# Patient Record
Sex: Male | Born: 1935 | Race: Black or African American | Hispanic: No | Smoking: Former smoker
Health system: Southern US, Community
[De-identification: ages and names within clinical notes are randomized; demographics above are authoritative.]

## PROBLEM LIST (undated history)

## (undated) DIAGNOSIS — I10 Essential (primary) hypertension: Secondary | ICD-10-CM

## (undated) DIAGNOSIS — R339 Retention of urine, unspecified: Secondary | ICD-10-CM

## (undated) HISTORY — PX: FRACTURE SURGERY: SHX138

---

## 2002-12-05 ENCOUNTER — Emergency Department (HOSPITAL_COMMUNITY): Admission: EM | Admit: 2002-12-05 | Discharge: 2002-12-05 | Payer: Self-pay | Admitting: Emergency Medicine

## 2004-08-13 ENCOUNTER — Ambulatory Visit: Payer: Self-pay | Admitting: Internal Medicine

## 2004-09-06 ENCOUNTER — Ambulatory Visit: Payer: Self-pay | Admitting: Internal Medicine

## 2005-08-22 ENCOUNTER — Ambulatory Visit: Payer: Self-pay | Admitting: Internal Medicine

## 2006-10-06 ENCOUNTER — Ambulatory Visit: Payer: Self-pay | Admitting: Internal Medicine

## 2006-10-06 LAB — CONVERTED CEMR LAB
CO2: 27 meq/L (ref 19–32)
Calcium: 9.8 mg/dL (ref 8.4–10.5)
Chloride: 102 meq/L (ref 96–112)
Creatinine, Ser: 1.4 mg/dL (ref 0.4–1.5)
GFR calc Af Amer: 62 mL/min
Glucose, Bld: 82 mg/dL (ref 70–99)

## 2006-12-05 ENCOUNTER — Inpatient Hospital Stay (HOSPITAL_COMMUNITY): Admission: EM | Admit: 2006-12-05 | Discharge: 2006-12-16 | Payer: Self-pay | Admitting: Emergency Medicine

## 2006-12-09 ENCOUNTER — Ambulatory Visit: Payer: Self-pay | Admitting: Internal Medicine

## 2007-10-03 ENCOUNTER — Encounter: Payer: Self-pay | Admitting: *Deleted

## 2007-10-03 DIAGNOSIS — I10 Essential (primary) hypertension: Secondary | ICD-10-CM | POA: Insufficient documentation

## 2007-10-03 DIAGNOSIS — IMO0002 Reserved for concepts with insufficient information to code with codable children: Secondary | ICD-10-CM | POA: Insufficient documentation

## 2007-10-03 DIAGNOSIS — M1710 Unilateral primary osteoarthritis, unspecified knee: Secondary | ICD-10-CM

## 2007-10-03 DIAGNOSIS — Z9889 Other specified postprocedural states: Secondary | ICD-10-CM | POA: Insufficient documentation

## 2008-01-22 ENCOUNTER — Ambulatory Visit: Payer: Self-pay | Admitting: Internal Medicine

## 2008-01-22 DIAGNOSIS — S72009A Fracture of unspecified part of neck of unspecified femur, initial encounter for closed fracture: Secondary | ICD-10-CM | POA: Insufficient documentation

## 2008-01-22 DIAGNOSIS — H6120 Impacted cerumen, unspecified ear: Secondary | ICD-10-CM

## 2008-01-22 LAB — CONVERTED CEMR LAB
Basophils Absolute: 0 10*3/uL (ref 0.0–0.1)
Basophils Relative: 0.4 % (ref 0.0–1.0)
Calcium: 9.5 mg/dL (ref 8.4–10.5)
Creatinine, Ser: 1.5 mg/dL (ref 0.4–1.5)
Eosinophils Absolute: 0.1 10*3/uL (ref 0.0–0.7)
GFR calc Af Amer: 57 mL/min
HCT: 37.5 % — ABNORMAL LOW (ref 39.0–52.0)
Hemoglobin: 12.6 g/dL — ABNORMAL LOW (ref 13.0–17.0)
MCHC: 33.6 g/dL (ref 30.0–36.0)
MCV: 93.7 fL (ref 78.0–100.0)
Monocytes Absolute: 0.6 10*3/uL (ref 0.1–1.0)
Neutro Abs: 3.6 10*3/uL (ref 1.4–7.7)
RBC: 4 M/uL — ABNORMAL LOW (ref 4.22–5.81)
RDW: 13.6 % (ref 11.5–14.6)
Sodium: 139 meq/L (ref 135–145)

## 2008-01-26 ENCOUNTER — Encounter: Payer: Self-pay | Admitting: Internal Medicine

## 2008-05-03 ENCOUNTER — Encounter: Payer: Self-pay | Admitting: Internal Medicine

## 2008-05-05 ENCOUNTER — Telehealth: Payer: Self-pay | Admitting: Internal Medicine

## 2008-11-23 ENCOUNTER — Telehealth: Payer: Self-pay | Admitting: Internal Medicine

## 2009-02-07 ENCOUNTER — Telehealth: Payer: Self-pay | Admitting: Internal Medicine

## 2009-07-27 ENCOUNTER — Ambulatory Visit: Payer: Self-pay | Admitting: Internal Medicine

## 2009-07-27 DIAGNOSIS — R634 Abnormal weight loss: Secondary | ICD-10-CM

## 2009-07-27 LAB — CONVERTED CEMR LAB
Basophils Absolute: 0 10*3/uL (ref 0.0–0.1)
Calcium: 10.4 mg/dL (ref 8.4–10.5)
Eosinophils Relative: 1.4 % (ref 0.0–5.0)
GFR calc non Af Amer: 73.18 mL/min (ref >60)
Glucose, Bld: 90 mg/dL (ref 70–99)
HCT: 37.1 % — ABNORMAL LOW (ref 39.0–52.0)
Hemoglobin: 12.4 g/dL — ABNORMAL LOW (ref 13.0–17.0)
Lymphocytes Relative: 19.9 % (ref 12.0–46.0)
Lymphs Abs: 1.2 10*3/uL (ref 0.7–4.0)
Monocytes Relative: 8.8 % (ref 3.0–12.0)
Neutro Abs: 4.1 10*3/uL (ref 1.4–7.7)
Platelets: 271 10*3/uL (ref 150.0–400.0)
Potassium: 4.6 meq/L (ref 3.5–5.1)
RDW: 13.6 % (ref 11.5–14.6)
Sodium: 139 meq/L (ref 135–145)
TSH: 1.45 microintl units/mL (ref 0.35–5.50)
WBC: 5.9 10*3/uL (ref 4.5–10.5)

## 2009-08-25 ENCOUNTER — Telehealth: Payer: Self-pay | Admitting: Internal Medicine

## 2009-09-14 ENCOUNTER — Encounter: Payer: Self-pay | Admitting: Internal Medicine

## 2010-08-02 ENCOUNTER — Ambulatory Visit: Admit: 2010-08-02 | Payer: Self-pay | Admitting: Internal Medicine

## 2010-08-28 NOTE — Progress Notes (Signed)
Summary: AVAPRO  Phone Note Call from Patient   Summary of Call: Pt left vm that he dropped off a letter regarding one of his rx's. Have you see this?  Initial call taken by: Lamar Sprinkles, CMA,  August 25, 2009 3:05 PM  Follow-up for Phone Call        Nope Follow-up by: Jacques Navy MD,  August 25, 2009 3:35 PM  Additional Follow-up for Phone Call Additional follow up Details #1::        Per pt, he dropped off a letter that states a medcation was not covered. Must be avapro, need to call pharm and confirm if med needs PA..................Marland KitchenLamar Sprinkles, CMA  August 25, 2009 6:23 PM     Additional Follow-up for Phone Call Additional follow up Details #2::    left message on voicemail for pharmacy to send me the PA/rejected clain for Avapro.Lucious Groves,  August 28, 2009 11:40 AM  I rec'd fax that patient plan prefers Losartan, Benicar, and Diovan. Would you like to change meds or initiate PA? Please advise. Lucious Groves  August 29, 2009 8:57 AM   Additional Follow-up for Phone Call Additional follow up Details #3:: Details for Additional Follow-up Action Taken: wilol change to losartan 50mg  once daily , # 30 , refill as needed   pt informed in detatil. Margaret Pyle, CMA  August 29, 2009 10:31 AM  Additional Follow-up by: Jacques Navy MD,  August 29, 2009 9:01 AM  New/Updated Medications: LOSARTAN POTASSIUM 50 MG TABS (LOSARTAN POTASSIUM) 1 by mouth once daily Prescriptions: LOSARTAN POTASSIUM 50 MG TABS (LOSARTAN POTASSIUM) 1 by mouth once daily  #30 x 11   Entered by:   Margaret Pyle, CMA   Authorized by:   Jacques Navy MD   Signed by:   Margaret Pyle, CMA on 08/29/2009   Method used:   Faxed to ...       [facility] [address] [phone]* (retail)       [address]       [place], [place]  [address]       Ph: [phone] or [phone]       Fax: [phone]   RxID:   [id number]

## 2010-08-28 NOTE — Medication Information (Signed)
Summary: Alternative/AARP MedicareComplete  Alternative/AARP MedicareComplete   Imported By: Lester  10/11/2009 08:39:10  _____________________________________________________________________  External Attachment:    Type:   Image     Comment:   External Document

## 2010-09-13 ENCOUNTER — Encounter (INDEPENDENT_AMBULATORY_CARE_PROVIDER_SITE_OTHER): Payer: Self-pay | Admitting: *Deleted

## 2010-09-13 ENCOUNTER — Encounter: Payer: Self-pay | Admitting: Internal Medicine

## 2010-09-13 ENCOUNTER — Ambulatory Visit (INDEPENDENT_AMBULATORY_CARE_PROVIDER_SITE_OTHER): Payer: Medicare Other | Admitting: Internal Medicine

## 2010-09-13 ENCOUNTER — Other Ambulatory Visit: Payer: Medicare Other

## 2010-09-13 ENCOUNTER — Other Ambulatory Visit: Payer: Self-pay | Admitting: Internal Medicine

## 2010-09-13 DIAGNOSIS — M1710 Unilateral primary osteoarthritis, unspecified knee: Secondary | ICD-10-CM

## 2010-09-13 DIAGNOSIS — Z0000 Encounter for general adult medical examination without abnormal findings: Secondary | ICD-10-CM

## 2010-09-13 DIAGNOSIS — I10 Essential (primary) hypertension: Secondary | ICD-10-CM

## 2010-09-13 DIAGNOSIS — S72009A Fracture of unspecified part of neck of unspecified femur, initial encounter for closed fracture: Secondary | ICD-10-CM

## 2010-09-13 DIAGNOSIS — R634 Abnormal weight loss: Secondary | ICD-10-CM

## 2010-09-13 LAB — TSH: TSH: 1.01 u[IU]/mL (ref 0.35–5.50)

## 2010-09-13 LAB — CBC WITH DIFFERENTIAL/PLATELET
Basophils Relative: 0.4 % (ref 0.0–3.0)
Eosinophils Relative: 1.9 % (ref 0.0–5.0)
HCT: 37.7 % — ABNORMAL LOW (ref 39.0–52.0)
Hemoglobin: 12.8 g/dL — ABNORMAL LOW (ref 13.0–17.0)
Lymphs Abs: 1.3 10*3/uL (ref 0.7–4.0)
MCV: 92.6 fl (ref 78.0–100.0)
Monocytes Absolute: 0.4 10*3/uL (ref 0.1–1.0)
Monocytes Relative: 6.2 % (ref 3.0–12.0)
Neutro Abs: 4.4 10*3/uL (ref 1.4–7.7)
RBC: 4.07 Mil/uL — ABNORMAL LOW (ref 4.22–5.81)
WBC: 6.2 10*3/uL (ref 4.5–10.5)

## 2010-09-13 LAB — BASIC METABOLIC PANEL
CO2: 27 mEq/L (ref 19–32)
Chloride: 104 mEq/L (ref 96–112)
Potassium: 4.6 mEq/L (ref 3.5–5.1)

## 2010-09-19 NOTE — Assessment & Plan Note (Signed)
Summary: yearly medicare wellness exam-lb   Vital Signs:  Patient profile:   77 year old male Height:      71.25 inches Weight:      148.25 pounds BMI:     20.61 Temp:     97.6 degrees F oral Pulse rate:   96 / minute Resp:     12 per minute BP sitting:   162 / 98  (left arm)  Vitals Entered By: [name] Bennett CMA(AAMA) (September 13, 2010 10:40 AM) CC: Pt here for yearly medicare wellness exam Is Patient Diabetic? No   Primary Care Provider:  Jacques Navy MD  CC:  Pt here for yearly medicare wellness exam.  History of Present Illness: [name] Bennett presents for medicare wellness exam. He has been doing well in the interval since his last visit Dec '10. He has had no major illness or injury or surgery.  He remains independent in his ADLs - lives alone and cares for himself. He has had no falls and has made a complete recovery from [facility]. He is in good spirits with no depression. He manages his social secruity, his bills and remains mentally active - corssword puzzles, runs errands, etc.   Preventive Screening-Counseling & Management  Alcohol-Tobacco     Alcohol drinks/day: 0     Smoking Status: quit > 6 months     Packs/Day: 0.25     Year Quit: 1980     Pipe use/week: occcasional  Caffeine-Diet-Exercise     Caffeine use/day: 1     Diet Comments: very health - home cooking     Does Patient Exercise: yes     Type of exercise: bike/weights     Exercise (avg: min/session): <30     Times/week: 3  Hep-HIV-STD-Contraception     Hepatitis Risk: no risk noted     HIV Risk: no risk noted     STD Risk: no risk noted     Dental Visit-last 6 months no     Sun Exposure-Excessive: yes  Safety-Violence-Falls     Seat Belt Use: yes     Helmet Use: n/a     Firearms in the Home: firearms in the home     Smoke Detectors: yes     Violence in the Home: no risk noted     Sexual Abuse: no     Fall Risk: no      Drug Use:  never.        Blood Transfusions:  no.    Current  Medications (verified): 1)  Aspirin 325 Mg  Tabs (Aspirin) .... Take One Tablet Once Daily 2)  Losartan Potassium 50 Mg Tabs (Losartan Potassium) .Marland Kitchen.. 1 By Mouth Once Daily 3)  Vitamin C .... Take One Tablet Once Daily 4)  Furosemide 40 Mg  Tabs (Furosemide) .... Take One Tablet Once Daily  Allergies (verified): No Known Drug Allergies  Past History:  Past Medical History: Last updated: 01/22/2008 * Hx of BROKEN RIGHT SHOULDER HYPERTENSION, MILD (ICD-401.1) DEGENERATIVE JOINT DISEASE, KNEES, BILATERAL (ICD-715.96) Hx of PNEUMONIA AT AGE 85 (ICD-486) FRacture right hip - s/p ORIF may '08  Past Surgical History: Last updated: 01/22/2008 HERNIORRHAPHY, HX OF (ICD-V45.89) ORIF right hip May '08  Social History: Last updated: 07/27/2009 HSG Married '47-widowed '97 [employer] - 3 years - served in [place], [place] Raised - 15 foster care children Lives alone Remains very active.   Social History: Smoking Status:  quit > 6 months Packs/Day:  0.25 Caffeine use/day:  1 Does Patient Exercise:  yes Dental Care w/in 6 mos.:  no Seat Belt Use:  yes Fall Risk:  no Hepatitis Risk:  no risk noted HIV Risk:  no risk noted STD Risk:  no risk noted Sun Exposure-Excessive:  yes Drug Use:  never Blood Transfusions:  no  Review of Systems  The patient denies anorexia, fever, weight loss, weight gain, vision loss, decreased hearing, chest pain, dyspnea on exertion, prolonged cough, abdominal pain, severe indigestion/heartburn, incontinence, muscle weakness, difficulty walking, depression, enlarged lymph nodes, and angioedema.    Physical Exam  General:  Tall and thin elderly AA man who looks younger than his 89 years.  Head:  Mild temporal wasting. Eyes:  arcus senilis noted. Lenses clear. Mouth:  no lesions Neck:  supple.   Lungs:  normal respiratory effort, normal breath sounds, no crackles, and no wheezes.   Heart:  normal rate and regular rhythm.     Abdomen:  soft and normal bowel sounds.   Msk:  normal ROM, no joint tenderness, no joint swelling, no redness over joints, and no joint instability.   Pulses:  2+ radial Neurologic:  alert & oriented X3, cranial nerves II-XII intact, strength normal in all extremities, and gait normal.   Skin:  turgor normal, color normal, no rashes, and no suspicious lesions.   Cervical Nodes:  no anterior cervical adenopathy and no posterior cervical adenopathy.   Psych:  Oriented X3, memory intact for recent and remote, normally interactive, and good eye contact.     Impression & Recommendations:  Problem # 1:  HIP FRACTURE, RIGHT (ICD-820.8) full recovery with no limitations in activity  Problem # 2:  WEIGHT LOSS (ICD-783.21) Weight has been stable over the past 12 months  Problem # 3:  HYPERTENSION, MILD (ICD-401.1)  His updated medication list for this problem includes:    Losartan Potassium 50 Mg Tabs (Losartan potassium) .Marland Kitchen... 1 by mouth once daily    Furosemide 40 Mg Tabs (Furosemide) .Marland Kitchen... Take one tablet once daily  Orders: TLB-BMP (Basic Metabolic Panel-BMET) (80048-METABOL) TLB-TSH (Thyroid Stimulating Hormone) (84443-TSH)  BP today: 162/98 Prior BP: 142/80 (07/27/2009)  Poor control at today's visit but generally adequately controlled based on chart review.  Plan - continue present medications.           periodic BP check at home or in the office.  Problem # 4:  Preventive Health Care (ICD-V70.0)  Interval history is unremarkable. Physical exam without significant abnormality. Lab results are in normal limits. He is aged out of need for colorectal cancer or prostate cancer screening.   In summary- a delightful man who appears to be medically stable. He remains very active helping his friends, family and community. 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 He will return as needed or 1 year.  Orders: Medicare -1st Annual Wellness Visit [phone])  Complete Medication  List: 1)  Aspirin 325 Mg Tabs (Aspirin) .... Take one tablet once daily 2)  Losartan Potassium 50 Mg Tabs (Losartan potassium) .Marland Kitchen.. 1 by mouth once daily 3)  Vitamin C  .... Take one tablet once daily 4)  Furosemide 40 Mg Tabs (Furosemide) .... Take one tablet once daily  Other Orders: TLB-CBC Platelet - w/Differential (85025-CBCD)  Patient: [name] Bennett Note: All result statuses are Final unless otherwise noted.  Tests: (1) BMP (METABOL)   Sodium                    140 mEq/L  135-145   Potassium                 4.6 mEq/L                   3.5-5.1   Chloride                  104 mEq/L                   96-112   Carbon Dioxide            27 mEq/L                    19-32   Glucose                   77 mg/dL                    16-10   BUN                       22 mg/dL                    9-60   Creatinine                1.1 mg/dL                   4.5-4.0   Calcium                   10.2 mg/dL                  9.8-11.9   GFR                       80.71 mL/min                >60.00  Tests: (2) TSH (TSH)   FastTSH                   1.01 uIU/mL                 0.35-5.50  Tests: (3) CBC Platelet w/Diff (CBCD)   White Cell Count          6.2 K/uL                    4.5-10.5   Red Cell Count       [L]  4.07 Mil/uL                 4.22-5.81   Hemoglobin           [L]  12.8 g/dL                   14.7-82.9   Hematocrit           [L]  37.7 %                      39.0-52.0   MCV                       92.6 fl                     78.0-100.0   MCHC                      34.0  g/dL                   16.1-09.6   RDW                       14.0 %                      11.5-14.6   Platelet Count            254.0 K/uL                  150.0-400.0   Neutrophil %              70.5 %                      43.0-77.0   Lymphocyte %              21.0 %                      12.0-46.0   Monocyte %                6.2 %                       3.0-12.0   Eosinophils%              1.9 %                        0.0-5.0   Basophils %               0.4 %                       0.0-3.0   Neutrophill Absolute      4.4 K/uL                    1.4-7.7   Lymphocyte Absolute       1.3 K/uL                    0.7-4.0   Monocyte Absolute         0.4 K/uL                    0.1-1.0  Eosinophils, Absolute                             0.1 K/uL                    0.0-0.7   Basophils Absolute        0.0 K/uL                    0.0-0.1Prescriptions: FUROSEMIDE 40 MG  TABS (FUROSEMIDE) Take one tablet once daily  #30 Each x 5   Entered and Authorized by:   [name] Navy MD   Signed by:   [name] Navy MD on 09/13/2010   Method used:   Electronically to        [facility] [address] [phone]* (retail)       [address]       [place], [place]  [address]       Ph: [phone] or [phone]  Fax: [phone]   RxID:   [id number] HYQMVHQI POTASSIUM 50 MG TABS (LOSARTAN POTASSIUM) 1 by mouth once daily  #30 x 11   Entered and Authorized by:   [name] Navy MD   Signed by:   [name] Navy MD on 09/13/2010   Method used:   Electronically to        [facility] [address] [phone]* (retail)       [address]       [place], [place]  [address]       Ph: [phone] or [phone]       Fax: [phone]   RxID:   [id number]    Orders Added: 1)  TLB-BMP (Basic Metabolic Panel-BMET) [80048-METABOL] 2)  TLB-TSH (Thyroid Stimulating Hormone) [84443-TSH] 3)  TLB-CBC Platelet - w/Differential [85025-CBCD] 4)  Est. Patient Level III [30160] 5)  Medicare -1st Annual Wellness Visit [G0438]

## 2010-12-11 NOTE — Op Note (Signed)
NAMELAFAYETTE, [name] Bennett              ACCOUNT NO.:  [id number]   MEDICAL RECORD NO.:  [id number]          PATIENT TYPE:  INP   LOCATION:  5008                         FACILITY:  [facility]   PHYSICIAN:  Madlyn Frankel. Charlann Boxer, M.D.  DATE OF BIRTH:  [birth year]   DATE OF PROCEDURE:  12/08/2006  DATE OF DISCHARGE:                               OPERATIVE REPORT   PREOPERATIVE DIAGNOSIS:  Right displaced femoral neck fracture.   POSTOPERATIVE DIAGNOSIS:  Right displaced femoral neck fracture.   PROCEDURE:  Right hip hemiarthroplasty cemented using a Summit size 6  stem with a standard offset neck and a 49 bipolar ball with a 28 plus  1.5 inner ball.   SURGEON:  Madlyn Frankel. Charlann Boxer, M.D.   ASSISTANT:  [name]. Mann, PA.   ANESTHESIA:  General.   ESTIMATED BLOOD LOSS:  300 mL.   DRAINS:  None.   COMPLICATIONS:  None.   INDICATIONS FOR PROCEDURE:  The patient is a pleasant 77 year old  gentleman  who resides by himself who fell on Friday.  He was brought to  the emergency room where radiographs revealed fracture.  He was admitted  by one of my partners who set him up and asked for my surgical  assistance for fixation of his hip.  I reviewed with the patient his  radiographs, the risks and benefits of the procedure including  infection, dislocation, DVT, need for revision surgery.  The patient is  a very healthy and active individual who hopefully will return to his  normal  state of health postoperatively.  Consent obtained.   PROCEDURE IN DETAIL:  The patient was brought to the operative theater.  Once adequate anesthesia and preoperative antibiotics, 2 grams of Ancef  administered, the patient was positioned in the left lateral decubitus  position with the left side up.  The left lower extremity was then  prepped and draped in a sterile fashion.  Standard lateral base incision  was made for a posterior approach to the hip.  The short external  rotators were identified and taken down from the  posterior capsule.  An  L-capsulotomy was made saving the posterior and superior leaflets for  later anatomic repair as well as using the posterior leaflet for  protection against the sciatic nerve.   The fracture site was identified and noted to be more of a subcapital  pattern.  A neck osteotomy was then made into the trochanteric fossa.   At this point I was able to remove the femoral head. I measured it to be  a 49-mm head.  At this point, attention was directed to the femur.  Femoral preparation was carried out per protocol for this system with  the use of a starting reamer followed by hand reamer then irrigating the  canal to prevent fat emboli.  I then began broaching with a size 3  broach and carried this all the way up to a size 6 with an excellent  initial fit for this system.  At this point a trial reduction was  carried out.  Initial attempts with a high offset neck found it  was very  difficult to reduce the hip indicating too much offset. I went ahead and  used the standard offset neck.  At this point I was able to reduce the  hip and found the hip to be very stable throughout range of motion.  Leg  lengths appeared equal to the down side with the legs compared to the  down left lower extremity.  Given all of these findings, the final  components were chosen.  I went ahead and removed the broach, placed a  cement restricter deep and then irrigated the canal with the canal  brush.  The canal was then packed with sponges while the cement was  mixed.  Once the final components were brought into the field, cement  was mixed on the back table.  It was then injected into the canal in a  retrograde fashion.  A final size 6 cemented stem with a standard neck  offset was then placed into the canal by hand at 20 degrees of  anteversion where the trial broach was placed.  Removal of cement was  carried out as extruded cement was present.  Once the cement had cured,  excessive cement  was again debrided.  I examined the acetabulum to make  sure there was no loose debris.  Once I was satisfied there was no  debris, I impacted the 49 bipolar ball with a 28 plus 1.5 inner ball  into a clean and dry joint and then the hip reduced.  I irrigated the  hip again, checked for range of motion and was very satisfied with the  range of motion.  The capsule was then reapproximated superiorly using 0  Ethibond.  The iliotibial band was reapproximated using 0 Ethibond and 0  Vicryl was used to run on the gluteal fascia.  The remainder of the  wound was closed with 2-0 Vicryl and a 4-0 running Monocryl on the subcu  layer.  The skin was then cleaned, dried and dressed sterilely with  Steri-Strips, dressings, sponges and dressing.  The patient was then  extubated and taken to the recovery room in stable condition with a knee  immobilizer placed to the right knee.  Leg lengths in the supine  position were equal.      Madlyn Frankel. Charlann Boxer, M.D.  Electronically Signed     MDO/MEDQ  D:  12/08/2006  T:  12/08/2006  Job:  [id number]

## 2010-12-11 NOTE — Discharge Summary (Signed)
[name], Bennett              ACCOUNT NO.:  [id number]   MEDICAL RECORD NO.:  [id number]          PATIENT TYPE:  INP   LOCATION:  5008                         FACILITY:  [facility]   PHYSICIAN:  Madlyn Frankel. Charlann Boxer, M.D.  DATE OF BIRTH:  [birth year]   DATE OF ADMISSION:  12/05/2006  DATE OF DISCHARGE:  12/12/2006                               DISCHARGE SUMMARY   PRINCIPAL DIAGNOSIS:  Displaced right femoral neck fracture.   SECONDARY DIAGNOSIS:  Hypertension.   PROCEDURES:  On Dec 08, 2006, the patient underwent a right cemented hip  hemiarthroplasty by Dr. Shelda Pal.   BRIEF HISTORY OF PRESENT ILLNESS:  [name] Bennett is a very pleasant 77-year-  old gentleman who was at work when he fell sustaining a right femoral  neck fracture.  He was initially seen and evaluation and admitted by one  of my partner, Dr. Venita Lick.  He was placed on Lovenox, consulted  by Medicine, with plan for me to fix his hip.  He was seen and evaluated  by Medicine.   BRIEF HOSPITAL COURSE:  The patient was admitted on Dec 05, 2006 as  noted.  He was seen and evaluated by Medicine on Dec 08, 2006.  He was  noted to have a urinary tract infection and placed initially on  Rocephin, eventually switched to Ceftin.   He was placed on Lovenox until his surgical date on Dec 08, 2006.  On  that date, he underwent a right cemented hip arthroplasty that was  uncomplicated.  He was placed on postoperative Lovenox, sequential  compression device and early activity for his DVT prophylaxis.   He was on May 13 switched from Rocephin to Ceftin.  His hospital course  was unremarkable.  He was seen and consulted by physical therapy and  occupational therapy.  There was recommendation based on the fact that  he lives alone for short-term SNF.  He progressed very well through  therapy without complication, being mindful of his hip precaution.   On Dec 11, 2006, he was prepared for discharge the next day to short-  tern  SNF.  He does have a sister that currently resides at [facility] with the plan for him to spend time there prior to discharge to  his previous living arrangement.   This was a Worker's Comp case and the case managers have been involved.  We will then plan to follow up with him in routine follow-up.  I will  need to see him back in two weeks postoperatively for routine follow-up.   DISCHARGE DIAGNOSES:  1. Right displaced femoral neck fracture.  2. Hypertension.  3. Urinary tract infection.   POSTOPERATIVE MEDICATIONS:  1. Vicodin 5/325, 1-2 tablets p.o. q.4-6h. p.r.n. for pain.  2. Robaxin 500 mg p.o. q.i.d. p.r.n. for muscle spasms and pain.  3. Lovenox 40 mg subcu daily x10 days.  4. Enteric-coated aspirin 325 mg p.o. daily to start after last      Lovenox injection for 4 weeks.  He is to take Ceftin 250 mg p.o.      b.i.d. for  2 days and then off.  5. He should Colace 100 mg p.o. b.i.d. for constipation while on pain      meds.  6. MiraLax 17 mg p.o. daily for severe constipation.  7. Iron 325 p.o. t.i.d. x3 weeks, then off.   He should then arrange for a postoperative follow-up with his primary  care physician for overall management of his hypertension postop.   Please note too that he had a Foley placed in the hospital, and on Dec 10, 2006, he had to be in and out cathed to help with urinary outflow.  If there are problems with this, this may be addressed as an outpatient  as well.      Madlyn Frankel Charlann Boxer, M.D.  Electronically Signed     MDO/MEDQ  D:  12/11/2006  T:  12/11/2006  Job:  [id number]

## 2010-12-11 NOTE — H&P (Signed)
NAMEJACOBB, ALEN              ACCOUNT NO.:  [id number]   MEDICAL RECORD NO.:  [id number]          PATIENT TYPE:  INP   LOCATION:  5008                         FACILITY:  [facility]   PHYSICIAN:  Alvy Beal, MD    DATE OF BIRTH:  Feb 17, 1918   DATE OF ADMISSION:  12/05/2006  DATE OF DISCHARGE:                              HISTORY & PHYSICAL   REASON FOR ADMISSION:  Right hip fracture.   HISTORY:  [name] Bennett is a very pleasant, very active 77 year old gentleman  who was at work today when he slipped and fell and injured his right  hip.  He was brought to the emergency room where x-rays were done and  diagnosed him with a right subcapital femoral neck fracture.  As a  result, I was consulted for further treatment and evaluation.   The patient currently is awake and alert.  He denied any headaches,  blurry vision, loss of consciousness or dizziness prior to or following  his fall.  His only complaint, at this point in time, is right hip pain.   PAST MEDICAL HISTORY:  Includes:  1. Hypertension.  2. A hernia.  He has had a hernia repair.   He is nonsmoker, nondrinker and he denies any use of social drugs.  He  is, otherwise, healthy with no significant other medical issues other  than the hypertension.   He is currently on the floor, admitted to me.  His only complaint, at  this point in time, is right hip pain.   CLINICAL EXAM:  GENERAL:  He is alert and oriented x3.  EXTREMITIES:  He has intact peripheral pulses throughout in both lower  extremities.  Capillary refill is less than 2 seconds in all digits.  He  has palpable hip pain with palpation on the right side.  Left side is  asymptomatic.  He has no knee swelling or tenderness or ankle swelling  and tenderness.  ABDOMEN:  Soft and nontender.  LUNGS:  Clear to auscultation.  HEART:  Regular rate and rhythm.  He has no shortness of breath or chest  pain.   His labs are essentially unremarkable.  His UA does demonstrate a  large  amount of leukocytes and is obviously discolored.  This was a clean  catch.  As result, a Foley will be placed and he will be started on  appropriate antibiotics.   At this point in time, I have discussed the patient's x-rays with him  and his nieces.  He has a subcapital femoral neck fracture, which will  require a hemiarthroplasty.  I have discussed this with Dr. Cornelius Moras our hip  specialist and he will be doing the surgery on Monday.  In the meantime,  I will admit the patient, keep him on Lovenox for DVT prophylaxis, as  well as [place] traction.  We will start him on appropriate antibiotics  for the urinary tract infection  and we will provide appropriate pain medication.  If his blood pressure  becomes an issue, then we will consult the hospitalist to help [place] manage  his high blood pressure.  His nieces  will bring his medications in so  that we can determine what they are and restart his antihypertensives.      Alvy Beal, MD  Electronically Signed     DDB/MEDQ  D:  12/05/2006  T:  12/05/2006  Job:  [id number]

## 2010-12-11 NOTE — Discharge Summary (Signed)
NAMEALEJO, BEAMER              ACCOUNT NO.:  [id number]   MEDICAL RECORD NO.:  [id number]          PATIENT TYPE:  INP   LOCATION:  5008                         FACILITY:  [facility]   PHYSICIAN:  Madlyn Frankel. Charlann Boxer, M.D.  DATE OF BIRTH:  05/08/1918   DATE OF ADMISSION:  12/05/2006  DATE OF DISCHARGE:                               DISCHARGE SUMMARY   ADDENDUM:  Mr. Callicott is an 77 year old gentleman with planned discharge  for today pending Worker's Comp approval for discharge to a skilled  nursing facility given an 77 year old gentleman with a right hip  fracture who lives alone.   Since the last dictated discharge summary 12/11/2006 there has been  noted problems with voiding requiring in-and-out catheterization x3 and  thus resulting in reinsertion of a catheter.  At the time of this  dictation the plan is to consult urology for recommendations at time of  discharge.  These will be noted in our discharge orders.   At this point the plan is still to obtain Worker's Comp approval for  skilled nursing facility.  He will otherwise continue to work with  physical therapy prior to the time of discharge with weightbearing as  tolerated activity being mindful of hip precautions.  Medications  unchanged at this point.      Madlyn Frankel Charlann Boxer, M.D.  Electronically Signed     MDO/MEDQ  D:  12/12/2006  T:  12/12/2006  Job:  [id number]

## 2010-12-11 NOTE — Discharge Summary (Signed)
[name] Bennett, [name] Bennett              ACCOUNT NO.:  [id number]   MEDICAL RECORD NO.:  [id number]          PATIENT TYPE:  INP   LOCATION:  5008                         FACILITY:  [facility]   PHYSICIAN:  Madlyn Frankel. Charlann Boxer, M.D.  DATE OF BIRTH:  [birth year]   DATE OF ADMISSION:  12/05/2006  DATE OF DISCHARGE:  12/16/2006                               DISCHARGE SUMMARY   ADDENDUM:  [name] has remained in the hospital due to difficulties with voiding  since the 16th of May.  On multiple occasions, he has had to be in and  out cath and has had to have Foley catheter replaced.  He has continued  to make urine.  Has been more related to obstruction.  He has been  orthopedically continued to be very stable throughout.  Continue DVT  prophylaxis.  The only thing that we have added, at this point, is  Flomax 0.4 mg, which he is going to take on a daily basis.   DISCHARGE MEDICATION ADDITIONS:  1. Flomax 0.4 mg p.o. daily.  2. Lovenox 40 mg subcu q.24 x5 additional days.   DISCHARGE SPECIAL INSTRUCTIONS:  1. Patient instructed he will need to follow up with [name] at      [phone] in 1 week to remove the Foley catheter.  2. He is going to maintain his Foley catheter in until he sees Dr.      [name].  He will provided with a leg bag with full instructions on      how to care for and empty this.  3. He is going home.  He is going to need some home health PT, as well      as some Lovenox administration.  He should be outfitted with a      rolling walker before he leaves.     ______________________________  [name] [name], [place]      Madlyn Frankel. Charlann Boxer, M.D.  Electronically Signed    BLM/MEDQ  D:  12/16/2006  T:  12/16/2006  Job:  [id number]

## 2010-12-14 NOTE — Assessment & Plan Note (Signed)
[facility]                           PRIMARY CARE OFFICE NOTE   ISSIAC, JAMAR                     MRN:          [id number]  DATE:10/06/2006                            DOB:          05/13/1918    Mr. [name] Bennett is a delightful 77 year old African-American gentleman who  presents for a followup evaluation and exam.  He was last seen August 22, 2005.  In the interval he has been healthy.  He reports he is  feeling well and has no active medical complaints.   PAST MEDICAL HISTORY:  Surgical:  Hernia repair in the 1980s.   Medical:  1. Usual childhood disease.  2. Pneumonia at age 77.  3. DJD of the knees.  4. Broken right shoulder in the 1990s.  5. Mild hypertension.   CURRENT MEDICATIONS:  1. Avapro 150 mg daily.  2. Hydrochlorothiazide 25 mg daily.   FAMILY HISTORY:  Noncontributory.   SOCIAL HISTORY:  The patient was married for 50 years.  He has been a  widower since approximately 2000.  He has no children.  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  He reports he continues to exercise vigorously on his  stationary bike.  He remains very active in his church.  He sings in two  choral groups.  He continues to enjoy cooking, particularly pies and  cakes, and reports that he has recently been baking [place] chocolate  pies which he enjoys thoroughly.  He remains very busy and active, which  is the key to his health.   REVIEW OF SYSTEMS:  Negative for any constitutional, ophthalm, ENT,  cardiovascular, respiratory, GI or GU problems.   EXAMINATION:  VITAL SIGNS:  Temperature was 97.6, blood pressure 151/77,  pulse 79, weight 175, height 6 feet 2 inches.  GENERAL APPEARANCE:  This is a well-nourished, well-developed gentleman  looking younger than his stated chronologic age in no acute distress.  HEENT:  Normocephalic, atraumatic.  EACs and TMs were unremarkable.  Oropharynx  without lesions.  Posterior pharynx was clear.  Conjunctivae  and sclerae were clear.  PERRLA, EOMI.  Funduscopic exam was  unremarkable.  NECK:  Supple without thyromegaly.  NODES:  No adenopathy was noted in the cervical or supraclavicular  regions.  CHEST:  No CVA tenderness.  LUNGS:  Clear to auscultation and percussion.  CARDIOVASCULAR:  Shows 2+ radial pulse.  No JVD or carotid bruits.  He  had a quiet precordium with a regular rate and rhythm without murmurs,  rubs, or gallops.  ABDOMEN:  Soft.  No guarding or rebound.  No organosplenomegaly was  appreciated.  GENITALIA AND RECTAL:  Deferred.  EXTREMITIES:  Without clubbing, cyanosis, edema or deformity.  NEUROLOGIC:  Nonfocal.  SKIN:  Clear.   LABORATORY:  Basic metabolic panel revealed a sodium of 139, potassium  5.4, glucose was 82, BUN 24, creatinine 1.4.  GFR was calculated at 62  mm per minute.   IMPRESSION AND PLAN:  1. Hypertension, borderline control.  At this time will continue the  patient's present medication.  2. Health maintenance.  At age 77 the patient does not need any      additional screening.   IMPRESSION:  This is a delightful 77 year old gentleman who seems to be  in stable medical condition.  He will return to see me on an as-needed  basis.     Rosalyn Gess Norins, MD  Electronically Signed    MEN/MedQ  DD: 10/07/2006  DT: 10/07/2006  Job #: [id number]   cc:   Zenaida Deed

## 2011-04-05 ENCOUNTER — Other Ambulatory Visit: Payer: Self-pay | Admitting: Internal Medicine

## 2011-09-14 ENCOUNTER — Encounter (HOSPITAL_COMMUNITY): Payer: Self-pay

## 2011-09-14 ENCOUNTER — Emergency Department (HOSPITAL_COMMUNITY)
Admission: EM | Admit: 2011-09-14 | Discharge: 2011-09-14 | Disposition: A | Discharge status: Home / Self Care | Payer: Medicare Other | Source: Home / Self Care | Attending: Family Medicine | Admitting: Family Medicine

## 2011-09-14 ENCOUNTER — Inpatient Hospital Stay (HOSPITAL_COMMUNITY)
Admission: EM | Admit: 2011-09-14 | Discharge: 2011-09-16 | DRG: 690 | Disposition: A | Discharge status: Home / Self Care | Payer: Medicare Other | Attending: Internal Medicine | Admitting: Internal Medicine

## 2011-09-14 ENCOUNTER — Encounter (HOSPITAL_COMMUNITY): Payer: Self-pay | Admitting: Emergency Medicine

## 2011-09-14 DIAGNOSIS — Z7982 Long term (current) use of aspirin: Secondary | ICD-10-CM

## 2011-09-14 DIAGNOSIS — IMO0002 Reserved for concepts with insufficient information to code with codable children: Secondary | ICD-10-CM | POA: Diagnosis present

## 2011-09-14 DIAGNOSIS — R310 Gross hematuria: Secondary | ICD-10-CM

## 2011-09-14 DIAGNOSIS — D509 Iron deficiency anemia, unspecified: Secondary | ICD-10-CM | POA: Diagnosis present

## 2011-09-14 DIAGNOSIS — N390 Urinary tract infection, site not specified: Secondary | ICD-10-CM

## 2011-09-14 DIAGNOSIS — N138 Other obstructive and reflux uropathy: Secondary | ICD-10-CM | POA: Diagnosis present

## 2011-09-14 DIAGNOSIS — I10 Essential (primary) hypertension: Secondary | ICD-10-CM | POA: Diagnosis present

## 2011-09-14 DIAGNOSIS — K5909 Other constipation: Secondary | ICD-10-CM | POA: Diagnosis present

## 2011-09-14 DIAGNOSIS — N3090 Cystitis, unspecified without hematuria: Principal | ICD-10-CM | POA: Diagnosis present

## 2011-09-14 DIAGNOSIS — R319 Hematuria, unspecified: Secondary | ICD-10-CM | POA: Diagnosis present

## 2011-09-14 DIAGNOSIS — M1710 Unilateral primary osteoarthritis, unspecified knee: Secondary | ICD-10-CM | POA: Diagnosis present

## 2011-09-14 HISTORY — DX: Essential (primary) hypertension: I10

## 2011-09-14 LAB — URINE MICROSCOPIC-ADD ON

## 2011-09-14 LAB — URINALYSIS, ROUTINE W REFLEX MICROSCOPIC
Glucose, UA: 100 mg/dL — AB
Protein, ur: >300 mg/dL — AB

## 2011-09-14 LAB — POCT URINALYSIS DIP (DEVICE)
Glucose, UA: 100 mg/dL — AB
Nitrite: NEGATIVE
Protein, ur: 100 mg/dL — AB
Specific Gravity, Urine: 1.015 (ref 1.005–1.030)
Urobilinogen, UA: 1 mg/dL (ref 0.0–1.0)
pH: 8.5 — ABNORMAL HIGH (ref 5.0–8.0)

## 2011-09-14 LAB — POCT I-STAT, CHEM 8
Calcium, Ion: 1.22 mmol/L (ref 1.12–1.32)
Chloride: 106 mEq/L (ref 96–112)
Creatinine, Ser: 1.2 mg/dL (ref 0.50–1.35)
Glucose, Bld: 104 mg/dL — ABNORMAL HIGH (ref 70–99)
Hemoglobin: 11.2 g/dL — ABNORMAL LOW (ref 13.0–17.0)
Potassium: 4.6 mEq/L (ref 3.5–5.1)

## 2011-09-14 MED ORDER — CIPROFLOXACIN HCL 500 MG PO TABS: 500.0000 mg | ORAL_TABLET | Freq: Two times a day (BID) | ORAL | Status: DC

## 2011-09-14 MED ORDER — LIDOCAINE HCL 2 % EX GEL
CUTANEOUS | Status: AC
  Filled 2011-09-14: qty 20

## 2011-09-14 NOTE — ED Provider Notes (Signed)
History     CSN: [id number]  Arrival date & time 09/14/11  2153   First MD Initiated Contact with Patient 09/14/11 2259      Chief Complaint  Patient presents with  . Urinary Tract Infection    (Consider location/radiation/quality/duration/timing/severity/associated sxs/prior treatment) Patient is a 78 y.o. male presenting with urinary tract infection. The history is provided by the patient.  Urinary Tract Infection This is a new problem. The current episode started 6 to 12 hours ago. The problem occurs constantly. The problem has been gradually worsening. Pertinent negatives include no chest pain, no abdominal pain, no headaches and no shortness of breath. The symptoms are aggravated by nothing. The symptoms are relieved by nothing. He has tried nothing for the symptoms.   patient noticed hematuria this morning and was evaluated at the urgent care. He was diagnosed with UTI and discharged home with a prescription for Cipro. The last time he made urine was at 10 AM this morning. Since that time he has had increased urgency and some suprapubic fullness. He had not noticed any blood clots. No fevers or chills. No nausea or vomiting. No weakness or near syncope. Patient has a remote history of requiring a foley catheter about 8 years ago after hip surgery. He denies any pain or radiation. No back discomfort. Moderate in severity.  Past Medical History  Diagnosis Date  . Hypertension     Past Surgical History  Procedure Date  . Fracture surgery   . Hip surgery     History reviewed. No pertinent family history.  History  Substance Use Topics  . Smoking status: Never Smoker   . Smokeless tobacco: Not on file  . Alcohol Use: No      Review of Systems  Constitutional: Negative for fever and chills.  HENT: Negative for neck pain and neck stiffness.   Eyes: Negative for pain.  Respiratory: Negative for shortness of breath.   Cardiovascular: Negative for chest pain.    Gastrointestinal: Negative for abdominal pain.  Genitourinary: Positive for difficulty urinating. Negative for dysuria and testicular pain.  Musculoskeletal: Negative for back pain.  Skin: Negative for rash.  Neurological: Negative for headaches.  All other systems reviewed and are negative.    Allergies  Review of patient's allergies indicates no known allergies.  Home Medications   Current Outpatient Rx  Name Route Sig Dispense Refill  . ASPIRIN EC 81 MG PO TBEC Oral Take 81 mg by mouth daily.    Marland Kitchen CIPROFLOXACIN HCL 500 MG PO TABS Oral Take 500 mg by mouth every 12 (twelve) hours. Take for 7 days    . FUROSEMIDE 40 MG PO TABS Oral Take 40 mg by mouth daily.       BP 136/64  Pulse 81  Temp(Src) 97.5 F (36.4 C) (Oral)  Resp 20  SpO2 100%  Physical Exam  Constitutional: He is oriented to person, place, and time. He appears well-developed and well-nourished.  HENT:  Head: Normocephalic and atraumatic.  Eyes: EOM are normal. Pupils are equal, round, and reactive to light.  Neck: Trachea normal. Neck supple.  Cardiovascular: Normal rate, regular rhythm, S1 normal, S2 normal and normal pulses.     No systolic murmur is present   No diastolic murmur is present  Pulses:      Radial pulses are 2+ on the right side, and 2+ on the left side.  Pulmonary/Chest: Effort normal and breath sounds normal. He has no wheezes. He has no rhonchi. He has no  rales. He exhibits no tenderness.  Abdominal: Soft. Normal appearance and bowel sounds are normal. There is no rebound, no guarding, no CVA tenderness and negative Murphy's sign.       Mild suprapubic discomfort and fullness.  Genitourinary: Penis normal.  Musculoskeletal: Normal range of motion. He exhibits no edema and no tenderness.  Neurological: He is alert and oriented to person, place, and time. He has normal strength. No cranial nerve deficit or sensory deficit. GCS eye subscore is 4. GCS verbal subscore is 5. GCS motor subscore  is 6.  Skin: Skin is warm and dry. No rash noted. He is not diaphoretic.  Psychiatric: His speech is normal.       Cooperative and appropriate    ED Course  Procedures (including critical care time)  Results for orders placed during the hospital encounter of 09/14/11  URINALYSIS, ROUTINE W REFLEX MICROSCOPIC      Component Value Range   Color, Urine RED (*) YELLOW    APPearance TURBID (*) CLEAR    Specific Gravity, Urine 1.025  1.005 - 1.030    pH 8.0  5.0 - 8.0    Glucose, UA 100 (*) NEGATIVE (mg/dL)   Hgb urine dipstick LARGE (*) NEGATIVE    Bilirubin Urine LARGE (*) NEGATIVE    Ketones, ur 15 (*) NEGATIVE (mg/dL)   Protein, ur >846 (*) NEGATIVE (mg/dL)   Urobilinogen, UA 2.0 (*) 0.0 - 1.0 (mg/dL)   Nitrite POSITIVE (*) NEGATIVE    Leukocytes, UA MODERATE (*) NEGATIVE   URINE MICROSCOPIC-ADD ON      Component Value Range   Squamous Epithelial / LPF RARE  RARE    WBC, UA 21-50  <3 (WBC/hpf)   RBC / HPF 21-50  <3 (RBC/hpf)   Bacteria, UA MANY (*) RARE   POCT I-STAT, CHEM 8      Component Value Range   Sodium 136  135 - 145 (mEq/L)   Potassium 4.6  3.5 - 5.1 (mEq/L)   Chloride 106  96 - 112 (mEq/L)   BUN 39 (*) 6 - 23 (mg/dL)   Creatinine, Ser 9.62  0.50 - 1.35 (mg/dL)   Glucose, Bld 952 (*) 70 - 99 (mg/dL)   Calcium, Ion 8.41  3.24 - 1.32 (mmol/L)   TCO2 25  0 - 100 (mmol/L)   Hemoglobin 11.2 (*) 13.0 - 17.0 (g/dL)   HCT 40.1 (*) 02.7 - 52.0 (%)  CBC      Component Value Range   WBC 6.7  4.0 - 10.5 (K/uL)   RBC 3.59 (*) 4.22 - 5.81 (MIL/uL)   Hemoglobin 10.7 (*) 13.0 - 17.0 (g/dL)   HCT 25.3 (*) 66.4 - 52.0 (%)   MCV 90.0  78.0 - 100.0 (fL)   MCH 29.8  26.0 - 34.0 (pg)   MCHC 33.1  30.0 - 36.0 (g/dL)   RDW 40.3  47.4 - 25.9 (%)   Platelets 269  150 - 400 (K/uL)    12:11 AM Foley placed and blood clots noted. Foley irrigated with cold saline, urine clearing still with some hematuria. Urine culture ordered.  12:54 AM after 4 L of saline flushed, still with  hematuria and clots. Case discussed as above with urologist on call Dr. Mena Goes. He recommends three-way catheter and continue flushing with goal flushed to clear and medical admission.  1:49 AM d/'w triad hospitalist. Will admit. No hypotension and urine clearing.   MDM   Hematuria. Urinary Retention 2/2 blood clots. UTI. Iv ABx. IVFs. T/S. Med  admit.         Sunnie Nielsen, MD 09/15/11 [phone]

## 2011-09-14 NOTE — Discharge Instructions (Signed)
Take medications as directed. Return to care should your symptoms not improve, or worsen in any way, or follow up with your provider as scheduled.

## 2011-09-14 NOTE — ED Notes (Signed)
Pt noticed blood in his urine since Thursday, he has to cath himself sometimes since he had hip surgery in 2006.  States he sometimes goes for days and doesn't have to use the cath.

## 2011-09-14 NOTE — ED Notes (Signed)
Patient complaining of UTI symptoms and urinary retention; patient was seen at Urgent Care earlier this morning -- was told to go home and drink plenty of fluids.  Patient having a hard time urinating; patient states that he has a history of catheter use to urinate; last use four days ago -- patient reports blood in urine.

## 2011-09-14 NOTE — ED Provider Notes (Signed)
History     CSN: [id number]  Arrival date & time 09/14/11  9629   First MD Initiated Contact with Patient 09/14/11 1042      Chief Complaint  Patient presents with  . Urinary Tract Infection    (Consider location/radiation/quality/duration/timing/severity/associated sxs/prior treatment) HPI Comments: [name] Bennett presents today for evaluation of blood in his urine. He reports onset of symptoms. Several days ago. He denies any dysuria, urgency, frequency, or pressure. He does self cath himself and has done so for many years since a hip surgery. He denies any fever, abdominal pain, nausea, vomiting, or confusion. He does report a sterile technique with catheterization.  Patient is a 78 y.o. male presenting with urinary tract infection. The history is provided by the patient and a relative.  Urinary Tract Infection This is a new problem. The current episode started more than 2 days ago. The problem occurs constantly. The problem has not changed since onset.The symptoms are aggravated by nothing. The symptoms are relieved by nothing.    Past Medical History  Diagnosis Date  . Hypertension     Past Surgical History  Procedure Date  . Fracture surgery     History reviewed. No pertinent family history.  History  Substance Use Topics  . Smoking status: Never Smoker   . Smokeless tobacco: Not on file  . Alcohol Use: No      Review of Systems  Constitutional: Negative.   HENT: Negative.   Eyes: Negative.   Respiratory: Negative.   Cardiovascular: Negative.   Gastrointestinal: Negative.   Genitourinary: Positive for hematuria and difficulty urinating. Negative for dysuria, urgency, frequency and flank pain.  Musculoskeletal: Negative.   Skin: Negative.   Neurological: Negative.     Allergies  Review of patient's allergies indicates no known allergies.  Home Medications   Current Outpatient Rx  Name Route Sig Dispense Refill  . CIPROFLOXACIN HCL 500 MG PO TABS Oral Take  1 tablet (500 mg total) by mouth every 12 (twelve) hours. 14 tablet 0  . FUROSEMIDE 40 MG PO TABS  TAKE 1 TABLET BY MOUTH ONCE DAILY 30 tablet 4    BP 156/50  Pulse 85  Temp(Src) 97.7 F (36.5 C) (Oral)  Resp 18  SpO2 100%  Physical Exam  Nursing note and vitals reviewed. Constitutional: He is oriented to person, place, and time. He appears well-developed and well-nourished.  HENT:  Head: Normocephalic and atraumatic.  Eyes: EOM are normal.  Neck: Normal range of motion.  Pulmonary/Chest: Effort normal.  Abdominal: Normal appearance and bowel sounds are normal. There is no tenderness.  Musculoskeletal: Normal range of motion.  Neurological: He is alert and oriented to person, place, and time.  Skin: Skin is warm and dry.  Psychiatric: His behavior is normal.    ED Course  Procedures (including critical care time)  Labs Reviewed  POCT URINALYSIS DIP (DEVICE) - Abnormal; Notable for the following:    Glucose, UA 100 (*)    Bilirubin Urine MODERATE (*)    Hgb urine dipstick LARGE (*)    pH 8.5 (*)    Protein, ur 100 (*)    Leukocytes, UA SMALL (*) Biochemical Testing Only. Please order routine urinalysis from main lab if confirmatory testing is needed.   All other components within normal limits   No results found.   1. UTI (lower urinary tract infection)       MDM  Labs reviewed; rx given for ciprofloxacin 500 mg PO BID x 7 days; has appt with  PCP on Monday       Richardo Priest, MD 09/14/11 1120

## 2011-09-15 ENCOUNTER — Emergency Department (HOSPITAL_COMMUNITY): Payer: Medicare Other

## 2011-09-15 DIAGNOSIS — N138 Other obstructive and reflux uropathy: Secondary | ICD-10-CM | POA: Diagnosis present

## 2011-09-15 DIAGNOSIS — N390 Urinary tract infection, site not specified: Secondary | ICD-10-CM | POA: Diagnosis present

## 2011-09-15 DIAGNOSIS — K5909 Other constipation: Secondary | ICD-10-CM | POA: Diagnosis present

## 2011-09-15 DIAGNOSIS — D509 Iron deficiency anemia, unspecified: Secondary | ICD-10-CM | POA: Diagnosis present

## 2011-09-15 DIAGNOSIS — R319 Hematuria, unspecified: Secondary | ICD-10-CM

## 2011-09-15 LAB — CBC
HCT: 32.3 % — ABNORMAL LOW (ref 39.0–52.0)
HCT: 29.5 % — ABNORMAL LOW (ref 39.0–52.0)
Hemoglobin: 10.7 g/dL — ABNORMAL LOW (ref 13.0–17.0)
Hemoglobin: 9.8 g/dL — ABNORMAL LOW (ref 13.0–17.0)
MCH: 29.8 pg (ref 26.0–34.0)
MCH: 29.5 pg (ref 26.0–34.0)
MCHC: 33.1 g/dL (ref 30.0–36.0)
MCHC: 33.2 g/dL (ref 30.0–36.0)
MCV: 88.9 fL (ref 78.0–100.0)
RBC: 3.32 MIL/uL — ABNORMAL LOW (ref 4.22–5.81)

## 2011-09-15 LAB — BASIC METABOLIC PANEL
BUN: 31 mg/dL — ABNORMAL HIGH (ref 6–23)
CO2: 23 mEq/L (ref 19–32)
Chloride: 103 mEq/L (ref 96–112)
GFR calc non Af Amer: 59 mL/min — ABNORMAL LOW (ref >90)
Glucose, Bld: 98 mg/dL (ref 70–99)
Potassium: 4.4 mEq/L (ref 3.5–5.1)
Sodium: 136 mEq/L (ref 135–145)

## 2011-09-15 MED ORDER — ACETAMINOPHEN 325 MG PO TABS: 650.0000 mg | ORAL_TABLET | Freq: Four times a day (QID) | ORAL | Status: DC | PRN

## 2011-09-15 MED ORDER — ONDANSETRON HCL 4 MG/2ML IJ SOLN: 4.0000 mg | Freq: Four times a day (QID) | INTRAMUSCULAR | Status: DC | PRN

## 2011-09-15 MED ORDER — CIPROFLOXACIN HCL 500 MG PO TABS
500.0000 mg | ORAL_TABLET | Freq: Two times a day (BID) | ORAL | Status: DC
  Administered 2011-09-15 – 2011-09-16 (×3): 500 mg via ORAL
  Filled 2011-09-15 (×5): qty 1

## 2011-09-15 MED ORDER — SODIUM CHLORIDE 0.9 % IV SOLN
INTRAVENOUS | Status: DC
  Administered 2011-09-15: 125 mL/h via INTRAVENOUS

## 2011-09-15 MED ORDER — ACETAMINOPHEN 650 MG RE SUPP: 650.0000 mg | Freq: Four times a day (QID) | RECTAL | Status: DC | PRN

## 2011-09-15 MED ORDER — ALUM & MAG HYDROXIDE-SIMETH 200-200-20 MG/5ML PO SUSP: 30.0000 mL | Freq: Four times a day (QID) | ORAL | Status: DC | PRN

## 2011-09-15 MED ORDER — CIPROFLOXACIN IN D5W 200 MG/100ML IV SOLN
200.0000 mg | INTRAVENOUS | Status: DC
  Filled 2011-09-15: qty 100

## 2011-09-15 MED ORDER — HYDROMORPHONE HCL PF 1 MG/ML IJ SOLN: 0.5000 mg | INTRAMUSCULAR | Status: DC | PRN

## 2011-09-15 MED ORDER — CIPROFLOXACIN IN D5W 200 MG/100ML IV SOLN
200.0000 mg | Freq: Two times a day (BID) | INTRAVENOUS | Status: DC
Start: 2011-09-15 — End: 2011-09-15
  Filled 2011-09-15: qty 100

## 2011-09-15 MED ORDER — POLYETHYLENE GLYCOL 3350 17 G PO PACK
17.0000 g | PACK | Freq: Two times a day (BID) | ORAL | Status: DC
  Administered 2011-09-15 – 2011-09-16 (×3): 17 g via ORAL
  Filled 2011-09-15 (×4): qty 1

## 2011-09-15 MED ORDER — OXYCODONE HCL 5 MG PO TABS: 5.0000 mg | ORAL_TABLET | ORAL | Status: DC | PRN

## 2011-09-15 MED ORDER — ZOLPIDEM TARTRATE 5 MG PO TABS: 5.0000 mg | ORAL_TABLET | Freq: Every evening | ORAL | Status: DC | PRN

## 2011-09-15 MED ORDER — ONDANSETRON HCL 4 MG PO TABS: 4.0000 mg | ORAL_TABLET | Freq: Four times a day (QID) | ORAL | Status: DC | PRN

## 2011-09-15 MED ORDER — SODIUM CHLORIDE 0.9 % IV SOLN: INTRAVENOUS | Status: DC

## 2011-09-15 MED ORDER — CIPROFLOXACIN IN D5W 400 MG/200ML IV SOLN
400.0000 mg | Freq: Once | INTRAVENOUS | Status: AC
  Administered 2011-09-15: 400 mg via INTRAVENOUS
  Filled 2011-09-15: qty 200

## 2011-09-15 MED ORDER — MAGNESIUM CITRATE PO SOLN
1.0000 | Freq: Once | ORAL | Status: AC
  Administered 2011-09-15: 1 via ORAL
  Filled 2011-09-15: qty 296

## 2011-09-15 NOTE — ED Notes (Signed)
Pt states he was tx for uti at [facility] today, but this evening he started seeing blood in his urine, experienced urinary urgency, but was unable to urinate in large quantities, even though he was drinking a lot.  Denies pain.  Suprapubic area does not seem greatly enlarged and is non-tender.

## 2011-09-15 NOTE — ED Notes (Signed)
Dr Mena Goes with [facility] at bedside irrigating the bladder.  Pullback of copious amounts of clots until clear.

## 2011-09-15 NOTE — ED Notes (Signed)
Urine running clear.  2nd irrigation bag hung at slow flow.

## 2011-09-15 NOTE — ED Notes (Signed)
Bladder irrigated x1 hour using 5 L of 0.9 NS.  Dr Dierdre Highman notified that urine continues to be red and clots continue to be expelled, even after 5 L.

## 2011-09-15 NOTE — Consult Note (Signed)
Consult - gross hematuria Referring provider - Dr. 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 Although he does CIC it is variable and only does it when he can "feel his bladder is very hard" in his lower abdomen. He also voids on his own. He's had no dysuria. No F/C or N/V.   Past Medical History  Diagnosis Date  . Hypertension    Past Surgical History  Procedure Date  . Fracture surgery   . Hip surgery     Home Medications:   (Not in a hospital admission) Allergies: No Known Allergies  History reviewed. No pertinent family history. Social History:  reports that he has never smoked. He does not have any smokeless tobacco history on file. He reports that he does not drink alcohol or use illicit drugs.  ROS: A complete review of systems was performed.  All systems are negative except for pertinent findings as noted. @ROS @  Physical Exam:  Vital signs in last 24 hours: Temp:  [97.5 F (36.4 C)-97.7 F (36.5 C)] 97.5 F (36.4 C) (02/16 2227) Pulse Rate:  [81-85] 81  (02/16 2227) Resp:  [18-20] 20  (02/16 2227) BP: (136-156)/(50-64) 136/64 mmHg (02/16 2227) SpO2:  [100 %] 100 % (02/16 2227) General:  Alert and oriented, No acute distress, elderly HEENT: Normocephalic, atraumatic Neck: No JVD or lymphadenopathy Cardiovascular: Regular rate and rhythm Lungs: no resp distress, normal rate Abdomen: Soft, nontender, nondistended, no abdominal masses Back: No CVA tenderness Genitourinary:  Normal male phallus, testes are descended bilaterally and non-tender and without masses, scrotum is normal in appearance without lesions or masses, perineum is normal on inspection. 18Fr foley in place, urine bloody. Rectal: Normal sphincter tone, no rectal masses, prostate is non tender and without nodularity. Prostate size is estimated to  be 50 g Extremities: No edema Neurologic: Grossly intact  Bladder irrigated - lots of clots, bleeding still persists, but clot free. 18Fr foley removed. 24Fr 3 way placed. Bladder irrigates easily, clot free, CBI started.   Laboratory Data:  Results for orders placed during the hospital encounter of 09/14/11 (from the past 24 hour(s))  URINALYSIS, ROUTINE W REFLEX MICROSCOPIC     Status: Abnormal   Collection Time   09/14/11 10:47 PM      Component Value Range   Color, Urine RED (*) YELLOW    APPearance TURBID (*) CLEAR    Specific Gravity, Urine 1.025  1.005 - 1.030    pH 8.0  5.0 - 8.0    Glucose, UA 100 (*) NEGATIVE (mg/dL)   Hgb urine dipstick LARGE (*) NEGATIVE    Bilirubin Urine LARGE (*) NEGATIVE    Ketones, ur 15 (*) NEGATIVE (mg/dL)   Protein, ur >409 (*) NEGATIVE (mg/dL)   Urobilinogen, UA 2.0 (*) 0.0 - 1.0 (mg/dL)   Nitrite POSITIVE (*) NEGATIVE    Leukocytes, UA MODERATE (*) NEGATIVE   URINE MICROSCOPIC-ADD ON     Status: Abnormal   Collection Time   09/14/11 10:47 PM      Component Value Range   Squamous Epithelial / LPF RARE  RARE    WBC, UA 21-50  <3 (WBC/hpf)   RBC / HPF 21-50  <3 (RBC/hpf)   Bacteria, UA MANY (*) RARE   POCT I-STAT, CHEM 8     Status: Abnormal   Collection Time   09/14/11 11:36 PM  Component Value Range   Sodium 136  135 - 145 (mEq/L)   Potassium 4.6  3.5 - 5.1 (mEq/L)   Chloride 106  96 - 112 (mEq/L)   BUN 39 (*) 6 - 23 (mg/dL)   Creatinine, Ser 1.61  0.50 - 1.35 (mg/dL)   Glucose, Bld 096 (*) 70 - 99 (mg/dL)   Calcium, Ion 0.45  4.09 - 1.32 (mmol/L)   TCO2 25  0 - 100 (mmol/L)   Hemoglobin 11.2 (*) 13.0 - 17.0 (g/dL)   HCT 81.1 (*) 91.4 - 52.0 (%)  CBC     Status: Abnormal   Collection Time   09/15/11 12:50 AM      Component Value Range   WBC 6.7  4.0 - 10.5 (K/uL)   RBC 3.59 (*) 4.22 - 5.81 (MIL/uL)   Hemoglobin 10.7 (*) 13.0 - 17.0 (g/dL)   HCT 78.2 (*) 95.6 - 52.0 (%)   MCV 90.0  78.0 - 100.0 (fL)   MCH 29.8  26.0 - 34.0  (pg)   MCHC 33.1  30.0 - 36.0 (g/dL)   RDW 21.3  08.6 - 57.8 (%)   Platelets 269  150 - 400 (K/uL)   No results found for this or any previous visit (from the past 240 hour(s)). Creatinine:  Basename 09/14/11 2336  CREATININE 1.20    Impression/Assessment:  Gross hematuria - may be bleeding from BPH or hemorrhagic cystitis from UTI and retention/bladder distention. Also could have bladder stone from chronic retention.  Plan:  -continue CBI usually bleeding self limited, will need outpatient cystoscopy -check CT A/P to rule out any large mass, bone disease, bladder stone, etc. -check PSA to rule out advanced prostate cancer (not for PSA/prostate cancer screening) -labs/serial H/H, transfusion, meds, abx, fluids per hospitalist -I will follow  Antony Haste 09/15/2011, 2:21 AM

## 2011-09-15 NOTE — H&P (Signed)
DATE OF ADMISSION:  09/15/2011  PCP:    [name], MD, MD   Chief Complaint:    HPI: [name] Bennett is an 78 y.o. male who presents to ED complaints of bloody urine over past 24 hours.  He went to [facility] and was placed on Cipro.  He did take 1 dose of Cipro, but he had difficulty urinating and presented to the ED.  He denied ABD Pain, Nausea or vomiting . He does report having increased constipation this week.  Patient denies having fevers or chills or nausea or vomiting.  He was seen in the ED by Urologist Dr Mena Goes in the ED and Continuous Bladder Irrigations were ordered.      Past Medical History  Diagnosis Date  . Hypertension     Past Surgical History  Procedure Date  . Fracture surgery   . Hip surgery     Medications:  HOME MEDS: Prior to Admission medications   Medication Sig Start Date End Date Taking? Authorizing Provider  aspirin EC 81 MG tablet Take 81 mg by mouth daily.   Yes Historical Provider, MD  ciprofloxacin (CIPRO) 500 MG tablet Take 500 mg by mouth every 12 (twelve) hours. Take for 7 days 09/14/11 09/24/11 Yes Richardo Priest, MD  furosemide (LASIX) 40 MG tablet Take 40 mg by mouth daily.  04/05/11  Yes Duke Salvia, MD    Allergies:  No Known Allergies  Social History: Widower  reports that he has never smoked. He does not have any smokeless tobacco history on file. He reports that he does not drink alcohol or use illicit drugs.  Family History: History reviewed. No pertinent family history.  Review of Systems:  The patient denies anorexia, fever, vision loss, decreased hearing, hoarseness, chest pain, syncope, dyspnea on exertion, peripheral edema, balance deficits, hemoptysis, abdominal pain, melena, hematochezia, severe indigestion/heartburn, incontinence, genital sores, muscle weakness, suspicious skin lesions, transient blindness, difficulty walking, depression, unusual weight change, abnormal bleeding, enlarged lymph nodes, angioedema,  and breast masses.   Physical Exam:  GEN:  Pleasant  78 year old Elderly African American male examined  and in no acute distress; cooperative with exam Filed Vitals:   09/14/11 2227 09/15/11 0221 09/15/11 0518 09/15/11 0542  BP: 136/64 153/78 122/70 143/77  Pulse: 81 73 71 83  Temp: 97.5 F (36.4 C) 97.5 F (36.4 C) 97.2 F (36.2 C) 96.8 F (36 C)  TempSrc: Oral Oral Oral Oral  Resp: 20 20 18 20   Height:    6\' 2"  (1.88 m)  Weight:    64.1 kg (141 lb 5 oz)  SpO2: 100% 99% 100%    Blood pressure 143/77, pulse 83, temperature 96.8 F (36 C), temperature source Oral, resp. rate 20, height 6\' 2"  (1.88 m), weight 64.1 kg (141 lb 5 oz), SpO2 100.00%. PSYCH: SHe is alert and oriented x4; does not appear anxious does not appear depressed; affect is normal HEENT: Normocephalic and Atraumatic, Mucous membranes pink; PERRLA; EOM intact; Fundi:  Benign;  No scleral icterus, Nares: Patent, Oropharynx: Clear, Fair Dentition, Neck:  FROM, no cervical lymphadenopathy nor thyromegaly or carotid bruit; no JVD; Breasts:: Not examined CHEST WALL: No tenderness CHEST: Normal respiration, clear to auscultation bilaterally HEART: Regular rate and rhythm; no murmurs rubs or gallops BACK: No kyphosis or scoliosis; no CVA tenderness ABDOMEN: Positive Bowel Sounds, Scaphoid, soft non-tender; no masses, no organomegaly, no pannus; no intertriginous candida. Rectal Exam: Not done EXTREMITIES: No bone or joint deformity; age-appropriate arthropathy of the hands  and knees; no cyanosis, clubbing or edema; no ulcerations. Genitalia: Foley with leg bag present irrigating currently PULSES: 2+ and symmetric SKIN: Normal hydration no rash or ulceration CNS: Cranial nerves 2-12 grossly intact no focal neurologic deficit   Labs & Imaging Results for orders placed during the hospital encounter of 09/14/11 (from the past 48 hour(s))  URINALYSIS, ROUTINE W REFLEX MICROSCOPIC     Status: Abnormal   Collection Time    09/14/11 10:47 PM      Component Value Range Comment   Color, Urine RED (*) YELLOW  BIOCHEMICALS MAY BE AFFECTED BY COLOR   APPearance TURBID (*) CLEAR     Specific Gravity, Urine 1.025  1.005 - 1.030     pH 8.0  5.0 - 8.0     Glucose, UA 100 (*) NEGATIVE (mg/dL)    Hgb urine dipstick LARGE (*) NEGATIVE     Bilirubin Urine LARGE (*) NEGATIVE     Ketones, ur 15 (*) NEGATIVE (mg/dL)    Protein, ur >098 (*) NEGATIVE (mg/dL)    Urobilinogen, UA 2.0 (*) 0.0 - 1.0 (mg/dL)    Nitrite POSITIVE (*) NEGATIVE     Leukocytes, UA MODERATE (*) NEGATIVE    URINE MICROSCOPIC-ADD ON     Status: Abnormal   Collection Time   09/14/11 10:47 PM      Component Value Range Comment   Squamous Epithelial / LPF RARE  RARE     WBC, UA 21-50  <3 (WBC/hpf)    RBC / HPF 21-50  <3 (RBC/hpf)    Bacteria, UA MANY (*) RARE    POCT I-STAT, CHEM 8     Status: Abnormal   Collection Time   09/14/11 11:36 PM      Component Value Range Comment   Sodium 136  135 - 145 (mEq/L)    Potassium 4.6  3.5 - 5.1 (mEq/L)    Chloride 106  96 - 112 (mEq/L)    BUN 39 (*) 6 - 23 (mg/dL)    Creatinine, Ser 1.19  0.50 - 1.35 (mg/dL)    Glucose, Bld 147 (*) 70 - 99 (mg/dL)    Calcium, Ion 8.29  1.12 - 1.32 (mmol/L)    TCO2 25  0 - 100 (mmol/L)    Hemoglobin 11.2 (*) 13.0 - 17.0 (g/dL)    HCT 56.2 (*) 13.0 - 52.0 (%)   CBC     Status: Abnormal   Collection Time   09/15/11 12:50 AM      Component Value Range Comment   WBC 6.7  4.0 - 10.5 (K/uL)    RBC 3.59 (*) 4.22 - 5.81 (MIL/uL)    Hemoglobin 10.7 (*) 13.0 - 17.0 (g/dL)    HCT 86.5 (*) 78.4 - 52.0 (%)    MCV 90.0  78.0 - 100.0 (fL)    MCH 29.8  26.0 - 34.0 (pg)    MCHC 33.1  30.0 - 36.0 (g/dL)    RDW 69.6  29.5 - 28.4 (%)    Platelets 269  150 - 400 (K/uL)   ABO/RH     Status: Normal (Preliminary result)   Collection Time   09/15/11  1:13 AM      Component Value Range Comment   ABO/RH(D) O POS     TYPE AND SCREEN     Status: Normal   Collection Time   09/15/11  1:19 AM       Component Value Range Comment   ABO/RH(D) O POS      Antibody  Screen NEG      Sample Expiration 09/18/2011     CBC     Status: Abnormal   Collection Time   09/15/11  4:36 AM      Component Value Range Comment   WBC 7.0  4.0 - 10.5 (K/uL)    RBC 3.32 (*) 4.22 - 5.81 (MIL/uL)    Hemoglobin 9.8 (*) 13.0 - 17.0 (g/dL)    HCT 16.1 (*) 09.6 - 52.0 (%)    MCV 88.9  78.0 - 100.0 (fL)    MCH 29.5  26.0 - 34.0 (pg)    MCHC 33.2  30.0 - 36.0 (g/dL)    RDW 04.5  40.9 - 81.1 (%)    Platelets 212  150 - 400 (K/uL)    Ct Abdomen Pelvis Wo Contrast  09/15/2011  *RADIOLOGY REPORT*  Clinical Data: Gross hematuria; unable to void.  CT ABDOMEN AND PELVIS WITHOUT CONTRAST  Technique:  Multidetector CT imaging of the abdomen and pelvis was performed following the standard protocol without intravenous contrast.  Comparison: Pelvic radiograph performed 12/05/2006  Findings: Mild scarring and blebs are noted at the lung bases. Diffuse coronary artery calcifications are seen.  A 4 mm hypodensity is noted in the right hepatic lobe.  The liver is unremarkable in appearance.  The spleen is within normal limits. The gallbladder is unremarkable.  The adrenal glands and pancreas are unremarkable in appearance.  A 1.2 cm hypodensity is noted within the interpole region of the right kidney; a 1.7 cm cyst is noted at the upper pole of the right kidney.  The kidneys are otherwise unremarkable in appearance. There is no evidence of hydronephrosis.  No renal or ureteral stones are seen.  No perinephric stranding is appreciated.  No free fluid is identified.  The small bowel is unremarkable in appearance.  The stomach is within normal limits.  A focal aneurysm is noted along the distal abdominal aorta, arising distal to the renal arteries, and resolving at the aortic bifurcation.  It measures 4.4 cm in AP dimension.  Scattered calcification is noted along the superior mesenteric artery and at the origins of both renal arteries.  A 3.5  cm aneurysm is noted along the distal right common iliac artery.  The appendix is normal in caliber and contains minimal air, without evidence for appendicitis.  The colon is grossly unremarkable in appearance.  The bladder is relatively decompressed; mildly irregular bladder wall thickening may reflect chronic inflammation, though a mass cannot be excluded given the patient's hematuria.  The patient's Foley catheter is not well characterized due to metal artifact; suggest clinical correlation to ensure that the balloon is in the appropriate position.  The prostate is difficult to fully assess due to artifact; scattered calcification is noted within the prostate.  No inguinal lymphadenopathy is seen.  No acute osseous abnormalities are identified.  A right hip hemiarthroplasty is grossly unremarkable in appearance.  Multilevel vacuum phenomenon is noted along the lumbar spine.  IMPRESSION:  1.  Bladder relatively decompressed; mildly irregular diffuse bladder wall thickening may reflect chronic inflammation, though a mass cannot be excluded given gross hematuria. 2.  Foley catheter not well characterized due to metal artifact; suggest clinical correlation to ensure that the balloon is in the appropriate position. 3.  4.4 cm focal aneurysm along the distal abdominal aorta, arising distal to the renal arteries and resolving at the aortic bifurcation. 4.  3.5 cm aneurysm noted along the distal right common iliac artery. 5.  Diffuse coronary artery  calcifications seen. 6.  Scattered calcification along the superior mesenteric artery and at the origins of both renal arteries. 7.  Mild scarring and blebs noted at the lung bases. 8.  Scattered right renal cysts and likely tiny hepatic cyst. 9.  Mild degenerative change noted along the lumbar spine.  Original Report Authenticated By: Tonia Ghent, M.D.      Assessment/Plan: Present on Admission:  .Hematuria .UTI (lower urinary tract  infection) .Anemia .HYPERTENSION, MILD .DEGENERATIVE JOINT DISEASE, KNEES, BILATERAL .Constipation .Urinary retention   Plan:  Seen by Urology Dr. Mena Goes in ED Continuous bladder irrigations ordered Urine C+S sent IV Cipro IVFs Miralax BID SCDs for DVTs Other plans as per orders.    CODE STATUS:      FULL CODE        Zebastian Carico C 09/15/2011, 5:52 AM

## 2011-09-15 NOTE — ED Notes (Signed)
Assisted Dr Mena Goes in placing 24 fr triple lumen.

## 2011-09-15 NOTE — ED Notes (Signed)
Urine continues to be pink tinged, no clots noted.  CBI titrated to slow d/t continued clear urine.

## 2011-09-15 NOTE — ED Notes (Signed)
Patient transported to CT 

## 2011-09-15 NOTE — Progress Notes (Signed)
Subjective: Patient without complaint. No pain.   Objective: Vital signs in last 24 hours: Temp:  [96.8 F (36 C)-97.7 F (36.5 C)] 96.8 F (36 C) (02/17 0542) Pulse Rate:  [71-85] 83  (02/17 0542) Resp:  [18-20] 20  (02/17 0542) BP: (122-156)/(50-78) 143/77 mmHg (02/17 0542) SpO2:  [99 %-100 %] 100 % (02/17 0518) Weight:  [64.1 kg (141 lb 5 oz)] 64.1 kg (141 lb 5 oz) (02/17 0542)  Intake/Output from previous day: 02/16 0701 - 02/17 0700 In: 8025 [I.V.:525] Out: 56213 [Urine:12800] Intake/Output this shift: Total I/O In: 360 [P.O.:360] Out: 5650 [Urine:5650]  Physical Exam:  Abd - soft, NT, bladder not distended GU - urine light pink to clear on slow CBI.  Lab Results:  Basename 09/15/11 0436 09/15/11 0050 09/14/11 2336  HGB 9.8* 10.7* 11.2*  HCT 29.5* 32.3* 33.0*   BMET  Basename 09/15/11 0436 09/14/11 2336  NA 136 136  K 4.4 4.6  CL 103 106  CO2 23 --  GLUCOSE 98 104*  BUN 31* 39*  CREATININE 1.06 1.20  CALCIUM 9.4 --   No results found for this basename: LABPT:3,INR:3 in the last 72 hours No results found for this basename: LABURIN:1 in the last 72 hours No results found for this or any previous visit.  Studies/Results: Ct Abdomen Pelvis Wo Contrast  09/15/2011  *RADIOLOGY REPORT*  Clinical Data: Gross hematuria; unable to void.  CT ABDOMEN AND PELVIS WITHOUT CONTRAST  Technique:  Multidetector CT imaging of the abdomen and pelvis was performed following the standard protocol without intravenous contrast.  Comparison: Pelvic radiograph performed 12/05/2006  Findings: Mild scarring and blebs are noted at the lung bases. Diffuse coronary artery calcifications are seen.  A 4 mm hypodensity is noted in the right hepatic lobe.  The liver is unremarkable in appearance.  The spleen is within normal limits. The gallbladder is unremarkable.  The adrenal glands and pancreas are unremarkable in appearance.  A 1.2 cm hypodensity is noted within the interpole region of  the right kidney; a 1.7 cm cyst is noted at the upper pole of the right kidney.  The kidneys are otherwise unremarkable in appearance. There is no evidence of hydronephrosis.  No renal or ureteral stones are seen.  No perinephric stranding is appreciated.  No free fluid is identified.  The small bowel is unremarkable in appearance.  The stomach is within normal limits.  A focal aneurysm is noted along the distal abdominal aorta, arising distal to the renal arteries, and resolving at the aortic bifurcation.  It measures 4.4 cm in AP dimension.  Scattered calcification is noted along the superior mesenteric artery and at the origins of both renal arteries.  A 3.5 cm aneurysm is noted along the distal right common iliac artery.  The appendix is normal in caliber and contains minimal air, without evidence for appendicitis.  The colon is grossly unremarkable in appearance.  The bladder is relatively decompressed; mildly irregular bladder wall thickening may reflect chronic inflammation, though a mass cannot be excluded given the patient's hematuria.  The patient's Foley catheter is not well characterized due to metal artifact; suggest clinical correlation to ensure that the balloon is in the appropriate position.  The prostate is difficult to fully assess due to artifact; scattered calcification is noted within the prostate.  No inguinal lymphadenopathy is seen.  No acute osseous abnormalities are identified.  A right hip hemiarthroplasty is grossly unremarkable in appearance.  Multilevel vacuum phenomenon is noted along the lumbar spine.  IMPRESSION:  1.  Bladder relatively decompressed; mildly irregular diffuse bladder wall thickening may reflect chronic inflammation, though a mass cannot be excluded given gross hematuria. 2.  Foley catheter not well characterized due to metal artifact; suggest clinical correlation to ensure that the balloon is in the appropriate position. 3.  4.4 cm focal aneurysm along the distal  abdominal aorta, arising distal to the renal arteries and resolving at the aortic bifurcation. 4.  3.5 cm aneurysm noted along the distal right common iliac artery. 5.  Diffuse coronary artery calcifications seen. 6.  Scattered calcification along the superior mesenteric artery and at the origins of both renal arteries. 7.  Mild scarring and blebs noted at the lung bases. 8.  Scattered right renal cysts and likely tiny hepatic cyst. 9.  Mild degenerative change noted along the lumbar spine.  Original Report Authenticated By: Tonia Ghent, M.D.   CT images reviewed - prostate not very large, no masses   Assessment/Plan:  Likely hemorrhagic cystitis from UTI and urinary retention  -Will stop CBI -Home tomorrow with foley if urine stays clear on po abx -I'll see him later in the week to remove foley in office and have him restart CIC    LOS: 1 day   [name] Bennett 09/15/2011, 10:16 AM

## 2011-09-15 NOTE — Progress Notes (Signed)
09/15/11 1000  Clinical Encounter Type  Visited With Patient  Visit Type Spiritual support  Referral From Nurse  Spiritual Encounters  Spiritual Needs Prayer    Chaplain's Note: Visited rm per referral from nurse.  Introduced self to pt and offered pastoral presence.  [name] Bennett and shared conversation about experiences of Christian faith.  Offered prayer with patient.  Pt appreciated chaplain visit and conversation.  Will follow-up as needed or requested.

## 2011-09-15 NOTE — Progress Notes (Signed)
Subjective: [name] Bennett says he feels well. He has no abdominal pain or any other complaint  Objective: Lab: Hgb 9.8 down from 11.2 at admission  Imaging: CT scan abdomen/pelvis reviewed: no masses or suspicion for mass/turmor. There is chronic bladder wall thickening.    Physical Exam: Filed Vitals:   09/15/11 0542  BP: 143/77  Pulse: 83  Temp: 96.8 F (36 C)  Resp: 20  Awake and alert and in good spirits (as always) HEENT- normal Pulm - normal respirations Cor - RRR Abd - BS+, no guarding or rebound, no tenderness      Assessment/Plan: 1. GU - hematuria resolving - urine in irrigation bag is clear. No fever or discomfort. U/A was positive. Plan - irrigation per Dr. Mena Goes - he will determine when to d/c           Continue antibiotics for probable cystitis           Repeat H/H in AM  He is otherwise doing well.   [name] 09/15/2011, 9:32 AM

## 2011-09-16 LAB — HEMOGLOBIN AND HEMATOCRIT, BLOOD: HCT: 29.7 % — ABNORMAL LOW (ref 39.0–52.0)

## 2011-09-16 NOTE — Progress Notes (Signed)
Utilization review complete 

## 2011-09-16 NOTE — Progress Notes (Signed)
Patient ID: [name] Bennett, male   DOB: 22-Jan-1918, 78 y.o.   MRN: [id number]  Pt without complaint. He says he was not performing CIC daily as he was instructed. He was "voiding on his own". He doesn't have a catheter at home.  PE: Sitting in chair eating. GU - urine clear  He was positioned supine on the bed. The 3 way foley removed. He was prepped and a 16Fr foley placed and left to gravity.Foley irrigated normal. Pt has a large bladder.   Imp - Chronic urinary retention Hypocontractile bladder UTI, hemorrhagic cystitis - resolved  Plan  -d/c home with foley and leg bag, overnight bag -F/u with me in 7-10 days for foley removal -pt given new Rx for 14Fr catheters. He said he will have his niece take him by [facility]. -ok to d/c home for GU point of view

## 2011-09-16 NOTE — Discharge Summary (Signed)
[name] Bennett, [name] Bennett              ACCOUNT NO.:  [id number]  MEDICAL RECORD NO.:  [id number]  LOCATION:  4507                         FACILITY:  [facility]  PHYSICIAN:  Rosalyn Gess. Evert Wenrich, MD  DATE OF BIRTH:  [birth year]  DATE OF ADMISSION:  09/14/2011 DATE OF DISCHARGE:  09/16/2011                              DISCHARGE SUMMARY   ADMITTING DIAGNOSES: 1. Hematuria. 2. Hemorrhagic cystitis.  DISCHARGE DIAGNOSES: 1. Hematuria. 2. Hemorrhagic cystitis.  CONSULTANTS:  Dr. Mena Goes from Urology.  PROCEDURES:  CT scan of the abdomen and pelvis performed September 15, 2011, which showed that the bladder was relatively decompressed, mildly irregular diffuse bladder wall thickening, possibly reflecting chronic inflammation.  A 4 x 4 cm focal aneurysm along with distal abdominal aorta arising distal to the renal arteries and resolving at the aortic bifurcation.  A 3.5-cm aneurysm noted along the distal right common iliac artery.  Diffuse coronary artery calcifications are noted. Scattered calcifications along the superior mesenteric artery.  HISTORY OF PRESENT ILLNESS:  [name] Bennett is a delightful 78 year old gentleman who lives independently.  He remains very active in his community and his church and is an excellent cook.  He presented to the emergency department with complaints of bloody urine for the past 24 hours.  He had gone to urgent care and had been started on ciprofloxacin.  He was able to tolerate 1 dose, but he had difficulty with urination and presented to the emergency department.  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  The patient denied any abdominal pain, nausea, or vomiting.  He reported having some increased constipation during the week.  He denied any fevers, chills, nausea.  He was seen in the emergency department by Dr. Mena Goes for Urology and continuous bladder irrigations were ordered and started.  Please see the H and P and previous  epic dictations for past medical history, family history, social history, and admission examination.  HOSPITAL COURSE:  The patient was admitted to a regular bed.  He had continuous bladder irrigations for the first 24 hours.  The patient did have followup lab work monitoring his hemoglobin which dropped from 12.8 g on September 14, 2011, as baseline to 11.2 g on September 14, 2011, to a 10.7 to 9.8 on September 15, 2011, and this remained stable on the morning of September 16, 2011.  The patient's urine cleared.  He was seen by Dr. Mena Goes in followup who felt the patient's bleeding origin was hemorrhagic cystitis.  He recommended continuing his ciprofloxacin.  The patient was taken off continuous bladder irrigations.  Dr. Mena Goes recommended discharging the patient home with a Foley catheter in place, with followup in his office in several days to remove the Foley.  With the patient having no other medical problems and remaining hemodynamically stable, with his hemoglobin having stabilized at 9.8 g, the patient is felt to be ready for discharge to home.  DISCHARGE EXAMINATION:  VITAL SIGNS:  Temperature was 97.5, blood pressure 126/58, heart rate was 70, respirations 20, oxygen saturation was 99% on room air. GENERAL APPEARANCE:  This is a tall, thin gentleman looking younger than his stated age of 78, in no  acute distress. HEENT:  Unremarkable. CHEST:  The patient is moving air well with no rales, wheezes, or rhonchi. CARDIOVASCULAR:  2+ radial pulses.  Precordium was quiet.  His heart rate was regular. ABDOMEN:  Soft with positive bowel sounds.  No guarding or rebound was noted.  NEUROLOGIC:  The patient is awake, alert, oriented to person, place, time, context, and situation.  He moves all extremities to command.  His exam is nonfocal.  FINAL LABORATORY:  Hemoglobin on the day of discharge was 9.8 g, white count on September 15, 2011, was 7000 with unremarkable  differential. Chemistries from September 15, 2011, with a sodium of 136, potassium 4.4, chloride 103, CO2 of 23, BUN of 31, creatinine is 1.06.  With the patient's hematuria having stopped with diagnosis being established with the patient being hemodynamically stable, he is now ready for discharge to home.  He will follow up with Dr. Mena Goes as instructed.  There is no indication for follow up with Internal Medicine unless he has a new problem, and the patient does note he could call at any time.  CONDITION AT THE TIME OF DISCHARGE DICTATION:  Medically stable and improved.     Rosalyn Gess [name] Feigel, MD     MEN/MEDQ  D:  09/16/2011  T:  09/16/2011  Job:  [id number]

## 2011-09-17 LAB — URINE CULTURE: Colony Count: >=100000

## 2011-11-01 ENCOUNTER — Other Ambulatory Visit: Payer: Self-pay | Admitting: *Deleted

## 2011-11-01 MED ORDER — FUROSEMIDE 40 MG PO TABS: 40.0000 mg | ORAL_TABLET | Freq: Every day | ORAL | Status: DC

## 2011-11-01 MED ORDER — LOSARTAN POTASSIUM 50 MG PO TABS: 50.0000 mg | ORAL_TABLET | Freq: Every day | ORAL | Status: DC

## 2011-11-01 NOTE — Telephone Encounter (Signed)
R'cd fax from [facility] for refill of Losartan and Furosemide

## 2012-02-21 IMAGING — CT CT ABD-PELV W/O CM
2 of 4 series · 12 of 32 positions shown, 17 images · non-contrast
Comparison: Pelvic radiograph performed 12/05/2006

CLINICAL DATA: Gross hematuria; unable to void.

CT ABDOMEN AND PELVIS WITHOUT CONTRAST
TECHNIQUE: Multidetector CT imaging of the abdomen and pelvis was
performed following the standard protocol without intravenous
contrast.

[Series 2: renal stone · axial · 0.72mm/px · z∈[-482,-152]mm · 7 of 89 slices shown, 12 images]
[im 12/89  soft-tissue]
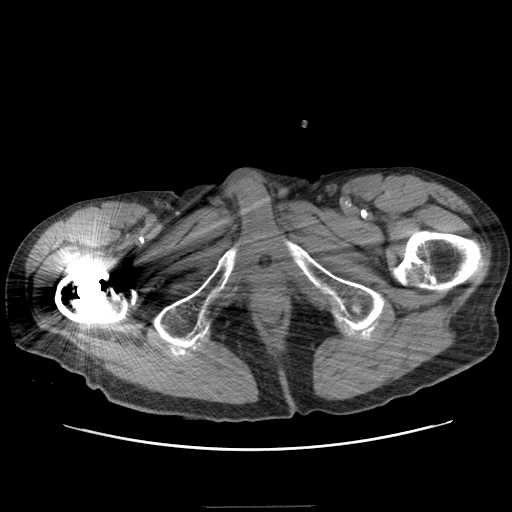
[im 12/89  bone]
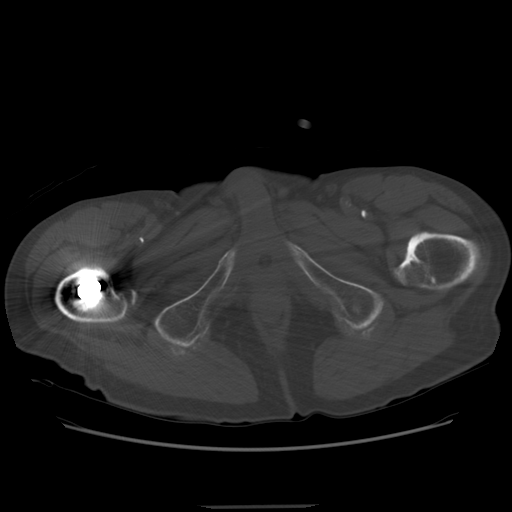
[im 23/89  soft-tissue]
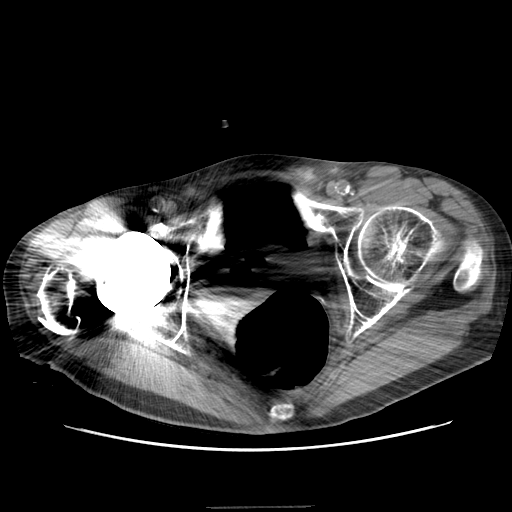
[im 34/89  soft-tissue]
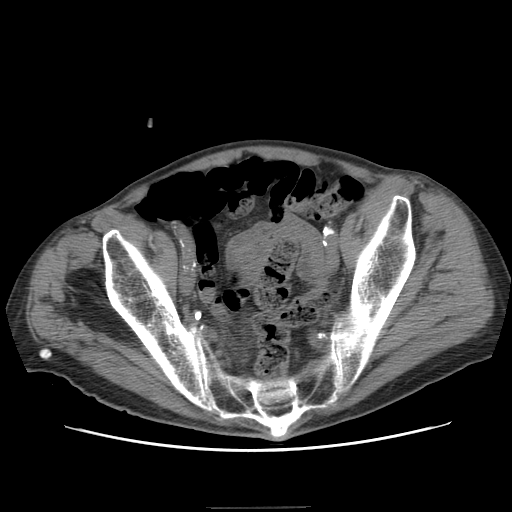
[im 45/89  soft-tissue]
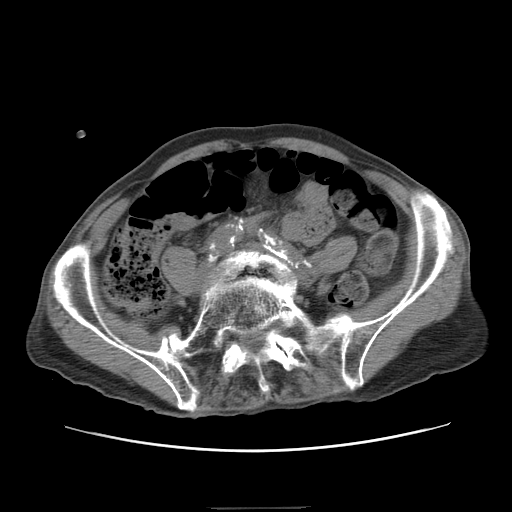
[im 45/89  lung]
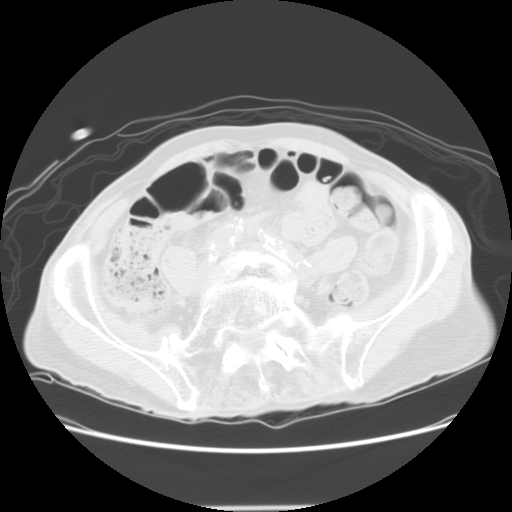
[im 56/89  soft-tissue]
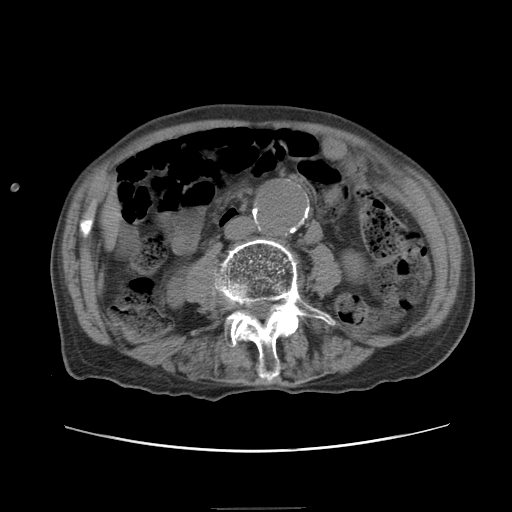
[im 56/89  lung]
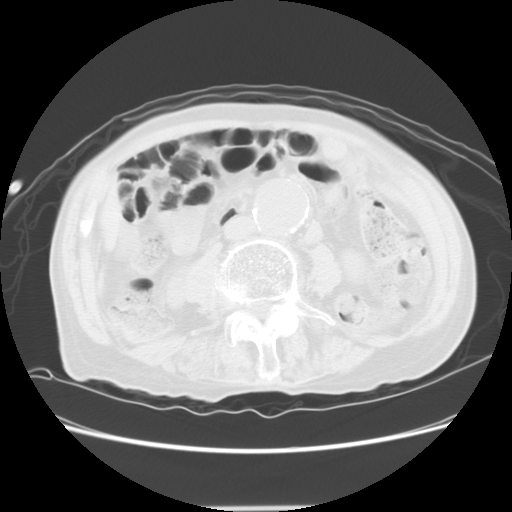
[im 67/89  soft-tissue]
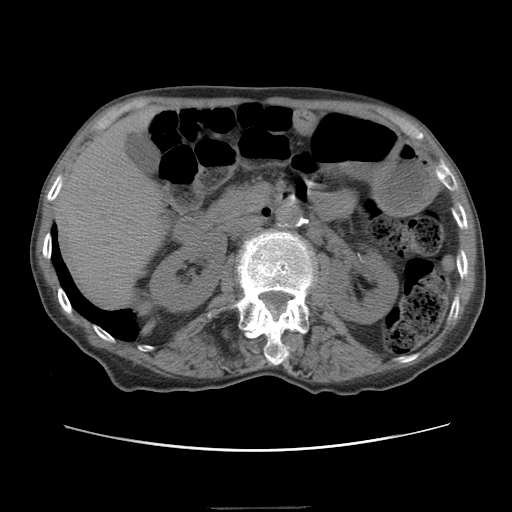
[im 67/89  lung]
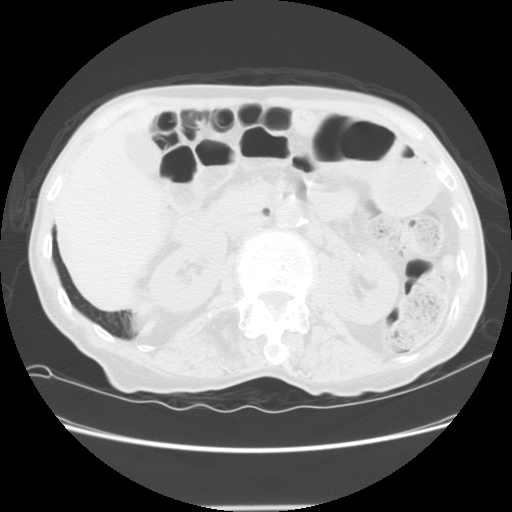
[im 78/89  soft-tissue]
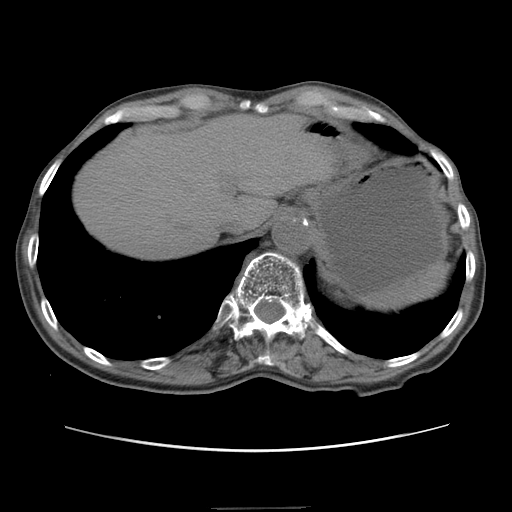
[im 78/89  lung]
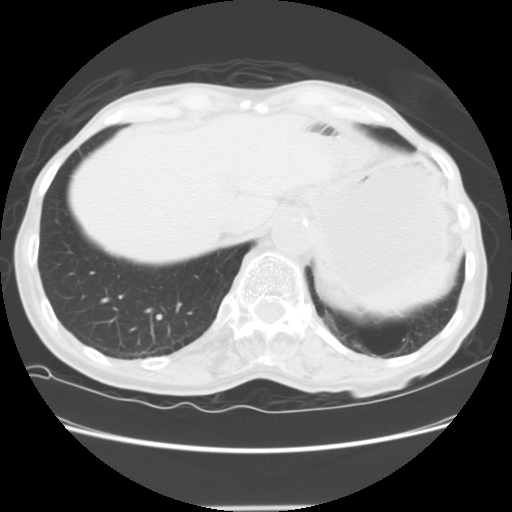

[Series 401: sag · sagittal · 0.88mm/px · 5 of 114 slices shown]
[im 12/114  soft-tissue]
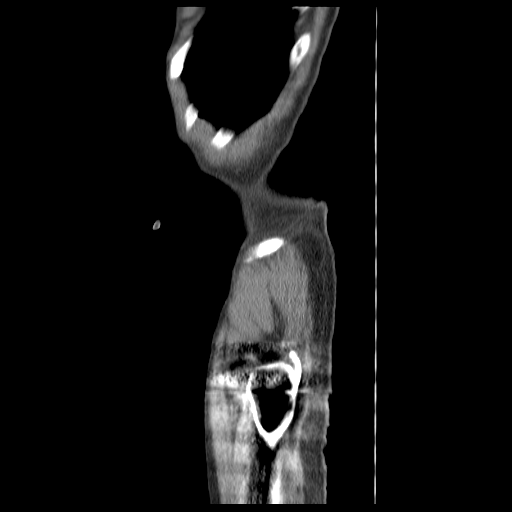
[im 23/114  soft-tissue]
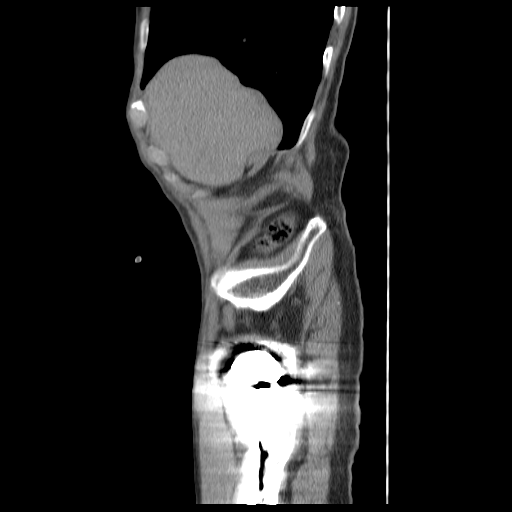
[im 34/114  soft-tissue]
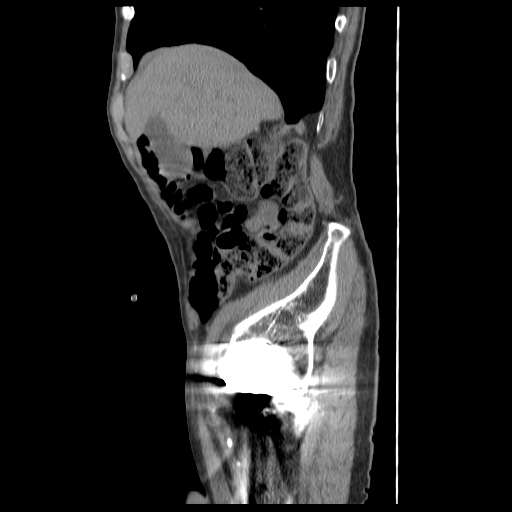
[im 46/114  soft-tissue]
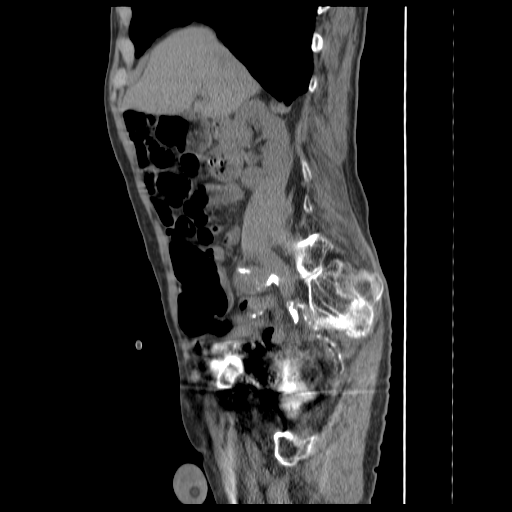
[im 68/114  soft-tissue]
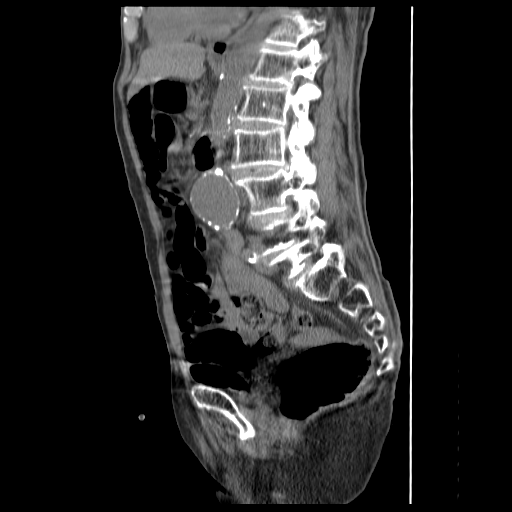

[12 of 32 positions shown; findings below may reference images not displayed]

FINDINGS: Mild scarring and blebs are noted at the lung bases.
Diffuse coronary artery calcifications are seen.

A 4 mm hypodensity is noted in the right hepatic lobe.  The liver
is unremarkable in appearance.  The spleen is within normal limits.
The gallbladder is unremarkable.  The adrenal glands and pancreas
are unremarkable in appearance.

A 1.2 cm hypodensity is noted within the interpole region of the
right kidney; a 1.7 cm cyst is noted at the upper pole of the right
kidney.  The kidneys are otherwise unremarkable in appearance.
There is no evidence of hydronephrosis.  No renal or ureteral
stones are seen.  No perinephric stranding is appreciated.

No free fluid is identified.  The small bowel is unremarkable in
appearance.  The stomach is within normal limits.

A focal aneurysm is noted along the distal abdominal aorta, arising
distal to the renal arteries, and resolving at the aortic
bifurcation.  It measures 4.4 cm in AP dimension.  Scattered
calcification is noted along the superior mesenteric artery and at
the origins of both renal arteries.

A 3.5 cm aneurysm is noted along the distal right common iliac
artery.

The appendix is normal in caliber and contains minimal air, without
evidence for appendicitis.  The colon is grossly unremarkable in
appearance.

The bladder is relatively decompressed; mildly irregular bladder
wall thickening may reflect chronic inflammation, though a mass
cannot be excluded given the patient's hematuria.

The patient's Foley catheter is not well characterized due to metal
artifact; suggest clinical correlation to ensure that the balloon
is in the appropriate position.  The prostate is difficult to fully
assess due to artifact; scattered calcification is noted within the
prostate.  No inguinal lymphadenopathy is seen.

No acute osseous abnormalities are identified.  A right hip
hemiarthroplasty is grossly unremarkable in appearance.  Multilevel
vacuum phenomenon is noted along the lumbar spine.
IMPRESSION: 1.  Bladder relatively decompressed; mildly irregular diffuse
bladder wall thickening may reflect chronic inflammation, though a
mass cannot be excluded given gross hematuria.
2.  Foley catheter not well characterized due to metal artifact;
suggest clinical correlation to ensure that the balloon is in the
appropriate position.
3.  4.4 cm focal aneurysm along the distal abdominal aorta, arising
distal to the renal arteries and resolving at the aortic
bifurcation.
4.  3.5 cm aneurysm noted along the distal right common iliac
artery.
5.  Diffuse coronary artery calcifications seen.
6.  Scattered calcification along the superior mesenteric artery
and at the origins of both renal arteries.
7.  Mild scarring and blebs noted at the lung bases.
8.  Scattered right renal cysts and likely tiny hepatic cyst.
9.  Mild degenerative change noted along the lumbar spine.

## 2012-06-05 ENCOUNTER — Other Ambulatory Visit: Payer: Self-pay | Admitting: *Deleted

## 2012-06-05 MED ORDER — FUROSEMIDE 40 MG PO TABS: 40.0000 mg | ORAL_TABLET | Freq: Every day | ORAL | Status: DC

## 2012-06-05 MED ORDER — LOSARTAN POTASSIUM 50 MG PO TABS: 50.0000 mg | ORAL_TABLET | Freq: Every day | ORAL | Status: DC

## 2012-06-05 NOTE — Telephone Encounter (Signed)
R'cd fax from [facility] for refill of Furosemide.

## 2012-06-08 ENCOUNTER — Other Ambulatory Visit: Payer: Self-pay | Admitting: *Deleted

## 2012-06-08 MED ORDER — FUROSEMIDE 40 MG PO TABS: 40.0000 mg | ORAL_TABLET | Freq: Every day | ORAL | Status: DC

## 2012-06-08 NOTE — Telephone Encounter (Signed)
Medication sent to [facility] on [address] for refill on furosemide. Explained to patient need to make appt. With Dr, Debby Bud for further refills. Transferred to a scheduler.

## 2012-07-29 HISTORY — PX: HIP FRACTURE SURGERY: SHX118

## 2012-07-30 ENCOUNTER — Other Ambulatory Visit: Payer: Self-pay | Admitting: *Deleted

## 2012-07-30 MED ORDER — LOSARTAN POTASSIUM 50 MG PO TABS: 50.0000 mg | ORAL_TABLET | Freq: Every day | ORAL | Status: DC

## 2012-07-31 ENCOUNTER — Other Ambulatory Visit: Payer: Self-pay | Admitting: *Deleted

## 2012-07-31 MED ORDER — LOSARTAN POTASSIUM 50 MG PO TABS: 50.0000 mg | ORAL_TABLET | Freq: Every day | ORAL | Status: DC

## 2012-08-12 ENCOUNTER — Encounter: Payer: Medicare Other | Admitting: Internal Medicine

## 2012-10-01 ENCOUNTER — Encounter: Payer: Medicare Other | Admitting: Internal Medicine

## 2012-10-07 ENCOUNTER — Telehealth: Payer: Self-pay

## 2012-10-07 NOTE — Telephone Encounter (Signed)
Received a fax from [facility] on [address] for pt's Furosemide 40 mg. His last refill was 06/08/12 #30 No refills and noted he needs to be seen in the office before anymore refills given. He cancelled appts on 08/12/12 and 10/01/12. Right now he has an upcoming appt on 11/03/12. Sent fax back to pharmacy stating pt is to be seen first.

## 2012-10-09 ENCOUNTER — Other Ambulatory Visit: Payer: Self-pay

## 2012-10-09 NOTE — Telephone Encounter (Signed)
Opened in error

## 2012-10-13 ENCOUNTER — Telehealth: Payer: Self-pay | Admitting: Internal Medicine

## 2012-10-13 MED ORDER — FUROSEMIDE 40 MG PO TABS: 40.0000 mg | ORAL_TABLET | Freq: Every day | ORAL | Status: DC

## 2012-10-13 NOTE — Telephone Encounter (Signed)
LMOM for pt that RX was sent in and he can call his pharmacy.

## 2012-10-13 NOTE — Telephone Encounter (Signed)
Pt is requesting a refill on Furosemide 40 to go to [facility].  He has an appt on April 8.

## 2012-10-13 NOTE — Telephone Encounter (Signed)
K. Rx refill sent to pharmacy

## 2012-11-03 ENCOUNTER — Encounter: Payer: Medicare Other | Admitting: Internal Medicine

## 2013-01-14 ENCOUNTER — Emergency Department (INDEPENDENT_AMBULATORY_CARE_PROVIDER_SITE_OTHER)
Admission: EM | Admit: 2013-01-14 | Discharge: 2013-01-14 | Disposition: A | Discharge status: Home / Self Care | Payer: Medicare Other | Source: Home / Self Care

## 2013-01-14 ENCOUNTER — Encounter (HOSPITAL_COMMUNITY): Payer: Self-pay | Admitting: Emergency Medicine

## 2013-01-14 DIAGNOSIS — R319 Hematuria, unspecified: Secondary | ICD-10-CM

## 2013-01-14 DIAGNOSIS — Z87448 Personal history of other diseases of urinary system: Secondary | ICD-10-CM

## 2013-01-14 DIAGNOSIS — Z87898 Personal history of other specified conditions: Secondary | ICD-10-CM

## 2013-01-14 NOTE — ED Provider Notes (Signed)
History     CSN: [id number]  Arrival date & time 01/14/13  1001   None     Chief Complaint  Patient presents with  . 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 He often has to cath himself at home but other times may more naturally. Approximately 3 days ago and a few days before that he was having difficult T. with urination having a slow weak stream or the absence of urination altogether. Since that time he has been having to catheterize himself to obtain a voided bladder. He is complaining of gross hematuria but no fever, vomiting, and abdominal pelvic pain. Only slight occasional discomfort with attempted urination. He has a urologist but has missed several appointments with him.   Past Medical History  Diagnosis Date  . Hypertension     Past Surgical History  Procedure Laterality Date  . Fracture surgery    . Hip surgery      No family history on file.  History  Substance Use Topics  . Smoking status: Never Smoker   . Smokeless tobacco: Not on file  . Alcohol Use: No      Review of Systems  Constitutional: Negative for fever, activity change and fatigue.  HENT: Negative.   Respiratory: Negative.   Cardiovascular: Negative.   Gastrointestinal: Negative.   Genitourinary: Positive for hematuria and difficulty urinating. Negative for frequency and flank pain.  Neurological: Negative.     Allergies  Review of patient's allergies indicates no known allergies.  Home Medications   Current Outpatient Rx  Name  Route  Sig  Dispense  Refill  . aspirin EC 81 MG tablet   Oral   Take 81 mg by mouth daily.         . furosemide (LASIX) 40 MG tablet   Oral   Take 1 tablet (40 mg total) by mouth daily.   30 tablet   11     Pt is due for F/U OV with MD for  refills-last OV 0 ...   . losartan (COZAAR) 50 MG tablet   Oral   Take 1 tablet (50 mg total) by mouth daily.   90 tablet   3     BP 140/74  Pulse 102  Temp(Src) 97.7 F (36.5 C)  Resp 16  SpO2 100%  Physical Exam  Nursing note and vitals reviewed. Constitutional: He is oriented to person, place, and time. He appears well-developed and well-nourished. No distress.  Neck: Neck supple.  Cardiovascular: Normal rate, regular rhythm and normal heart sounds.   Pulmonary/Chest: Effort normal and breath sounds normal. No respiratory distress.  Abdominal: Soft. He exhibits no distension and no mass. There is no tenderness. There is no rebound and no guarding.  Genitourinary:  The abdomen and pelvis is flat. There is no evidence of bladder distention or other mass.  Musculoskeletal: He exhibits no edema.  Neurological: He is alert and oriented to person, place, and time. He exhibits normal muscle tone.  Skin: Skin is warm and dry.  Psychiatric: He has a normal mood and affect.    ED Course  Procedures (including critical care time)  Labs Reviewed - No data to display No results found.   1. Hematuria   2. History of urinary retention       MDM  I was able to contact [facility] and obtain an appointment with his urologist  at 1:45 PM today. Since he does not have a distended bladder and he does not have the sensation to avoid he can be discharged to go to an appointment at 145. Is  currently in no distress and is stable. The name and address have been written down and get into his significant other.        Hayden Rasmussen, NP 01/14/13 1104

## 2013-01-14 NOTE — ED Provider Notes (Signed)
Medical screening examination/treatment/procedure(s) were performed by non-physician practitioner and as supervising physician I was immediately available for consultation/collaboration.  Raynald Blend, MD 01/14/13 1146

## 2013-01-14 NOTE — ED Notes (Signed)
Blood in urine, onset 6/16.  Patient reports history of the same.  Cannot advise of diagnosis made at that time.  Patient does void on his own, but self-caths several times each day to "empty bladder".  Denies any pain

## 2013-01-20 ENCOUNTER — Encounter: Payer: Medicare Other | Admitting: Internal Medicine

## 2013-04-27 ENCOUNTER — Encounter (HOSPITAL_COMMUNITY): Payer: Self-pay | Admitting: Adult Health

## 2013-04-27 ENCOUNTER — Emergency Department (HOSPITAL_COMMUNITY)
Admission: EM | Admit: 2013-04-27 | Discharge: 2013-04-28 | Disposition: A | Discharge status: Home / Self Care | Payer: Medicare Other | Source: Home / Self Care | Attending: Emergency Medicine | Admitting: Emergency Medicine

## 2013-04-27 DIAGNOSIS — R319 Hematuria, unspecified: Secondary | ICD-10-CM

## 2013-04-27 DIAGNOSIS — N390 Urinary tract infection, site not specified: Secondary | ICD-10-CM

## 2013-04-27 DIAGNOSIS — R339 Retention of urine, unspecified: Secondary | ICD-10-CM

## 2013-04-27 HISTORY — DX: Retention of urine, unspecified: R33.9

## 2013-04-27 NOTE — ED Notes (Signed)
Presents with hematuria, has been having this problem off and on for a while, self caths at home for residual but now is unable to get any urine out due to blood and blood clots in catheter and in urine. Has not emptied bladder since 2-3pm today. Pt denies pain.

## 2013-04-27 NOTE — ED Notes (Signed)
Secretary informed to contact materials to request a 3-way foley catheter for the pt. 6700/5W consulted upon kidney irrigation.

## 2013-04-27 NOTE — ED Notes (Signed)
MD at bedside. 

## 2013-04-27 NOTE — ED Notes (Signed)
Bladder scan completed, bladder scan read >293ml.

## 2013-04-27 NOTE — ED Provider Notes (Signed)
CSN: [id number]     Arrival date & time 04/27/13  2133 History   First MD Initiated Contact with Patient 04/27/13 2311     Chief Complaint  Patient presents with  . Hematuria   (Consider location/radiation/quality/duration/timing/severity/associated sxs/prior Treatment) HPI Comments: [name] Bennett is a 79 y.o. male who complains of inability to catheterize self secondary to blood clots in bladder. He has tried twice to catheterize himself this afternoon without relief. There were no preceding problems Early this morning. He denies fever, chills, nausea, vomiting, weakness, or dizziness. He has chronic inability to void, requiring self-catheterization several times each day. He has not had this problem recently. There are no other known modifying factors.   Patient is a 79 y.o. male presenting with hematuria. The history is provided by the patient.  Hematuria    Past Medical History  Diagnosis Date  . Hypertension   . Urinary retention    Past Surgical History  Procedure Laterality Date  . Fracture surgery    . Hip surgery     History reviewed. No pertinent family history. History  Substance Use Topics  . Smoking status: Never Smoker   . Smokeless tobacco: Not on file  . Alcohol Use: No    Review of Systems  Genitourinary: Positive for hematuria.  All other systems reviewed and are negative.    Allergies  Review of patient's allergies indicates no known allergies.  Home Medications   Current Outpatient Rx  Name  Route  Sig  Dispense  Refill  . aspirin EC 81 MG tablet   Oral   Take 81 mg by mouth daily.         . furosemide (LASIX) 40 MG tablet   Oral   Take 1 tablet (40 mg total) by mouth daily.   30 tablet   11     Pt is due for F/U OV with MD for refills-last OV 0 ...   . losartan (COZAAR) 50 MG tablet   Oral   Take 1 tablet (50 mg total) by mouth daily.   90 tablet   3    BP 166/66  Pulse 61  Temp(Src) 97.8 F (36.6 C) (Oral)  Resp 15   SpO2 100% Physical Exam  Nursing note and vitals reviewed. Constitutional: He is oriented to person, place, and time. He appears well-developed and well-nourished.  HENT:  Head: Normocephalic and atraumatic.  Right Ear: External ear normal.  Left Ear: External ear normal.  Eyes: Conjunctivae and EOM are normal. Pupils are equal, round, and reactive to light.  Neck: Normal range of motion and phonation normal. Neck supple.  Cardiovascular: Normal rate, regular rhythm, normal heart sounds and intact distal pulses.   Pulmonary/Chest: Effort normal and breath sounds normal. He exhibits no bony tenderness.  Abdominal: Soft. Normal appearance and bowel sounds are normal. He exhibits no mass. There is no tenderness.  Urinary  Bladder could not be palpated  Musculoskeletal: Normal range of motion.  Neurological: He is alert and oriented to person, place, and time. No cranial nerve deficit or sensory deficit. He exhibits normal muscle tone. Coordination normal.  Skin: Skin is warm, dry and intact.  Psychiatric: He has a normal mood and affect. His behavior is normal. Judgment and thought content normal.    ED Course  Procedures (including critical care time)  18-gauge Foley catheter, inserted for drainage and irrigation He was irrigated with good flow of urine, but the urine remained very bloody.  Three-way catheter was inserted for  continuous irrigation  Treated with Keflex for UTI  6:54 AM Reevaluation with update and discussion. After initial assessment and treatment, an updated evaluation reveals pt is comfortable now, urine is light pink in color. Janzen Sacks L   Patient Vitals for the past 24 hrs:  BP Temp Temp src Pulse Resp SpO2  04/28/13 0437 166/66 mmHg 97.8 F (36.6 C) Oral 61 15 100 %  04/28/13 0245 177/89 mmHg - - 60 21 100 %  04/28/13 0215 183/72 mmHg - - 61 21 100 %  04/28/13 0145 160/62 mmHg - - 54 14 100 %  04/28/13 0115 158/69 mmHg - - 58 21 100 %  04/28/13 0045  151/79 mmHg - - 64 16 100 %  04/28/13 0015 165/65 mmHg - - 64 19 100 %  04/27/13 2345 161/72 mmHg - - 62 18 100 %  04/27/13 2300 140/72 mmHg - - 66 20 100 %  04/27/13 2230 146/75 mmHg - - - 20 100 %  04/27/13 2220 177/76 mmHg - - - 17 100 %  04/27/13 2140 189/70 mmHg 98.5 F (36.9 C) Oral 76 18 98 %   6:55 AM-Consult complete with Dr Isabel Caprice. Patient case explained and discussed. He agrees that patient can be discharged, with a 3 way urinary catheter, then followup with his urologist in 2 days,  for further evaluation and treatment. Call ended at 0705  Labs Review Labs Reviewed  URINALYSIS, ROUTINE W REFLEX MICROSCOPIC - Abnormal; Notable for the following:    Color, Urine RED (*)    APPearance CLOUDY (*)    Hgb urine dipstick LARGE (*)    Protein, ur >300 (*)    Nitrite POSITIVE (*)    Leukocytes, UA SMALL (*)    All other components within normal limits  URINE MICROSCOPIC-ADD ON - Abnormal; Notable for the following:    Squamous Epithelial / LPF MANY (*)    Casts GRANULAR CAST (*)    All other components within normal limits  URINE CULTURE   I  MDM   1. UTI (lower urinary tract infection)   2. Hematuria   3. Urinary retention    Urinary retention with hematuria and UTI. Patient improved with treatment in stable for discharge. Doubt urosepsis, metabolic instability, or impending vascular collapse.  Nursing Notes Reviewed/ Care Coordinated, and agree without changes. Applicable Imaging Reviewed.  Interpretation of Laboratory Data incorporated into ED treatment   Plan: Home Medications- Keflex; Home Treatments and Observation- Foley catheter care at home, three-way catheter will be left in for possible use, if needed for additional irrigation, if the retention returns.; return here if the recommended treatment, does not improve the symptoms; Recommended follow up- urology followup in 2-3 days.      Flint Melter, MD 04/28/13 [phone]

## 2013-04-28 ENCOUNTER — Inpatient Hospital Stay (HOSPITAL_COMMUNITY)
Admission: EM | Admit: 2013-04-28 | Discharge: 2013-05-03 | DRG: 469 | Disposition: A | Discharge status: Skilled Nursing Facility | Payer: Medicare Other | Attending: Internal Medicine | Admitting: Internal Medicine

## 2013-04-28 ENCOUNTER — Emergency Department (HOSPITAL_COMMUNITY): Payer: Medicare Other

## 2013-04-28 ENCOUNTER — Encounter (HOSPITAL_COMMUNITY): Payer: Self-pay | Admitting: *Deleted

## 2013-04-28 ENCOUNTER — Observation Stay (HOSPITAL_COMMUNITY): Payer: Medicare Other

## 2013-04-28 DIAGNOSIS — Z681 Body mass index (BMI) 19 or less, adult: Secondary | ICD-10-CM

## 2013-04-28 DIAGNOSIS — R55 Syncope and collapse: Secondary | ICD-10-CM

## 2013-04-28 DIAGNOSIS — R634 Abnormal weight loss: Secondary | ICD-10-CM

## 2013-04-28 DIAGNOSIS — R5381 Other malaise: Secondary | ICD-10-CM

## 2013-04-28 DIAGNOSIS — Z79899 Other long term (current) drug therapy: Secondary | ICD-10-CM

## 2013-04-28 DIAGNOSIS — R339 Retention of urine, unspecified: Secondary | ICD-10-CM

## 2013-04-28 DIAGNOSIS — W19XXXA Unspecified fall, initial encounter: Secondary | ICD-10-CM | POA: Diagnosis present

## 2013-04-28 DIAGNOSIS — D509 Iron deficiency anemia, unspecified: Secondary | ICD-10-CM | POA: Diagnosis present

## 2013-04-28 DIAGNOSIS — S72033A Displaced midcervical fracture of unspecified femur, initial encounter for closed fracture: Principal | ICD-10-CM | POA: Diagnosis present

## 2013-04-28 DIAGNOSIS — R319 Hematuria, unspecified: Secondary | ICD-10-CM | POA: Diagnosis present

## 2013-04-28 DIAGNOSIS — M79605 Pain in left leg: Secondary | ICD-10-CM

## 2013-04-28 DIAGNOSIS — I10 Essential (primary) hypertension: Secondary | ICD-10-CM | POA: Diagnosis present

## 2013-04-28 DIAGNOSIS — Y92009 Unspecified place in unspecified non-institutional (private) residence as the place of occurrence of the external cause: Secondary | ICD-10-CM

## 2013-04-28 DIAGNOSIS — S72009A Fracture of unspecified part of neck of unspecified femur, initial encounter for closed fracture: Secondary | ICD-10-CM

## 2013-04-28 DIAGNOSIS — D649 Anemia, unspecified: Secondary | ICD-10-CM

## 2013-04-28 DIAGNOSIS — R310 Gross hematuria: Secondary | ICD-10-CM | POA: Diagnosis present

## 2013-04-28 DIAGNOSIS — R531 Weakness: Secondary | ICD-10-CM | POA: Diagnosis present

## 2013-04-28 DIAGNOSIS — E43 Unspecified severe protein-calorie malnutrition: Secondary | ICD-10-CM | POA: Insufficient documentation

## 2013-04-28 DIAGNOSIS — K5900 Constipation, unspecified: Secondary | ICD-10-CM | POA: Diagnosis present

## 2013-04-28 DIAGNOSIS — N390 Urinary tract infection, site not specified: Secondary | ICD-10-CM | POA: Diagnosis present

## 2013-04-28 DIAGNOSIS — N320 Bladder-neck obstruction: Secondary | ICD-10-CM | POA: Diagnosis present

## 2013-04-28 LAB — URINE MICROSCOPIC-ADD ON

## 2013-04-28 LAB — CBC
HCT: 30.7 % — ABNORMAL LOW (ref 39.0–52.0)
MCV: 90 fL (ref 78.0–100.0)
RBC: 3.41 MIL/uL — ABNORMAL LOW (ref 4.22–5.81)
RDW: 14.4 % (ref 11.5–15.5)
WBC: 8.7 10*3/uL (ref 4.0–10.5)

## 2013-04-28 LAB — URINALYSIS, ROUTINE W REFLEX MICROSCOPIC
Ketones, ur: NEGATIVE mg/dL
Nitrite: POSITIVE — AB
pH: 8 (ref 5.0–8.0)

## 2013-04-28 LAB — DIFFERENTIAL
Basophils Absolute: 0 10*3/uL (ref 0.0–0.1)
Basophils Relative: 0 % (ref 0–1)
Eosinophils Relative: 0 % (ref 0–5)
Lymphocytes Relative: 5 % — ABNORMAL LOW (ref 12–46)
Monocytes Absolute: 0.4 10*3/uL (ref 0.1–1.0)
Neutro Abs: 7.8 10*3/uL — ABNORMAL HIGH (ref 1.7–7.7)
Neutrophils Relative %: 90 % — ABNORMAL HIGH (ref 43–77)

## 2013-04-28 LAB — POCT I-STAT, CHEM 8
HCT: 32 % — ABNORMAL LOW (ref 39.0–52.0)
Hemoglobin: 10.9 g/dL — ABNORMAL LOW (ref 13.0–17.0)
Potassium: 4.2 mEq/L (ref 3.5–5.1)
Sodium: 138 mEq/L (ref 135–145)
TCO2: 20 mmol/L (ref 0–100)

## 2013-04-28 LAB — COMPREHENSIVE METABOLIC PANEL
ALT: 11 U/L (ref 0–53)
AST: 21 U/L (ref 0–37)
Albumin: 3.9 g/dL (ref 3.5–5.2)
Alkaline Phosphatase: 59 U/L (ref 39–117)
CO2: 21 mEq/L (ref 19–32)
Calcium: 10.4 mg/dL (ref 8.4–10.5)
Chloride: 99 mEq/L (ref 96–112)
Creatinine, Ser: 1.07 mg/dL (ref 0.50–1.35)
GFR calc non Af Amer: 58 mL/min — ABNORMAL LOW (ref >90)
Potassium: 4.1 mEq/L (ref 3.5–5.1)
Sodium: 134 mEq/L — ABNORMAL LOW (ref 135–145)
Total Bilirubin: 0.6 mg/dL (ref 0.3–1.2)
Total Protein: 7.6 g/dL (ref 6.0–8.3)

## 2013-04-28 LAB — GLUCOSE, CAPILLARY: Glucose-Capillary: 69 mg/dL — ABNORMAL LOW (ref 70–99)

## 2013-04-28 LAB — RETICULOCYTES
RBC.: 3.28 MIL/uL — ABNORMAL LOW (ref 4.22–5.81)
Retic Count, Absolute: 16.4 10*3/uL — ABNORMAL LOW (ref 19.0–186.0)
Retic Ct Pct: 0.5 % (ref 0.4–3.1)

## 2013-04-28 LAB — POCT I-STAT TROPONIN I: Troponin i, poc: 0.03 ng/mL (ref 0.00–0.08)

## 2013-04-28 LAB — ETHANOL: Alcohol, Ethyl (B): <11 mg/dL (ref 0–11)

## 2013-04-28 LAB — CK: Total CK: 297 U/L — ABNORMAL HIGH (ref 7–232)

## 2013-04-28 LAB — APTT: aPTT: 26 seconds (ref 24–37)

## 2013-04-28 MED ORDER — SODIUM CHLORIDE 0.9 % IJ SOLN
3.0000 mL | Freq: Two times a day (BID) | INTRAMUSCULAR | Status: DC
  Administered 2013-04-28: 3 mL via INTRAVENOUS

## 2013-04-28 MED ORDER — CEPHALEXIN 250 MG PO CAPS
500.0000 mg | ORAL_CAPSULE | Freq: Once | ORAL | Status: AC
  Administered 2013-04-28: 500 mg via ORAL
  Filled 2013-04-28: qty 2

## 2013-04-28 MED ORDER — CEPHALEXIN 500 MG PO CAPS: 500.0000 mg | ORAL_CAPSULE | Freq: Four times a day (QID) | ORAL | Status: DC

## 2013-04-28 MED ORDER — ACETAMINOPHEN 650 MG RE SUPP: 650.0000 mg | Freq: Four times a day (QID) | RECTAL | Status: DC | PRN

## 2013-04-28 MED ORDER — SODIUM CHLORIDE 0.9 % IJ SOLN
3.0000 mL | Freq: Two times a day (BID) | INTRAMUSCULAR | Status: DC
  Administered 2013-04-29: 10:00:00 3 mL via INTRAVENOUS

## 2013-04-28 MED ORDER — SODIUM CHLORIDE 0.9 % IJ SOLN: 3.0000 mL | INTRAMUSCULAR | Status: DC | PRN

## 2013-04-28 MED ORDER — CEPHALEXIN 500 MG PO CAPS
500.0000 mg | ORAL_CAPSULE | Freq: Four times a day (QID) | ORAL | Status: DC
  Administered 2013-04-28 – 2013-04-30 (×6): 500 mg via ORAL
  Filled 2013-04-28 (×9): qty 1

## 2013-04-28 MED ORDER — ACETAMINOPHEN 325 MG PO TABS: 650.0000 mg | ORAL_TABLET | Freq: Four times a day (QID) | ORAL | Status: DC | PRN

## 2013-04-28 MED ORDER — SODIUM CHLORIDE 0.9 % IV SOLN: 250.0000 mL | INTRAVENOUS | Status: DC | PRN

## 2013-04-28 NOTE — ED Notes (Addendum)
Pt's bladder irrigated, 1200 cc of normal saline instilled, 1500 returned. Returned urine dark red.

## 2013-04-28 NOTE — ED Notes (Signed)
[name] Bennett (Niece): [phone]

## 2013-04-28 NOTE — Progress Notes (Signed)
EDCM spoke to patient and patient's niece Ms. Susa Raring phone number [phone] at bedside regarding possible home health needs.  Patient states, "This is my favorite niece."   Patient has given permission for [facility] to speak his niece regarding his medical issues and personal care.  Patient reports that he has lived alone since 1997.  Patient reports that since before this "little incident"  he was able to feed, cook, dress and ambulate himself without difficulty.  Patient reports that he has two nieces and neighbors for support.  Patient reports hea has a walker at home.  EDCM provided list of home health agencies to patient in [place].  EDCM explained that with home health he may receive a visiting RN, PT, OT, nursing aide and a Child psychotherapist.  The social worker will be put in place if the patient needs placement to a rehab or nursing facility.  EDCM also provided patient a list of private duty care agencies and explained that this would be an out of pocket expense for him.  Patient verbalized understanding and thankful for resources.  As [facility] left patient's room, patient's niece pulled EDCM aside stating, "I need to tell you something personally."  Patient's niece went on to explain that she is concerned about his living conditions.  Patient's niece stated, "He doesn't have a stove, or a refridgerator.  The plumbing is bad, the coomode doesn't flush.  He has to get water from the sink to get the toilet to flush."  Patient's niece stated he does have running water.  Patient's niece stated, "I would like to keep this between me and you.  He is so stubborn and hard headed, he won't let anyone in the house to help him."  EDCM explained to patient's niece that this will be documented in his chart.  In order for this facility to make a safe discharge, we need to know what his living situation is.  EDCM went on to say that the patient is not confused and can make his own decisions, so whatever he says goes.   Patient's niece verbalized understanding.  Patient's niece stated, "I have the keys to his house and I am planning to call the weathering agency to come and and see if there is anything they can do to help.  He is always so concerned with how much something is going to cost."  Patient's niece and her sister,"Have been concerned about this for a long time."  [facility] provided patient's niece with phone number to the Department of Social Services.  EDCM also explained that since this information will be documented in the patient's chart, there is a chance that this issue will be brought up about his living conditions.  Patient's niece Marily Memos agreed that it would be okay to call her first prior to speaking to patient about his living conditions so that she may be present and included in the converstaion so the patient didn't feel he was being attacked.  Patient's niece feels that if she was present during the conversation, she would be able to convince him to fix the living situation at home.  EDCM offered to have a Child psychotherapist come and speak to the patient this evening but patient's niece refused, again wanting to keep this issue confidential.  EDCM explained once again to patient's niece that the patient is not confused and able to make his own decisions, so if he doesn't want home health or assistance with his living situation at this time,  then that is his decision to make.  Patient's niece verbalized understanding and thankful for assistance.

## 2013-04-28 NOTE — ED Notes (Signed)
CBI started. Dark red blood returned.

## 2013-04-28 NOTE — ED Provider Notes (Addendum)
CSN: [id number]     Arrival date & time 04/28/13  1654 History   First MD Initiated Contact with Patient 04/28/13 1657     Chief Complaint  Patient presents with  . Weakness    HPI Patient presents to the emergency room with complaints of weakness. Patient states he was sitting on the porch. He got up to go to the mailbox when all of a sudden his left leg felt weak. The meds report indicates the patient fell backwards however the patient tells me that he did not actually fall onto the ground. Patient states he had difficulty standing. He denies any trouble with his speech. He denies any trouble with weakness in his arms. He continues to complain of some weakness in his left leg.  He denies any pain in his feet or ankles.  He denies pain in his hips although it does feel a little sore when he lifts his left leg. Patient is unable to tell me the exact onset of the symptoms are when he last felt normal. Past Medical History  Diagnosis Date  . Hypertension   . Urinary retention    Past Surgical History  Procedure Laterality Date  . Fracture surgery    . Hip surgery     History reviewed. No pertinent family history. History  Substance Use Topics  . Smoking status: Never Smoker   . Smokeless tobacco: Not on file  . Alcohol Use: No    Review of Systems  All other systems reviewed and are negative.    Allergies  Review of patient's allergies indicates no known allergies.  Home Medications   Current Outpatient Rx  Name  Route  Sig  Dispense  Refill  . aspirin EC 81 MG tablet   Oral   Take 81 mg by mouth daily.         . cephALEXin (KEFLEX) 500 MG capsule   Oral   Take 1 capsule (500 mg total) by mouth 4 (four) times daily.   28 capsule   0   . furosemide (LASIX) 40 MG tablet   Oral   Take 1 tablet (40 mg total) by mouth daily.   30 tablet   11     Pt is due for F/U OV with MD for refills-last OV 0 ...   . losartan (COZAAR) 50 MG tablet   Oral   Take 1 tablet (50 mg  total) by mouth daily.   90 tablet   3    BP 158/65  Pulse 73  Temp(Src) 98.8 F (37.1 C) (Oral)  Resp 24  SpO2 98% Physical Exam  Nursing note and vitals reviewed. Constitutional: He is oriented to person, place, and time. He appears well-developed and well-nourished. No distress.  HENT:  Head: Normocephalic and atraumatic.  Right Ear: External ear normal.  Left Ear: External ear normal.  Mouth/Throat: Oropharynx is clear and moist.  Eyes: Conjunctivae are normal. Right eye exhibits no discharge. Left eye exhibits no discharge. No scleral icterus.  Neck: Neck supple. No tracheal deviation present.  Cardiovascular: Normal rate, regular rhythm and intact distal pulses.   Pulmonary/Chest: Effort normal and breath sounds normal. No stridor. No respiratory distress. He has no wheezes. He has no rales.  Abdominal: Soft. Bowel sounds are normal. He exhibits no distension. There is no tenderness. There is no rebound and no guarding.  Genitourinary:  Foley catheter bag with sanguinous appearing fluid  Musculoskeletal: He exhibits no edema and no tenderness.  No ttp bilateral hips,  no ttp pelvic compression  Neurological: He is alert and oriented to person, place, and time. He has normal strength. No cranial nerve deficit ( no gross defecits noted) or sensory deficit. He exhibits normal muscle tone. He displays no seizure activity. Coordination normal.  No pronator drift bilateral upper extrem, able to hold both legs off bed for 5 seconds although with more difficulty on the left leg, sensation intact in all extremities, no visual field cuts, no left or right sided neglect  Skin: Skin is warm and dry. No rash noted.  Psychiatric: He has a normal mood and affect.    ED Course  Procedures (including critical care time) EKG Reviewed Poor quality, artifact, needs repeating, probable NSR Labs Review Labs Reviewed  CBC - Abnormal; Notable for the following:    RBC 3.41 (*)    Hemoglobin  10.0 (*)    HCT 30.7 (*)    All other components within normal limits  DIFFERENTIAL - Abnormal; Notable for the following:    Neutrophils Relative % 90 (*)    Neutro Abs 7.8 (*)    Lymphocytes Relative 5 (*)    Lymphs Abs 0.5 (*)    All other components within normal limits  COMPREHENSIVE METABOLIC PANEL - Abnormal; Notable for the following:    Sodium 134 (*)    BUN 28 (*)    GFR calc non Af Amer 58 (*)    GFR calc Af Amer 67 (*)    All other components within normal limits  GLUCOSE, CAPILLARY - Abnormal; Notable for the following:    Glucose-Capillary 69 (*)    All other components within normal limits  POCT I-STAT, CHEM 8 - Abnormal; Notable for the following:    BUN 28 (*)    Hemoglobin 10.9 (*)    HCT 32.0 (*)    All other components within normal limits  PROTIME-INR  APTT  TROPONIN I  ETHANOL  URINE RAPID DRUG SCREEN (HOSP PERFORMED)  URINALYSIS, ROUTINE W REFLEX MICROSCOPIC  POCT I-STAT TROPONIN I   Imaging Review Dg Pelvis 1-2 Views  04/28/2013   CLINICAL DATA:  Left hip pain post fall  EXAM: PELVIS - 1-2 VIEW  COMPARISON:  12/05/2006  FINDINGS: Left hip is slightly rotated.  Interval right hip replacement.  Osseous demineralization.  SI joints and left hip joint space preserved.  No acute fracture, dislocation, or bone destruction.  Scattered atherosclerotic calcifications.  IMPRESSION: Osseous demineralization.  No definite acute left hip abnormalities.  Interval right hip arthroplasty.   Electronically Signed   By: Ulyses Southward M.D.   On: 04/28/2013 18:10   Ct Head Wo Contrast  04/28/2013   CLINICAL DATA:  Larey Seat backwards, left leg pain, denies loss of consciousness, history hypertension  EXAM: CT HEAD WITHOUT CONTRAST  TECHNIQUE: Contiguous axial images were obtained from the base of the skull through the vertex without intravenous contrast.  COMPARISON:  None  FINDINGS: Scattered motion artifacts.  Generalized atrophy. Normal ventricular morphology.  No midline shift or  mass effect.  Small vessel chronic ischemic changes of deep cerebral white matter.  Scattered falcine calcification.  No intracranial hemorrhage, mass lesion, or evidence acute infarction.  No extra-axial fluid collections.  Atherosclerotic calcification of internal carotid and vertebral arteries at skullbase.  Bones and sinuses unremarkable within limits of motion.  IMPRESSION: Atrophy with small vessel chronic ischemic changes in deep cerebral white matter.  No acute intracranial abnormalities.   Electronically Signed   By: Angelyn Punt.D.  On: 04/28/2013 18:15   2006  Pt in no distress. Drinking juice.  Updated family. MDM   1. Near syncope   2. Weakness   3. Hematuria    Symptoms may be multifactorial.  He may be weak associated with his possible uti and hematuria.  It is possible pt has had a small stroke. He is not a tpa candidate based on uncertainty of the onset and the mild nature of his symptoms. Will admit for obervation, cardiac monitor.    Celene Kras, MD 04/28/13 2007  Celene Kras, MD 04/28/13 [phone]

## 2013-04-28 NOTE — ED Notes (Signed)
MD at bedside. Dr. Wentz. 

## 2013-04-28 NOTE — ED Notes (Signed)
Pt given decaf coffee. Awaiting family for transportation home.

## 2013-04-28 NOTE — ED Notes (Signed)
Per ems pt went  out on to get his mail fell backward and now c/o lt leg pain(Femur) denies loc was seen at 04/27/2013 for blood in his urine

## 2013-04-28 NOTE — ED Notes (Signed)
Pt has >414 cc in bladder per bladder scanner, will irrigate bladder per md order

## 2013-04-28 NOTE — ED Notes (Signed)
At time this tech unable to get urine from catheter, md made aware and okay with not getting urine at this time.

## 2013-04-28 NOTE — ED Notes (Signed)
Pt alert and oriented x4. Respirations even and unlabored, bilateral symmetrical rise and fall of chest. Skin warm and dry. In no acute distress. Denies needs.   

## 2013-04-28 NOTE — ED Notes (Addendum)
A total of 1100 cc of urine pulled off of pt's bladder.

## 2013-04-28 NOTE — ED Notes (Signed)
Care transferred, report received Augusto Gamble, RN.

## 2013-04-28 NOTE — ED Notes (Signed)
Irrigatted foley with 200 cc sterile water, noted pt has 650 output at this time. Clamped for urine sample

## 2013-04-28 NOTE — ED Notes (Signed)
Bladder irrigated with of saline, bloody urine returned.

## 2013-04-28 NOTE — H&P (Signed)
PCP:   [name], MD   Chief Complaint:  Larey Seat on porch, very weak  HPI: 79 yo male very independent lives at home alone, niece with him today, comes in after falling while trying to walk up his porch steps today and his left leg "gave out".  No loc or syncope.  Pt was in ED last night with gross hematuria, was dx with uti placed on keflex and scheduled for f/u outpt with urology on Friday.  Has catheter in with gross hematuria.  Pt denies any fevers.  No light headedness.  No abd pain or suprapubic pain.  Denies any pain after the fall, but is in pain when moves left leg, says it hurts in his inner thigh area.  No n/v.  Eating and drinking well.  His hgb is over 10, same as last nights check.  No slurred speech, difficulty moving any extremeties other than pain in left thigh.  No confusion per niece who is present with him today.  Review of Systems:  Positive and negative as per HPI otherwise all other systems are negative  Past Medical History: Past Medical History  Diagnosis Date  . Hypertension   . Urinary retention    Past Surgical History  Procedure Laterality Date  . Fracture surgery    . Hip surgery      Medications: Prior to Admission medications   Medication Sig Start Date End Date Taking? Authorizing Provider  aspirin EC 81 MG tablet Take 81 mg by mouth daily.   Yes Historical Provider, MD  cephALEXin (KEFLEX) 500 MG capsule Take 1 capsule (500 mg total) by mouth 4 (four) times daily. 04/28/13  Yes Flint Melter, MD  furosemide (LASIX) 40 MG tablet Take 1 tablet (40 mg total) by mouth daily. 10/13/12  Yes Jacques Navy, MD  losartan (COZAAR) 50 MG tablet Take 1 tablet (50 mg total) by mouth daily. 07/31/12  Yes Jacques Navy, MD    Allergies:  No Known Allergies  Social History:  reports that he has never smoked. He does not have any smokeless tobacco history on file. He reports that he does not drink alcohol or use illicit drugs.  Family History: History  reviewed. No pertinent family history.  Physical Exam: Filed Vitals:   04/28/13 1835 04/28/13 1859 04/28/13 1934 04/28/13 2011  BP:   158/65   Pulse: 73     Temp:   98.8 F (37.1 C) 98.9 F (37.2 C)  TempSrc:   Oral   Resp: 23 24 24    SpO2: 98%  98%    General appearance: alert, cooperative and no distress Head: Normocephalic, without obvious abnormality, atraumatic Eyes: negative Nose: Nares normal. Septum midline. Mucosa normal. No drainage or sinus tenderness. Neck: no JVD and supple, symmetrical, trachea midline Lungs: clear to auscultation bilaterally Heart: regular rate and rhythm, S1, S2 normal, no murmur, click, rub or gallop Abdomen: soft, non-tender; bowel sounds normal; no masses,  no organomegaly foley with gross hematuria Extremities: extremities normal, atraumatic, no cyanosis or edema pain with active movement of left thigh Pulses: 2+ and symmetric Skin: Skin color, texture, turgor normal. No rashes or lesions Neurologic: Grossly normal    Labs on Admission:   Recent Labs  04/28/13 1845 04/28/13 1900  NA 134* 138  K 4.1 4.2  CL 99 105  CO2 21  --   GLUCOSE 75 76  BUN 28* 28*  CREATININE 1.07 1.30  CALCIUM 10.4  --     Recent Labs  04/28/13 1845  AST 21  ALT 11  ALKPHOS 59  BILITOT 0.6  PROT 7.6  ALBUMIN 3.9    Recent Labs  04/28/13 1845 04/28/13 1900  WBC 8.7  --   NEUTROABS 7.8*  --   HGB 10.0* 10.9*  HCT 30.7* 32.0*  MCV 90.0  --   PLT 185  --     Recent Labs  04/28/13 1845  TROPONINI <0.30    Radiological Exams on Admission: Dg Pelvis 1-2 Views  04/28/2013   CLINICAL DATA:  Left hip pain post fall  EXAM: PELVIS - 1-2 VIEW  COMPARISON:  12/05/2006  FINDINGS: Left hip is slightly rotated.  Interval right hip replacement.  Osseous demineralization.  SI joints and left hip joint space preserved.  No acute fracture, dislocation, or bone destruction.  Scattered atherosclerotic calcifications.  IMPRESSION: Osseous  demineralization.  No definite acute left hip abnormalities.  Interval right hip arthroplasty.   Electronically Signed   By: Ulyses Southward M.D.   On: 04/28/2013 18:10   Ct Head Wo Contrast  04/28/2013   CLINICAL DATA:  Larey Seat backwards, left leg pain, denies loss of consciousness, history hypertension  EXAM: CT HEAD WITHOUT CONTRAST  TECHNIQUE: Contiguous axial images were obtained from the base of the skull through the vertex without intravenous contrast.  COMPARISON:  None  FINDINGS: Scattered motion artifacts.  Generalized atrophy. Normal ventricular morphology.  No midline shift or mass effect.  Small vessel chronic ischemic changes of deep cerebral white matter.  Scattered falcine calcification.  No intracranial hemorrhage, mass lesion, or evidence acute infarction.  No extra-axial fluid collections.  Atherosclerotic calcification of internal carotid and vertebral arteries at skullbase.  Bones and sinuses unremarkable within limits of motion.  IMPRESSION: Atrophy with small vessel chronic ischemic changes in deep cerebral white matter.  No acute intracranial abnormalities.   Electronically Signed   By: Ulyses Southward M.D.   On: 04/28/2013 18:15    Assessment/Plan  79 yo elderly male with gross hematuria, stable anemia, uti, left thigh pain Principal Problem:   Weakness generalized Active Problems:   HYPERTENSION, MILD   Hematuria   UTI (lower urinary tract infection)   Anemia   Left leg pain  Repeat ekg pending, initial poor quality but looks like nsr with no acute issues rate in 90s.  hgb is stable.  Ck orthostatic.  Pelvic xrays in ED are neg, will xray left femur to r/o occult fracture from fall.  Neuro cks overnight, nothing focal on exam.  Cont keflex for uti, has appt with urology on Friday.  Foley with gross hematuria present.  Hold asa products.  Obtain PT eval.    Niece is concerned about his living situation.  He does not have working plumbing, no working toilet.  No refrigerator.  Will  need to get SW/CM involved to provide her with resources if available to aid him.  Pt very onery and probably not likely to accept any help.  Would benefit from [facility] visit.  Discussed code status with him and he refused to discuss it.  Pt alert and oriented x 4 and seems competent.  Full code.  obs on tele.    Teyton Pattillo A 04/28/2013, 9:19 PM

## 2013-04-28 NOTE — ED Notes (Signed)
Family at bedside. 

## 2013-04-28 NOTE — ED Notes (Signed)
Bed: WA03 Expected date:  Expected time:  Means of arrival:  Comments: 79yo-foot pain

## 2013-04-28 NOTE — ED Notes (Signed)
Pt removed his IV

## 2013-04-29 ENCOUNTER — Observation Stay (HOSPITAL_COMMUNITY): Payer: Medicare Other

## 2013-04-29 ENCOUNTER — Encounter (HOSPITAL_COMMUNITY): Payer: Self-pay | Admitting: *Deleted

## 2013-04-29 LAB — CBC
Hemoglobin: 9 g/dL — ABNORMAL LOW (ref 13.0–17.0)
RBC: 3 MIL/uL — ABNORMAL LOW (ref 4.22–5.81)
WBC: 6.5 10*3/uL (ref 4.0–10.5)

## 2013-04-29 LAB — COMPREHENSIVE METABOLIC PANEL
AST: 16 U/L (ref 0–37)
Albumin: 3.1 g/dL — ABNORMAL LOW (ref 3.5–5.2)
Alkaline Phosphatase: 50 U/L (ref 39–117)
BUN: 21 mg/dL (ref 6–23)
Calcium: 9.4 mg/dL (ref 8.4–10.5)
Chloride: 102 mEq/L (ref 96–112)
GFR calc Af Amer: 80 mL/min — ABNORMAL LOW (ref >90)
Potassium: 4.1 mEq/L (ref 3.5–5.1)
Total Bilirubin: 0.8 mg/dL (ref 0.3–1.2)
Total Protein: 6.5 g/dL (ref 6.0–8.3)

## 2013-04-29 LAB — IRON AND TIBC
Iron: 20 ug/dL — ABNORMAL LOW (ref 42–135)
Saturation Ratios: 8 % — ABNORMAL LOW (ref 20–55)
UIBC: 225 ug/dL (ref 125–400)

## 2013-04-29 LAB — FERRITIN: Ferritin: 98 ng/mL (ref 22–322)

## 2013-04-29 LAB — BASIC METABOLIC PANEL
BUN: 26 mg/dL — ABNORMAL HIGH (ref 6–23)
CO2: 25 mEq/L (ref 19–32)
GFR calc Af Amer: 67 mL/min — ABNORMAL LOW (ref >90)
GFR calc non Af Amer: 58 mL/min — ABNORMAL LOW (ref >90)
Potassium: 4 mEq/L (ref 3.5–5.1)
Sodium: 135 mEq/L (ref 135–145)

## 2013-04-29 LAB — VITAMIN B12: Vitamin B-12: 714 pg/mL (ref 211–911)

## 2013-04-29 LAB — RAPID URINE DRUG SCREEN, HOSP PERFORMED
Amphetamines: NOT DETECTED
Barbiturates: NOT DETECTED
Tetrahydrocannabinol: NOT DETECTED

## 2013-04-29 LAB — FOLATE: Folate: >20 ng/mL

## 2013-04-29 LAB — PROTIME-INR
INR: 1.09 (ref 0.00–1.49)
INR: 1.17 (ref 0.00–1.49)
Prothrombin Time: 13.9 seconds (ref 11.6–15.2)

## 2013-04-29 MED ORDER — BISACODYL 10 MG RE SUPP: 10.0000 mg | Freq: Every day | RECTAL | Status: DC | PRN

## 2013-04-29 MED ORDER — SODIUM CHLORIDE 0.9 % IV SOLN
INTRAVENOUS | Status: DC
  Administered 2013-04-29 – 2013-05-02 (×7): via INTRAVENOUS
  Administered 2013-05-02: 11:00:00 1 mL via INTRAVENOUS

## 2013-04-29 MED ORDER — POLYETHYLENE GLYCOL 3350 17 G PO PACK
17.0000 g | PACK | Freq: Every day | ORAL | Status: DC
  Administered 2013-04-29 – 2013-04-30 (×2): 17 g via ORAL
  Filled 2013-04-29 (×5): qty 1

## 2013-04-29 NOTE — Consult Note (Signed)
Reason for Consult: Left hip fracture  Referring Physician:  Dr. Karlyne Greenspan is an 79 y.o. male.  HPI: patient being seen at the request of Dr Debby Bud for left hip fracture.  Pt states that he was walking up his porch yesterday and felt his leg "give away".  Had some pain in the hip but was still able to put some weight down.  Had a right hip fracture several years ago and has a hemiarthroplasty.  This hip is doing very well.  Patient lives alone and is extremely active.  No other complaints.    Past Medical History  Diagnosis Date  . Hypertension   . Urinary retention     Past Surgical History  Procedure Laterality Date  . Fracture surgery    . Hip surgery      History reviewed. No pertinent family history.  Social History:  reports that he has never smoked. He does not have any smokeless tobacco history on file. He reports that he does not drink alcohol or use illicit drugs.  Allergies: No Known Allergies  Medications: I have reviewed the patient's current medications.  Results for orders placed during the hospital encounter of 04/28/13 (from the past 48 hour(s))  ETHANOL     Status: None   Collection Time    04/28/13  6:45 PM      Result Value Range   Alcohol, Ethyl (B) <11  0 - 11 mg/dL   Comment:            LOWEST DETECTABLE LIMIT FOR     SERUM ALCOHOL IS 11 mg/dL     FOR MEDICAL PURPOSES ONLY     Performed at [facility]  PROTIME-INR     Status: None   Collection Time    04/28/13  6:45 PM      Result Value Range   Prothrombin Time 13.9  11.6 - 15.2 seconds   INR 1.09  0.00 - 1.49  APTT     Status: None   Collection Time    04/28/13  6:45 PM      Result Value Range   aPTT 26  24 - 37 seconds  CBC     Status: Abnormal   Collection Time    04/28/13  6:45 PM      Result Value Range   WBC 8.7  4.0 - 10.5 K/uL   RBC 3.41 (*) 4.22 - 5.81 MIL/uL   Hemoglobin 10.0 (*) 13.0 - 17.0 g/dL   HCT 40.9 (*) 81.1 - 91.4 %   MCV 90.0  78.0 -  100.0 fL   MCH 29.3  26.0 - 34.0 pg   MCHC 32.6  30.0 - 36.0 g/dL   RDW 78.2  95.6 - 21.3 %   Platelets 185  150 - 400 K/uL  DIFFERENTIAL     Status: Abnormal   Collection Time    04/28/13  6:45 PM      Result Value Range   Neutrophils Relative % 90 (*) 43 - 77 %   Neutro Abs 7.8 (*) 1.7 - 7.7 K/uL   Lymphocytes Relative 5 (*) 12 - 46 %   Lymphs Abs 0.5 (*) 0.7 - 4.0 K/uL   Monocytes Relative 5  3 - 12 %   Monocytes Absolute 0.4  0.1 - 1.0 K/uL   Eosinophils Relative 0  0 - 5 %   Eosinophils Absolute 0.0  0.0 - 0.7 K/uL   Basophils Relative 0  0 -  1 %   Basophils Absolute 0.0  0.0 - 0.1 K/uL  COMPREHENSIVE METABOLIC PANEL     Status: Abnormal   Collection Time    04/28/13  6:45 PM      Result Value Range   Sodium 134 (*) 135 - 145 mEq/L   Potassium 4.1  3.5 - 5.1 mEq/L   Chloride 99  96 - 112 mEq/L   CO2 21  19 - 32 mEq/L   Glucose, Bld 75  70 - 99 mg/dL   BUN 28 (*) 6 - 23 mg/dL   Creatinine, Ser 1.61  0.50 - 1.35 mg/dL   Calcium 09.6  8.4 - 04.5 mg/dL   Total Protein 7.6  6.0 - 8.3 g/dL   Albumin 3.9  3.5 - 5.2 g/dL   AST 21  0 - 37 U/L   ALT 11  0 - 53 U/L   Alkaline Phosphatase 59  39 - 117 U/L   Total Bilirubin 0.6  0.3 - 1.2 mg/dL   GFR calc non Af Amer 58 (*) >90 mL/min   GFR calc Af Amer 67 (*) >90 mL/min   Comment: (NOTE)     The eGFR has been calculated using the CKD EPI equation.     This calculation has not been validated in all clinical situations.     eGFR's persistently <90 mL/min signify possible Chronic Kidney     Disease.  TROPONIN I     Status: None   Collection Time    04/28/13  6:45 PM      Result Value Range   Troponin I <0.30  <0.30 ng/mL   Comment:            Due to the release kinetics of cTnI,     a negative result within the first hours     of the onset of symptoms does not rule out     myocardial infarction with certainty.     If myocardial infarction is still suspected,     repeat the test at appropriate intervals.  GLUCOSE,  CAPILLARY     Status: Abnormal   Collection Time    04/28/13  6:58 PM      Result Value Range   Glucose-Capillary 69 (*) 70 - 99 mg/dL  POCT I-STAT TROPONIN I     Status: None   Collection Time    04/28/13  6:58 PM      Result Value Range   Troponin i, poc 0.03  0.00 - 0.08 ng/mL   Comment 3            Comment: Due to the release kinetics of cTnI,     a negative result within the first hours     of the onset of symptoms does not rule out     myocardial infarction with certainty.     If myocardial infarction is still suspected,     repeat the test at appropriate intervals.  POCT I-STAT, CHEM 8     Status: Abnormal   Collection Time    04/28/13  7:00 PM      Result Value Range   Sodium 138  135 - 145 mEq/L   Potassium 4.2  3.5 - 5.1 mEq/L   Chloride 105  96 - 112 mEq/L   BUN 28 (*) 6 - 23 mg/dL   Creatinine, Ser 4.09  0.50 - 1.35 mg/dL   Glucose, Bld 76  70 - 99 mg/dL   Calcium, Ion 8.11  9.14 - 1.30  mmol/L   TCO2 20  0 - 100 mmol/L   Hemoglobin 10.9 (*) 13.0 - 17.0 g/dL   HCT 45.4 (*) 09.8 - 11.9 %  CK     Status: Abnormal   Collection Time    04/28/13  9:15 PM      Result Value Range   Total CK 297 (*) 7 - 232 U/L  VITAMIN B12     Status: None   Collection Time    04/28/13  9:15 PM      Result Value Range   Vitamin B-12 714  211 - 911 pg/mL   Comment: Performed at [facility]  FOLATE     Status: None   Collection Time    04/28/13  9:15 PM      Result Value Range   Folate >20.0     Comment: (NOTE)     Reference Ranges            Deficient:       0.4 - 3.3 ng/mL            Indeterminate:   3.4 - 5.4 ng/mL            Normal:              > 5.4 ng/mL     Performed at [facility]  IRON AND TIBC     Status: Abnormal   Collection Time    04/28/13  9:15 PM      Result Value Range   Iron 20 (*) 42 - 135 ug/dL   TIBC 147  829 - 562 ug/dL   Saturation Ratios 8 (*) 20 - 55 %   UIBC 225  125 - 400 ug/dL   Comment: Performed at [facility]   FERRITIN     Status: None   Collection Time    04/28/13  9:15 PM      Result Value Range   Ferritin 98  22 - 322 ng/mL   Comment: Performed at [facility]  RETICULOCYTES     Status: Abnormal   Collection Time    04/28/13  9:15 PM      Result Value Range   Retic Ct Pct 0.5  0.4 - 3.1 %   RBC. 3.28 (*) 4.22 - 5.81 MIL/uL   Retic Count, Manual 16.4 (*) 19.0 - 186.0 K/uL  PROTIME-INR     Status: None   Collection Time    04/28/13 11:20 PM      Result Value Range   Prothrombin Time 13.9  11.6 - 15.2 seconds   INR 1.09  0.00 - 1.49  URINE RAPID DRUG SCREEN (HOSP PERFORMED)     Status: None   Collection Time    04/29/13 12:18 AM      Result Value Range   Opiates NONE DETECTED  NONE DETECTED   Cocaine NONE DETECTED  NONE DETECTED   Benzodiazepines NONE DETECTED  NONE DETECTED   Amphetamines NONE DETECTED  NONE DETECTED   Tetrahydrocannabinol NONE DETECTED  NONE DETECTED   Barbiturates NONE DETECTED  NONE DETECTED   Comment:            DRUG SCREEN FOR MEDICAL PURPOSES     ONLY.  IF CONFIRMATION IS NEEDED     FOR ANY PURPOSE, NOTIFY LAB     WITHIN 5 DAYS.                LOWEST DETECTABLE LIMITS     FOR URINE  DRUG SCREEN     Drug Class       Cutoff (ng/mL)     Amphetamine      1000     Barbiturate      200     Benzodiazepine   200     Tricyclics       300     Opiates          300     Cocaine          300     THC              50  BASIC METABOLIC PANEL     Status: Abnormal   Collection Time    04/29/13  4:50 AM      Result Value Range   Sodium 135  135 - 145 mEq/L   Potassium 4.0  3.5 - 5.1 mEq/L   Chloride 101  96 - 112 mEq/L   CO2 25  19 - 32 mEq/L   Glucose, Bld 101 (*) 70 - 99 mg/dL   BUN 26 (*) 6 - 23 mg/dL   Creatinine, Ser 6.57  0.50 - 1.35 mg/dL   Calcium 9.1  8.4 - 84.6 mg/dL   GFR calc non Af Amer 58 (*) >90 mL/min   GFR calc Af Amer 67 (*) >90 mL/min   Comment: (NOTE)     The eGFR has been calculated using the CKD EPI equation.     This  calculation has not been validated in all clinical situations.     eGFR's persistently <90 mL/min signify possible Chronic Kidney     Disease.  CBC     Status: Abnormal   Collection Time    04/29/13  4:50 AM      Result Value Range   WBC 6.5  4.0 - 10.5 K/uL   RBC 3.00 (*) 4.22 - 5.81 MIL/uL   Hemoglobin 9.0 (*) 13.0 - 17.0 g/dL   Comment: DELTA CHECK NOTED     REPEATED TO VERIFY     PREVIOUS RESULT ISTAT   HCT 26.9 (*) 39.0 - 52.0 %   MCV 89.7  78.0 - 100.0 fL   MCH 30.0  26.0 - 34.0 pg   MCHC 33.5  30.0 - 36.0 g/dL   RDW 96.2  95.2 - 84.1 %   Platelets 182  150 - 400 K/uL    Dg Pelvis 1-2 Views  04/28/2013   CLINICAL DATA:  Left hip pain post fall  EXAM: PELVIS - 1-2 VIEW  COMPARISON:  12/05/2006  FINDINGS: Left hip is slightly rotated.  Interval right hip replacement.  Osseous demineralization.  SI joints and left hip joint space preserved.  No acute fracture, dislocation, or bone destruction.  Scattered atherosclerotic calcifications.  IMPRESSION: Osseous demineralization.  No definite acute left hip abnormalities.  Interval right hip arthroplasty.   Electronically Signed   By: Ulyses Southward M.D.   On: 04/28/2013 18:10   Dg Femur Left  04/28/2013   CLINICAL DATA:  Larey Seat. Left thigh pain.  EXAM: LEFT FEMUR - 2 VIEW  COMPARISON:  AP pelvis, same date.  FINDINGS: The femoral neck appears slightly shortened. Could not exclude an impacted subcapital fracture. No fracture of the femoral shaft is identified. Moderate degenerative changes at the knee with chondrocalcinosis. Vascular calcifications are noted.  IMPRESSION: Possible impacted subcapital fracture of the left hip. CT may be helpful for further evaluation.   Electronically Signed   By: Luan Pulling.D.  On: 04/28/2013 21:52   Ct Head Wo Contrast  04/28/2013   CLINICAL DATA:  Larey Seat backwards, left leg pain, denies loss of consciousness, history hypertension  EXAM: CT HEAD WITHOUT CONTRAST  TECHNIQUE: Contiguous axial images were  obtained from the base of the skull through the vertex without intravenous contrast.  COMPARISON:  None  FINDINGS: Scattered motion artifacts.  Generalized atrophy. Normal ventricular morphology.  No midline shift or mass effect.  Small vessel chronic ischemic changes of deep cerebral white matter.  Scattered falcine calcification.  No intracranial hemorrhage, mass lesion, or evidence acute infarction.  No extra-axial fluid collections.  Atherosclerotic calcification of internal carotid and vertebral arteries at skullbase.  Bones and sinuses unremarkable within limits of motion.  IMPRESSION: Atrophy with small vessel chronic ischemic changes in deep cerebral white matter.  No acute intracranial abnormalities.   Electronically Signed   By: Ulyses Southward M.D.   On: 04/28/2013 18:15   Ct Hip Left Wo Contrast  04/29/2013   CLINICAL DATA:  Left hip pain secondary to a fall today.  EXAM: CT OF THE LEFT HIP WITHOUT CONTRAST  TECHNIQUE: Multidetector CT imaging was performed according to the standard protocol. Multiplanar CT image reconstructions were also generated.  COMPARISON:  Radiographs dated 04/28/2013  FINDINGS: There is an impacted subcapital fracture of the left femoral neck. The impaction is primarily lateral and posterior. No displacement. No other acute abnormalities. Chondrocalcinosis of the hip.  IMPRESSION: Impacted subcapital fracture of the left femoral neck.   Electronically Signed   By: Geanie Cooley   On: 04/29/2013 13:43    Review of Systems  Constitutional: Negative for fever.  Respiratory: Negative.   Cardiovascular: Negative.   Musculoskeletal: Positive for joint pain and falls.  Neurological: Negative.  Negative for weakness.   Blood pressure 145/69, pulse 87, temperature 98.6 F (37 C), temperature source Oral, resp. rate 22, height 6\' 4"  (1.93 m), weight 58.7 kg (129 lb 6.6 oz), SpO2 100.00%. Physical Exam  Constitutional: He is oriented to person, place, and time. No distress.   Very health appearing elderly male.   HENT:  Head: Normocephalic and atraumatic.  Eyes: Pupils are equal, round, and reactive to light.  Respiratory: Effort normal.  Musculoskeletal:  Positive log roll left hip.  bilat calves nontender. NVI.   Neurological: He is alert and oriented to person, place, and time.  Skin: Skin is warm and dry.  Psychiatric: He has a normal mood and affect. His behavior is normal. Judgment and thought content normal.    Assessment:   Left subcapital hip fracture  Plan:   Advised patient that best treatment option at this time would likely be hemiarthroplasty.  Dr Shon Baton will discuss this with one of the partners.  Patient has done very well with previous right hip hemiarthroplasty and he continues to be extremely active for his age.  Will need medical clearance from Dr Debby Bud.  Surgical procedure briefly discussed with patient.  Recommend bedrest for now.  Will apply 5lbs bucks traction.    OWENS,[name] Bennett M 04/29/2013, 4:24 PM    Agree with above Dr Charlann Boxer to take of care for definitive fx management

## 2013-04-29 NOTE — Progress Notes (Signed)
Patient ID: [name] Bennett, male   DOB: [birth year], 79 y.o.   MRN: [id number]  Subjective [name] Bennett is a 79 year old male with a history of urinary retention, hemorrhagic cystitis, HTN, and a past hip fracture with a recent admission for hemorrhagic cystitis who presents after a fall at home with left leg pain.  Overnight events:  No acute events overnight. Currently, [name] Bennett denies any pain in his left hip at rest, but does admit to some pain in his mid-thigh on rotation of the hip. He continues to deny any associated symptoms including fever, chills/sweats, dizziness, or changes in vision.  [name] Bennett continues to have sanguinous appearing urine draining from his catheter, but denies any symptoms of dysuria or flank pain. [name] Bennett only medical complaint this morning is constipation. He reports taking sodium citrate regularly at home for constipation.   Results include:  -- Pelvic XR 04/28/13:  Impression included possible impacted subcapital fracture of the left hip.  -- Hemoglobin fall of 1.9 overnight.   Objective Filed Vitals:   04/29/13 0541  BP: 140/66  Pulse: 82  Temp: 98.3 F (36.8 C)  Resp: 20   Physical Exam:  General: Very thin black male with somewhat disheveled appearance sitting in no acute distress.  Extremities: Pain localized to mid-thigh on left hip external rotation. 1+ dorsalis pedis and posterior tibialis pulses bilaterally. Wearing lower extremity intermittent compressive devices.  CV: Regular rate and rhythm, no murmurs or rubs.  Pulm: Lungs clear to auscultation bilaterally.  Abdominal: Normal active bowel sounds. No tenderness to palpation in all 4 quadrants. No masses or HSM palpated.  Urologic: Sanguinous appearing fluid draining from catheter into bag.  Mental Status: On 3 word recall, could recall 2 words. On clock draw to show the time 11:15, [name] Bennett drew a number 15 on the clock face and drew hands pointing to the 11 and 15.   Current  facility-administered medications:0.9 %  sodium chloride infusion, 250 mL, Intravenous, PRN, Tarry Kos, MD;  acetaminophen (TYLENOL) suppository 650 mg, 650 mg, Rectal, Q6H PRN, Tarry Kos, MD;  acetaminophen (TYLENOL) tablet 650 mg, 650 mg, Oral, Q6H PRN, Tarry Kos, MD;  cephALEXin [facility]) capsule 500 mg, 500 mg, Oral, QID, Celene Kras, MD, 500 mg at 04/28/13 2217 sodium chloride 0.9 % injection 3 mL, 3 mL, Intravenous, Q12H, Tarry Kos, MD;  sodium chloride 0.9 % injection 3 mL, 3 mL, Intravenous, PRN, Tarry Kos, MD  Labs:  UA: 04/28/13  shows red urine with cloudy appearance. Notable for positive nitrite, small leukocytes, >300 protein, 7-10 WBC php, RBCs too numerous to count, rare bacteria, and granular casts. Negative for glucose, bilirubin, and ketones.   CBC: --04/29/13: CBC shows decreased Hb at 9.0 and HCT at 26.9. No leukocytosis or thrombocytopenia.  --04/28/13: Last Hb and HCT show Hb 10.9 and HCT 32.0.   Iron studies:  -- 04/28/13: Low serum iron with normal ferritin, normal TIBC, normal folate.   PT/INR:  --07/29/12: PT 13.9, INR 1.09   CMP     Component Value Date/Time   NA 135 04/29/2013 0450   K 4.0 04/29/2013 0450   CL 101 04/29/2013 0450   CO2 25 04/29/2013 0450   GLUCOSE 101* 04/29/2013 0450   BUN 26* 04/29/2013 0450   CREATININE 1.07 04/29/2013 0450   CALCIUM 9.1 04/29/2013 0450   PROT 7.6 04/28/2013 1845   ALBUMIN 3.9 04/28/2013 1845   AST 21 04/28/2013 1845   ALT 11 04/28/2013 1845  ALKPHOS 59 04/28/2013 1845   BILITOT 0.6 04/28/2013 1845   GFRNONAA 58* 04/29/2013 0450   GFRAA 67* 04/29/2013 0450    Assesment & Plan  [name] Bennett is a 79 year old male with a history of urinary retention, hemorrhagic cystitis, HTN, and a past hip fracture with a recent admission for hemorrhagic cystitis who presents after a fall at home with left leg pain. His presentation is significant for gross hematuria, recent unintentional weight loss, and left leg pain following a fall.    1. Left leg pain: Differential includes musculoskeletal trauma/sprain vs traumatic fracture vs. Pathologic fracture. The pelvic XR suggests possible subcapital fracture. Given the history of leg giving out prior to fall and the recent weight loss concerning for malnutrition, it's possible that the fracture evidenced on xray was a precipitating factor for the fall and suggests osteoporotic changes. There is no XR evidence of other causes of pathologic fracture such as an underlying bone tumor. In order to differentiate between a no fracture with just soft tissue changes causing left leg pain and pathologic fracture and a traumatic fracture, a CT may better shows changes in the bone. The mid-thigh pain on left hip rotation suggests referred neuropathic pain over direct inflammatory pain.  -- CT of pelvis  2. Gross Hematuria: Differential includes hemorrhagic cystitis vs. Uroepithelial malignancy. Previous work up by urology demonstrated infectious etiology, and UA suggests bacterial infection currently. [name] Bennett does have risk factors for UTI given that he self-catheterizes at home for urinary retention.  In the setting of recent unintentional weight loss, uroepithelial malignancy may be possible. The unintentional weight loss is complicated by mental status changes that could also be an etiology. Current hemoglobin of 9.0 has fallen since most recent hemoglobin of 10.9 on admission.  --Urology consult  --Consider type and screen in case of need for blood transfusion  3. Cachexia, Mental Status Changes: Differential includes malnutrition secondary to dementia, malignancy, and infectious processes. [name] Bennett reports a normal appetite and caring for himself. In the setting of an abnormal mental status exam and with evidence of some memory loss during the interview, collateral may need to be obtained from his family to determine the extent of possible mental status changes and dementia. The gross hematuria  could suggest a uroepithelial carcinoma, especially in the setting of significant unintentional weight loss.  --Contact with family regarding safety at home and dispo.  --Urology consult regarding hematuria and possible uroepithelial malignancy.  4. Constipation: [name] Bennett does complain of being bothered by constipation, and reports taking sodium citrate regularly at home.  -- Polyethylene glycol 17 gram daily during admission   Patient interviewed and examined. Records reviewed. Agree with the assessment and plan above. I have personally called consults from urology and orthopedics. Pt's CT confirms subcapital impacted femoral neck fracture.

## 2013-04-29 NOTE — Care Management Note (Addendum)
    Page 1 of 1   05/03/2013     5:12:12 PM   CARE MANAGEMENT NOTE 05/03/2013  Patient:  WILLAIM, MODE   Account Number:  [id number]  Date Initiated:  04/29/2013  Documentation initiated by:  [facility]  Subjective/Objective Assessment:   79 Y/O M ADMITTED W/FALL.     Action/Plan:   FROM HOME ALONE.HAS PCP,PHARMACY.HAS RW.   Anticipated DC Date:  05/03/2013   Anticipated DC Plan:  SKILLED NURSING FACILITY      DC Planning Services  CM consult      Choice offered to / List presented to:  C-1 Patient           Status of service:  Completed, signed off Medicare Important Message given?   (If response is "NO", the following Medicare IM given date fields will be blank) Date Medicare IM given:   Date Additional Medicare IM given:    Discharge Disposition:  SKILLED NURSING FACILITY  Per UR Regulation:  Reviewed for med. necessity/level of care/duration of stay  If discussed at Long Length of Stay Meetings, dates discussed:    Comments:  05/03/13 Jemari Hallum RN,BSN NCM [phone] D/C SNF.  04-30-13 Lorenda Ishihara RN CM [id number] Patient with femur fx, plan for surgery 10/4, ortho following.  04/29/13 Atreus Hasz RN,BSN NCM [phone] SPOKE TO PATIENT ABOUT D/C PLANS.HE SAYS HIS TOILET FLUSHES BUT NEEDS SOME PLUMBING.HE DOES NOT HAVE A REFRIGERATOR,USES AN ICE COOLER-STATES IT WORKS JUST FINE.HE SAYS HE IS ON A FIXED INCOME & UNABLE TO PAY FOR THESE UPGRADES.BUT SINCE EVERYTHING IS FUNCTIONAL HE IS OK WITH WHAT HE HAS.WOULD RECOMMEND [facility] SOCIAL WORKER @ D/C.PROVIDED W/HHC AGENCY LIST, & PRIVATE SITTER LIST.WILL ALSO RECOMMEND HHRN.AWAIT PT RECOMMENDATIONS.

## 2013-04-29 NOTE — Progress Notes (Signed)
Patient ID: [name] Bennett, male   DOB: 29-Nov-1917, 79 y.o.   MRN: [id number]   I just spoke with Dr [name].  He states that patient is stable from a medical/cardiac/pulm standpoint to proceed with left hip surgery.  I will relay to Dr Charlann Boxer and Shon Baton.  He is NPO.

## 2013-04-29 NOTE — Consult Note (Signed)
Urology Consult   Physician requesting consult: Onalee Hua  Reason for consult: Gross hematuria   History of Present Illness: [name] Bennett is a 79 y.o. male with PMH significant for hypocontractile bladder (performs CICs), HTN, and recurrent UTIs who presented to the ED on 04/27/13 with c/o gross hematuria and an inability to cath himself.  He was diagnosed with a UTI and started on Keflex.  A 3-way cath was placed and he was irrigated with clots removed.  Foley was left in place and he was instructed to f/u with urology 2 days later.  The following day the pt fell and had left leg pain which prompted a return to the ED with admission.  He has had persistent GH but denies abdominal pain, back pain, fevers, and chills.    He was last evaled in the office by Dr. Sherron Monday in July 2014 at which time he had a CT A/P with/without contrast performed due to recurrent/intermittent gross hematuria.  This revealed no gross abnormalities to the renal collecting system or ureters and multiple bilateral renal cysts, however, the study was limited due to artifact from the pts right hip.  He had a UTI at the time caused by Providencia Rettgeri and E coli both with mixed sensitivities/resistance.  Pt has not had a cysto performed.    He is currently resting comfortably and denies HA, confusion, SOB, CP, N/V, abdominal pain, and back pain.  He states he has a good appetite an is eating well.  His only complaint is of left leg discomfort.  CT of the pelvis and left extremity is pending.    Past Medical History  Diagnosis Date  . Hypertension   . Urinary retention     Past Surgical History  Procedure Laterality Date  . Fracture surgery    . Hip surgery      Current Hospital Medications:  Home Meds:    Medication List    ASK your doctor about these medications       aspirin EC 81 MG tablet  Take 81 mg by mouth daily.     cephALEXin 500 MG capsule  Commonly known as:  KEFLEX  Take 1 capsule (500 mg  total) by mouth 4 (four) times daily.     furosemide 40 MG tablet  Commonly known as:  LASIX  Take 1 tablet (40 mg total) by mouth daily.     losartan 50 MG tablet  Commonly known as:  COZAAR  Take 1 tablet (50 mg total) by mouth daily.        Scheduled Meds: . cephALEXin  500 mg Oral QID  . polyethylene glycol  17 g Oral Daily  . sodium chloride  3 mL Intravenous Q12H   Continuous Infusions:  PRN Meds:.sodium chloride, acetaminophen, acetaminophen, sodium chloride  Allergies: No Known Allergies  History reviewed. No pertinent family history.  Social History:  reports that he has never smoked. He does not have any smokeless tobacco history on file. He reports that he does not drink alcohol or use illicit drugs.  ROS: A complete review of systems was performed.  All systems are negative except for pertinent findings as noted.  Physical Exam:  Vital signs in last 24 hours: Temp:  [97.8 F (36.6 C)-98.9 F (37.2 C)] 98.3 F (36.8 C) (10/02 0541) Pulse Rate:  [73-114] 82 (10/02 0541) Resp:  [19-24] 20 (10/02 0541) BP: (140-186)/(65-88) 140/66 mmHg (10/02 0541) SpO2:  [96 %-100 %] 100 % (10/02 0541) Weight:  [58.7 kg (129  lb 6.6 oz)] 58.7 kg (129 lb 6.6 oz) (10/01 2228) Constitutional:  Alert and oriented, No acute distress; thin Cardiovascular: Regular rate and rhythm Respiratory: Normal respiratory effort GI: Abdomen is soft, nontender, nondistended, no abdominal masses GU: No CVA tenderness; uncirc penis with no discharge; foley cath in place with dark red hematuria in tubing and bag Lymphatic: No lymphadenopathy Neurologic: Grossly intact, no focal deficits Psychiatric: Normal mood and affect  Laboratory Data:   Recent Labs  04/28/13 1845 04/28/13 1900 04/29/13 0450  WBC 8.7  --  6.5  HGB 10.0* 10.9* 9.0*  HCT 30.7* 32.0* 26.9*  PLT 185  --  182     Recent Labs  04/28/13 1845 04/28/13 1900 04/29/13 0450  NA 134* 138 135  K 4.1 4.2 4.0  CL 99 105  101  GLUCOSE 75 76 101*  BUN 28* 28* 26*  CALCIUM 10.4  --  9.1  CREATININE 1.07 1.30 1.07     Results for orders placed during the hospital encounter of 04/28/13 (from the past 24 hour(s))  ETHANOL     Status: None   Collection Time    04/28/13  6:45 PM      Result Value Range   Alcohol, Ethyl (B) <11  0 - 11 mg/dL  PROTIME-INR     Status: None   Collection Time    04/28/13  6:45 PM      Result Value Range   Prothrombin Time 13.9  11.6 - 15.2 seconds   INR 1.09  0.00 - 1.49  APTT     Status: None   Collection Time    04/28/13  6:45 PM      Result Value Range   aPTT 26  24 - 37 seconds  CBC     Status: Abnormal   Collection Time    04/28/13  6:45 PM      Result Value Range   WBC 8.7  4.0 - 10.5 K/uL   RBC 3.41 (*) 4.22 - 5.81 MIL/uL   Hemoglobin 10.0 (*) 13.0 - 17.0 g/dL   HCT 09.8 (*) 11.9 - 14.7 %   MCV 90.0  78.0 - 100.0 fL   MCH 29.3  26.0 - 34.0 pg   MCHC 32.6  30.0 - 36.0 g/dL   RDW 82.9  56.2 - 13.0 %   Platelets 185  150 - 400 K/uL  DIFFERENTIAL     Status: Abnormal   Collection Time    04/28/13  6:45 PM      Result Value Range   Neutrophils Relative % 90 (*) 43 - 77 %   Neutro Abs 7.8 (*) 1.7 - 7.7 K/uL   Lymphocytes Relative 5 (*) 12 - 46 %   Lymphs Abs 0.5 (*) 0.7 - 4.0 K/uL   Monocytes Relative 5  3 - 12 %   Monocytes Absolute 0.4  0.1 - 1.0 K/uL   Eosinophils Relative 0  0 - 5 %   Eosinophils Absolute 0.0  0.0 - 0.7 K/uL   Basophils Relative 0  0 - 1 %   Basophils Absolute 0.0  0.0 - 0.1 K/uL  COMPREHENSIVE METABOLIC PANEL     Status: Abnormal   Collection Time    04/28/13  6:45 PM      Result Value Range   Sodium 134 (*) 135 - 145 mEq/L   Potassium 4.1  3.5 - 5.1 mEq/L   Chloride 99  96 - 112 mEq/L   CO2 21  19 - 32  mEq/L   Glucose, Bld 75  70 - 99 mg/dL   BUN 28 (*) 6 - 23 mg/dL   Creatinine, Ser 4.54  0.50 - 1.35 mg/dL   Calcium 09.8  8.4 - 11.9 mg/dL   Total Protein 7.6  6.0 - 8.3 g/dL   Albumin 3.9  3.5 - 5.2 g/dL   AST 21  0 - 37  U/L   ALT 11  0 - 53 U/L   Alkaline Phosphatase 59  39 - 117 U/L   Total Bilirubin 0.6  0.3 - 1.2 mg/dL   GFR calc non Af Amer 58 (*) >90 mL/min   GFR calc Af Amer 67 (*) >90 mL/min  TROPONIN I     Status: None   Collection Time    04/28/13  6:45 PM      Result Value Range   Troponin I <0.30  <0.30 ng/mL  GLUCOSE, CAPILLARY     Status: Abnormal   Collection Time    04/28/13  6:58 PM      Result Value Range   Glucose-Capillary 69 (*) 70 - 99 mg/dL  POCT I-STAT TROPONIN I     Status: None   Collection Time    04/28/13  6:58 PM      Result Value Range   Troponin i, poc 0.03  0.00 - 0.08 ng/mL   Comment 3           POCT I-STAT, CHEM 8     Status: Abnormal   Collection Time    04/28/13  7:00 PM      Result Value Range   Sodium 138  135 - 145 mEq/L   Potassium 4.2  3.5 - 5.1 mEq/L   Chloride 105  96 - 112 mEq/L   BUN 28 (*) 6 - 23 mg/dL   Creatinine, Ser 1.47  0.50 - 1.35 mg/dL   Glucose, Bld 76  70 - 99 mg/dL   Calcium, Ion 8.29  5.62 - 1.30 mmol/L   TCO2 20  0 - 100 mmol/L   Hemoglobin 10.9 (*) 13.0 - 17.0 g/dL   HCT 13.0 (*) 86.5 - 78.4 %  CK     Status: Abnormal   Collection Time    04/28/13  9:15 PM      Result Value Range   Total CK 297 (*) 7 - 232 U/L  VITAMIN B12     Status: None   Collection Time    04/28/13  9:15 PM      Result Value Range   Vitamin B-12 714  211 - 911 pg/mL  FOLATE     Status: None   Collection Time    04/28/13  9:15 PM      Result Value Range   Folate >20.0    IRON AND TIBC     Status: Abnormal   Collection Time    04/28/13  9:15 PM      Result Value Range   Iron 20 (*) 42 - 135 ug/dL   TIBC 696  295 - 284 ug/dL   Saturation Ratios 8 (*) 20 - 55 %   UIBC 225  125 - 400 ug/dL  FERRITIN     Status: None   Collection Time    04/28/13  9:15 PM      Result Value Range   Ferritin 98  22 - 322 ng/mL  RETICULOCYTES     Status: Abnormal   Collection Time    04/28/13  9:15 PM  Result Value Range   Retic Ct Pct 0.5  0.4 - 3.1 %    RBC. 3.28 (*) 4.22 - 5.81 MIL/uL   Retic Count, Manual 16.4 (*) 19.0 - 186.0 K/uL  PROTIME-INR     Status: None   Collection Time    04/28/13 11:20 PM      Result Value Range   Prothrombin Time 13.9  11.6 - 15.2 seconds   INR 1.09  0.00 - 1.49  URINE RAPID DRUG SCREEN (HOSP PERFORMED)     Status: None   Collection Time    04/29/13 12:18 AM      Result Value Range   Opiates NONE DETECTED  NONE DETECTED   Cocaine NONE DETECTED  NONE DETECTED   Benzodiazepines NONE DETECTED  NONE DETECTED   Amphetamines NONE DETECTED  NONE DETECTED   Tetrahydrocannabinol NONE DETECTED  NONE DETECTED   Barbiturates NONE DETECTED  NONE DETECTED  BASIC METABOLIC PANEL     Status: Abnormal   Collection Time    04/29/13  4:50 AM      Result Value Range   Sodium 135  135 - 145 mEq/L   Potassium 4.0  3.5 - 5.1 mEq/L   Chloride 101  96 - 112 mEq/L   CO2 25  19 - 32 mEq/L   Glucose, Bld 101 (*) 70 - 99 mg/dL   BUN 26 (*) 6 - 23 mg/dL   Creatinine, Ser 1.30  0.50 - 1.35 mg/dL   Calcium 9.1  8.4 - 86.5 mg/dL   GFR calc non Af Amer 58 (*) >90 mL/min   GFR calc Af Amer 67 (*) >90 mL/min  CBC     Status: Abnormal   Collection Time    04/29/13  4:50 AM      Result Value Range   WBC 6.5  4.0 - 10.5 K/uL   RBC 3.00 (*) 4.22 - 5.81 MIL/uL   Hemoglobin 9.0 (*) 13.0 - 17.0 g/dL   HCT 78.4 (*) 69.6 - 29.5 %   MCV 89.7  78.0 - 100.0 fL   MCH 30.0  26.0 - 34.0 pg   MCHC 33.5  30.0 - 36.0 g/dL   RDW 28.4  13.2 - 44.0 %   Platelets 182  150 - 400 K/uL   Recent Results (from the past 240 hour(s))  URINE CULTURE     Status: None   Collection Time    04/28/13 12:39 AM      Result Value Range Status   Specimen Description URINE, CATHETERIZED   Final   Special Requests CX ADDED AT 0215 ON 102725   Final   Culture  Setup Time     Final   Value: 04/28/2013 02:18     Performed at [facility]   Colony Count PENDING   Incomplete   Culture     Final   Value: Culture reincubated for better growth      Performed at [facility]   Report Status PENDING   Incomplete    Renal Function:  Recent Labs  04/28/13 1845 04/28/13 1900 04/29/13 0450  CREATININE 1.07 1.30 1.07   Estimated Creatinine Clearance: 35.8 ml/min (by C-G formula based on Cr of 1.07).  Radiologic Imaging: Dg Pelvis 1-2 Views  04/28/2013   CLINICAL DATA:  Left hip pain post fall  EXAM: PELVIS - 1-2 VIEW  COMPARISON:  12/05/2006  FINDINGS: Left hip is slightly rotated.  Interval right hip replacement.  Osseous demineralization.  SI joints and left  hip joint space preserved.  No acute fracture, dislocation, or bone destruction.  Scattered atherosclerotic calcifications.  IMPRESSION: Osseous demineralization.  No definite acute left hip abnormalities.  Interval right hip arthroplasty.   Electronically Signed   By: Ulyses Southward M.D.   On: 04/28/2013 18:10   Dg Femur Left  04/28/2013   CLINICAL DATA:  Larey Seat. Left thigh pain.  EXAM: LEFT FEMUR - 2 VIEW  COMPARISON:  AP pelvis, same date.  FINDINGS: The femoral neck appears slightly shortened. Could not exclude an impacted subcapital fracture. No fracture of the femoral shaft is identified. Moderate degenerative changes at the knee with chondrocalcinosis. Vascular calcifications are noted.  IMPRESSION: Possible impacted subcapital fracture of the left hip. CT may be helpful for further evaluation.   Electronically Signed   By: Loralie Champagne M.D.   On: 04/28/2013 21:52   Ct Head Wo Contrast  04/28/2013   CLINICAL DATA:  Larey Seat backwards, left leg pain, denies loss of consciousness, history hypertension  EXAM: CT HEAD WITHOUT CONTRAST  TECHNIQUE: Contiguous axial images were obtained from the base of the skull through the vertex without intravenous contrast.  COMPARISON:  None  FINDINGS: Scattered motion artifacts.  Generalized atrophy. Normal ventricular morphology.  No midline shift or mass effect.  Small vessel chronic ischemic changes of deep cerebral white matter.  Scattered  falcine calcification.  No intracranial hemorrhage, mass lesion, or evidence acute infarction.  No extra-axial fluid collections.  Atherosclerotic calcification of internal carotid and vertebral arteries at skullbase.  Bones and sinuses unremarkable within limits of motion.  IMPRESSION: Atrophy with small vessel chronic ischemic changes in deep cerebral white matter.  No acute intracranial abnormalities.   Electronically Signed   By: Ulyses Southward M.D.   On: 04/28/2013 18:15    Impression/Recommendation  Gross hematuria - may be bleeding from BPH or hemorrhagic cystitis from UTI and retention.  Have instructed RN to irrigate foley now and prn to ensure clots are being cleared from bladder.  Continue Keflex until culture results available for review and then adjust if necessary.  Pt will  need cysto if bleeding does not resolve preferably after UTI treated.     Silas Flood 04/29/2013, 12:34 PM

## 2013-04-29 NOTE — Progress Notes (Signed)
Foley irrigated with no return of clots. Pt tolerated well. Urine still grossly bloody. Will continue to monitor.  Julio Sicks RN

## 2013-04-29 NOTE — Progress Notes (Addendum)
PT Cancellation Note  Patient Details Name: [name] Bennett MRN: [id number] DOB: Mar 16, 1918   Cancelled Treatment:    Reason Eval/Treat Not Completed: Medical issues which prohibited therapy (CT reveals subcapital femoral neck fx on L ) Will await any further work up of DX . Will need MD clearance before mobilizing related to weight bearing.   Rada Hay 04/29/2013, 2:34 PM Blanchard Kelch PT [phone]

## 2013-04-29 NOTE — Progress Notes (Signed)
Clinical Social Work Department BRIEF PSYCHOSOCIAL ASSESSMENT 04/29/2013  Patient:  [name] Bennett, [name] Bennett     Account Number:  [id number]     Admit date:  04/28/2013  Clinical Social Worker:  Jacelyn Grip  Date/Time:  04/29/2013 11:30 AM  Referred by:  Care Management  Date Referred:  04/29/2013 Referred for  SNF Placement   Other Referral:   Interview type:  Patient Other interview type:    PSYCHOSOCIAL DATA Living Status:  ALONE Admitted from facility:   Level of care:   Primary support name:  Benin Guy/niece Primary support relationship to patient:  FAMILY Degree of support available:   unknown at this time; no family present at bedside    CURRENT CONCERNS Current Concerns  Post-Acute Placement   Other Concerns:    SOCIAL WORK ASSESSMENT / PLAN CSW received referral that pt admitted from home alone after a fall at home. Pt currently Observation status. PT/OT evaluations are pending, but CSW met with pt at bedside to discuss disposition planning.    CSW introduced self and explained role. Pt ornery and stated he already answered all of the questions about his home environment. CSW expressed understanding and inquired with pt that if PT/OT recommended SNF placement if pt would be agreeable to SNF. Pt asked if SNF placement would require a financial obligation. CSW explained that pt insurance does have copayments for SNF.    Pt stated that he would not be able to manage copayments therefore SNF would not be an option. Pt stated that he was tired at this time and wanted to rest. CSW respected pt request.    CSW was unable to assess further pt home situation and reported issues of difficulty with plumbing.    CSW contacted RNCM to notify of pt refusal of SNF at this time due to inability to afford copayments. RNCM reports that she was able to meet with pt and pt discussed the plumbing and refrigerator difficulties at home, but pt had a plan for repairs of plumbing and is  working with Rhetta Mura re: refrigerator. Pt is currently using and icebox for refrigeration which has been working with pt. Per RNCM, RNCM setting up [facility] services and CSW recommended including HH SW with [facility] services.    No further social work needs identified at this time.    Please re-consult if social work needs arise.    CSW signing off.   Assessment/plan status:  No Further Intervention Required Other assessment/ plan:   Information/referral to community resources:   RNCM following for Home Health needs; pt refusing SNF    PATIENT'S/FAMILY'S RESPONSE TO PLAN OF CARE: Pt alert and oriented x 4. Pt not actively engaged in assessment, but reports speaking extensively with RNCM. Pt not agreeable to SNF as pt states he would be unable to afford copayments.  CSW signing off. Please re-consult if further social work needs arise.    Jacklynn Lewis, MSW, LCSWA  Clinical Social Work Coverage for [employer]

## 2013-04-30 DIAGNOSIS — D649 Anemia, unspecified: Secondary | ICD-10-CM

## 2013-04-30 DIAGNOSIS — M79609 Pain in unspecified limb: Secondary | ICD-10-CM

## 2013-04-30 DIAGNOSIS — N390 Urinary tract infection, site not specified: Secondary | ICD-10-CM

## 2013-04-30 DIAGNOSIS — S72009A Fracture of unspecified part of neck of unspecified femur, initial encounter for closed fracture: Secondary | ICD-10-CM

## 2013-04-30 DIAGNOSIS — E43 Unspecified severe protein-calorie malnutrition: Secondary | ICD-10-CM | POA: Insufficient documentation

## 2013-04-30 DIAGNOSIS — I10 Essential (primary) hypertension: Secondary | ICD-10-CM

## 2013-04-30 DIAGNOSIS — K5900 Constipation, unspecified: Secondary | ICD-10-CM

## 2013-04-30 LAB — SURGICAL PCR SCREEN: Staphylococcus aureus: NEGATIVE

## 2013-04-30 MED ORDER — SULFAMETHOXAZOLE-TMP DS 800-160 MG PO TABS
1.0000 | ORAL_TABLET | Freq: Two times a day (BID) | ORAL | Status: DC
  Administered 2013-04-30 – 2013-05-03 (×7): 1 via ORAL
  Filled 2013-04-30 (×8): qty 1

## 2013-04-30 MED ORDER — ENSURE COMPLETE PO LIQD
237.0000 mL | Freq: Three times a day (TID) | ORAL | Status: DC
  Administered 2013-04-30 – 2013-05-03 (×7): 237 mL via ORAL

## 2013-04-30 MED ORDER — MAGNESIUM CITRATE PO SOLN
1.0000 | Freq: Every day | ORAL | Status: DC | PRN
  Administered 2013-04-30 – 2013-05-03 (×2): 1 via ORAL

## 2013-04-30 NOTE — Progress Notes (Signed)
[name] Bennett ID: [name] Bennett, male   DOB: 24-Jun-1918, 79 y.o.   MRN: [id number]  Subjective:  [name] Bennett is a 79 year old male with a history of urinary retention, hemorrhagic cystitis, HTN, and a past hip fracture with a recent admission for hemorrhagic cystitis who presents after a fall at home with left leg pain with XR and CT evidence of a subcapital impaction fracture.   Overnight events:  No acute events overnight. [name] Bennett reports that he is doing well this morning, and his only complaint is constipation. He reports that he takes magnesium citrate daily at home. He is in agreement with the plan for surgical management of his hip. He says he will consider going to a SNF after discharge.   Consults:  -- Orthopedics: Venita Lick saw the [name] Bennett last evening and advised the [name] Bennett that the best treatment option is hemiarthroplasty.  -- Urology: Dr. Sherron Monday saw the [name] Bennett yesterday evening. Per Dr. Sherron Monday, the most likely etiology of the hematuria is BPH or hemorrhagic cystitis secondary to UTI and urinary retention. Ordered irrigation of foley PRN to ensure clots are cleared and to continue keflex pending urine culture to treat possible UTI. Advises that cytoscopy may be next step if hematuria continues after UTI is adequately treated.  -- Social Work: Social Work discussed need for SNF with [name] Bennett, who refused SNF placement due to financial concerns. Reports that [name] Bennett does have plumbing and refrigerator problems at home, but [name] Bennett has adeuqate plan for repairs. Advises home health and home social work services if discharge to home.   Objective:  Filed Vitals:   04/30/13 0656  BP: 140/70  Pulse: 74  Temp: 98.4 F (36.9 C)  Resp: 18   Physical Exam:  General: Cachetic black male lying in bed in no acute distress. Bilateral intermittent compression devices on lower extremities.  CV: Regular rate and rhythm, no murmurs or rubs.  Pulm: Lungs clear to auscultation bilaterally  with no wheezes or crackles.   Labs:   Urine Culture from 04/28/13: grows >100,000 colonies of gram negative rods identified as E. Coli.   MRSA PCR 04/30/13: Negative   Imaging: IMPRESSION: Impacted subcapital fracture of the left femoral neck. Electronically Signed By: Geanie Cooley    CT 04/29/13:   Current facility-administered medications:0.9 %  sodium chloride infusion, , Intravenous, Continuous, Zonia Kief, PA-C, Last Rate: 100 mL/hr at 04/30/13 0341;  acetaminophen (TYLENOL) suppository 650 mg, 650 mg, Rectal, Q6H PRN, Tarry Kos, MD;  acetaminophen (TYLENOL) tablet 650 mg, 650 mg, Oral, Q6H PRN, Tarry Kos, MD;  bisacodyl (DULCOLAX) suppository 10 mg, 10 mg, Rectal, Daily PRN, Zonia Kief, PA-C cephALEXin [facility]) capsule 500 mg, 500 mg, Oral, QID, Celene Kras, MD, 500 mg at 04/29/13 2159;  magnesium citrate solution 1 Bottle, 1 Bottle, Oral, Daily PRN, Genelle Gather Babish, PA-C;  polyethylene glycol (MIRALAX / GLYCOLAX) packet 17 g, 17 g, Oral, Daily, Jacques Navy, MD, 17 g at 04/29/13 1252  Assessment & Plan:  [name] Bennett is a 79 year old male with a history of urinary retention, hemorrhagic cystitis, HTN, and a past hip fracture with a recent admission for hemorrhagic cystitis who presents after a fall at home with left leg pain with XR and CT evidence of a subcapital impaction fracture.   1. Impacted subcapital fracture of left hip. Given the XR and CT evidence, this is the most likely etiology of his left hip pain and inability to adequately bear weight on his left leg. A  pathologic fracture is possible given his cachectic appearance suggestive of possible malnutrition or malignancy.  -- Hemiarthroplasty scheduled for 05/01/13   2. Gross Hematuria. Urology is following [name] Bennett and advises that gross hematuria is likely the result of BPH or UTI. The urine culture growing >100,000 E. Coli and his history of past urinary retention and self-catheterizations are suggest that UTI is  the likely etiology. Given his cachetic appearance and gross hematuria, uroepithelial malignancy is possible. If hematuria resolves with UTI, then UTI is the most likely etiology; however, if bleeding persists, will consider cystoscopy to rule out bladder carcinoma. The gross hematuria suggests if malignancy is the source of the bleeding that lower urinary tract is the more likely location than the kidneys.  -- Given culture results of E. Coli and first generation cephalosporins inadequate activity against gram negative bacteria, D/c cephalexin and start  Septra DS x 7 days as an oral agent with adequate coverage for E. Coli.  -- Continue follow-up care with Dr. Sherron Monday with Urology to consider need for cystoscopy   3. Constipation: [name] Bennett reports constipation today that was not alleviated by polyethylene glycol, 17 mg yesterday. Reports taking magnesium citrate at home daily for constipation.  -- Magnesium citrate 150 mL daily, divided BID.   4. Mental Status Changes /Dementia: His advanced age and the history of  abnormal mental status exam findings suggest likely Alzheimer's dementia. [name] Bennett is still oriented x3 and has been caring for himself at home. His cachetic appearance may be due to malnutrition secondary to inability to adequately care for himself.   -- Suggest home health and home social work care after discharge.   5. Safety/Dispo: [name] Bennett has a concerning presentation for his unintentional weight loss and mental status changes suggesting Alzheimer's Dementia. Malignancy has not yet been ruled out as an etiology of his cachexia; however, it could also be secondary to malnutrition. [name] Bennett reports strong family support and has been able to follow up with his urology appointments, suggesting some ability to manage his affairs at home; however, he also reports having trouble managing home finances and establishing working plumbing and refrigeration at home. [name] Bennett has spoken with  social work regarding SNF placement and has declined.  -- Consider short term SNF placement after discharge for help with post-surgical care and PT.  -- Consider home health and home social work care after discharge to home.   [name] Bennett seen and examined. Agree with detailed assessment and plan as above. Have STRONGLY encourgedMr. Filyaw to consider ST-SNF and will continue this discussion.

## 2013-04-30 NOTE — Progress Notes (Signed)
Foley irrigated at 0230 with no return on clots. Urine is pink color. Will continue to monitor.

## 2013-04-30 NOTE — Progress Notes (Signed)
INITIAL NUTRITION ASSESSMENT  Pt meets criteria for severe MALNUTRITION in the context of chronic illness as evidenced by <75% estimated energy intake in the past month with visible severe muscle wasting and subcutaneous fat loss in clavicles.  DOCUMENTATION CODES Per approved criteria  -Severe malnutrition in the context of chronic illness -Underweight   INTERVENTION: - Ensure Complete TID - Educated pt on high calorie/protein diet therapy to promote weight gain - Will continue to monitor   NUTRITION DIAGNOSIS: Increased nutrient needs related to left hip fracture as evidenced by MD notes.   Goal: Pt to consume 100% of meals and supplements  Monitor:  Weights, labs, intake  Reason for Assessment: Underweight  79 y.o. male  Admitting Dx: Weakness generalized  ASSESSMENT: Pt admitted with fall on front porch, found to have left hip fracture. Pt lives alone and niece is concerned about living situation as pt does not have working plumbing, working toilet, or working Engineer, maintenance (IT). Met with pt who c/o not having a BM in 3 days, getting medication for this. Pt reports he was eating well at home, 2 meals/day, and enjoys drinking cranberry juice. C/o some weakness PTA.  Nutrition Focused Physical Exam:  Subcutaneous Fat:  Orbital Region: mild/moderate wasting Upper Arm Region: mild/moderate wasting Thoracic and Lumbar Region: NA  Muscle:  Temple Region: mild/moderate wasting Clavicle Bone Region: severe wasting Clavicle and Acromion Bone Region: severe wasting Scapular Bone Region: NA Dorsal Hand: mild/moderate wasting Patellar Region: mild/moderate wasting Anterior Thigh Region: NA Posterior Calf Region: NA  Edema: Non-pitting RLE, LLE edema   Height: Ht Readings from Last 1 Encounters:  04/28/13 6\' 4"  (1.93 m)    Weight: Wt Readings from Last 1 Encounters:  04/28/13 129 lb 6.6 oz (58.7 kg)    Ideal Body Weight: 202 lb   % Ideal Body Weight: 64%  Wt  Readings from Last 10 Encounters:  04/28/13 129 lb 6.6 oz (58.7 kg)  09/15/11 141 lb 5 oz (64.1 kg)  09/13/10 148 lb 4 oz (67.246 kg)  07/27/09 145 lb (65.772 kg)  01/22/08 159 lb (72.122 kg)  10/06/06 175 lb (79.379 kg)    Usual Body Weight: Pt unsure however weighed 141 lb in 08/2011  % Usual Body Weight: 91%  BMI:  Body mass index is 15.76 kg/(m^2). Underweight  Estimated Nutritional Needs: Kcal: 2350-2550 Protein: 130-150g Fluid: 2.3-2.5L/day  Skin: Non-pitting RLE, LLE edema  Diet Order: General  EDUCATION NEEDS: -Education needs addressed - discussed high calorie/protein diet to promote weight gain and provided handout of this information   Intake/Output Summary (Last 24 hours) at 04/30/13 1226 Last data filed at 04/30/13 1217  Gross per 24 hour  Intake 1921.67 ml  Output   3820 ml  Net -1898.33 ml    Last BM: 9/29  Labs:   Recent Labs Lab 04/28/13 1845 04/28/13 1900 04/29/13 0450 04/29/13 1736  NA 134* 138 135 134*  K 4.1 4.2 4.0 4.1  CL 99 105 101 102  CO2 21  --  25 23  BUN 28* 28* 26* 21  CREATININE 1.07 1.30 1.07 0.96  CALCIUM 10.4  --  9.1 9.4  GLUCOSE 75 76 101* 86    CBG (last 3)   Recent Labs  04/28/13 1858  GLUCAP 69*    Scheduled Meds: . polyethylene glycol  17 g Oral Daily  . sulfamethoxazole-trimethoprim  1 tablet Oral Q12H    Continuous Infusions: . sodium chloride 100 mL/hr at 04/30/13 0341    Past Medical History  Diagnosis Date  . Hypertension   . Urinary retention     Past Surgical History  Procedure Laterality Date  . Fracture surgery    . Hip surgery      Levon Hedger MS, RD, LDN [phone] Pager [phone] After Hours Pager

## 2013-04-30 NOTE — Progress Notes (Signed)
Mild blood in urine with foley draining well No s/p fullness and tenderness Labs acceptable  Send home with antibiotics and foley  See me next week in clinic and i will d/c catheter i agree/examined pt consult note in full

## 2013-04-30 NOTE — Progress Notes (Signed)
   Subjective: Left hip fracture  Patient reports pain as mild, pain controlled. No events throughout the night. We have discussed and he knows that he will need to have surgery to fix his left hip. Risks, benefits and expectations were discussed with the patient. Patient understand the risks, benefits and expectations and wishes to proceed with surgery.  States that he understands that it will have to wait until tomorrow morning, so he can eat not, but not after midnight. States he hasn't had a bowel movement in a couple days and there are times at home that he uses magnesium citrate to help him have a BM.  Objective:   VITALS:   Filed Vitals:   04/30/13 0656  BP: 140/70  Pulse: 74  Temp: 98.4 F (36.9 C)  Resp: 18    Neurovascular intact Dorsiflexion/Plantar flexion intact No cellulitis present Compartment soft  LABS  Recent Labs  04/28/13 1845 04/28/13 1900 04/29/13 0450  HGB 10.0* 10.9* 9.0*  HCT 30.7* 32.0* 26.9*  WBC 8.7  --  6.5  PLT 185  --  182     Recent Labs  04/28/13 1900 04/29/13 0450 04/29/13 1736  NA 138 135 134*  K 4.2 4.0 4.1  BUN 28* 26* 21  CREATININE 1.30 1.07 0.96  GLUCOSE 76 101* 86     Assessment/Plan: Left hip fracture  Bed rest now Ordered Magnesium Citrate to help him have a BM today, which he would like to use this morning. Given an order for a diet now, but also ordered to be NPO after midnight. Will obtain consent for a left hip hemi arthroplasty. Pt is already scheduled to have surgery tomorrow morning about 7:30.    Anastasio Auerbach Charnese Federici   PAC  04/30/2013, 9:25 AM

## 2013-05-01 ENCOUNTER — Encounter (HOSPITAL_COMMUNITY): Payer: Self-pay | Admitting: Certified Registered Nurse Anesthetist

## 2013-05-01 ENCOUNTER — Encounter (HOSPITAL_COMMUNITY)
Admission: EM | Disposition: A | Discharge status: Skilled Nursing Facility | Payer: Self-pay | Source: Home / Self Care | Attending: Internal Medicine

## 2013-05-01 ENCOUNTER — Inpatient Hospital Stay (HOSPITAL_COMMUNITY): Payer: Medicare Other

## 2013-05-01 ENCOUNTER — Inpatient Hospital Stay (HOSPITAL_COMMUNITY): Payer: Medicare Other | Admitting: Certified Registered Nurse Anesthetist

## 2013-05-01 DIAGNOSIS — R55 Syncope and collapse: Secondary | ICD-10-CM

## 2013-05-01 DIAGNOSIS — R634 Abnormal weight loss: Secondary | ICD-10-CM

## 2013-05-01 DIAGNOSIS — R319 Hematuria, unspecified: Secondary | ICD-10-CM

## 2013-05-01 HISTORY — PX: HIP ARTHROPLASTY: SHX981

## 2013-05-01 LAB — URINE CULTURE: Colony Count: >=100000

## 2013-05-01 LAB — TYPE AND SCREEN
ABO/RH(D): O POS
Antibody Screen: NEGATIVE

## 2013-05-01 SURGERY — HEMIARTHROPLASTY, HIP, DIRECT ANTERIOR APPROACH, FOR FRACTURE
Anesthesia: General | Site: Hip | Laterality: Left | Wound class: Clean

## 2013-05-01 MED ORDER — POLYETHYLENE GLYCOL 3350 17 G PO PACK
17.0000 g | PACK | Freq: Every day | ORAL | Status: DC | PRN
  Administered 2013-05-02: 17 g via ORAL
  Filled 2013-05-01: qty 1

## 2013-05-01 MED ORDER — METOCLOPRAMIDE HCL 5 MG/ML IJ SOLN: 5.0000 mg | Freq: Three times a day (TID) | INTRAMUSCULAR | Status: DC | PRN

## 2013-05-01 MED ORDER — CEFAZOLIN SODIUM 1-5 GM-% IV SOLN
1.0000 g | Freq: Four times a day (QID) | INTRAVENOUS | Status: AC
  Administered 2013-05-01 (×2): 1 g via INTRAVENOUS
  Filled 2013-05-01 (×2): qty 50

## 2013-05-01 MED ORDER — ASPIRIN EC 325 MG PO TBEC
325.0000 mg | DELAYED_RELEASE_TABLET | Freq: Every day | ORAL | Status: DC
  Administered 2013-05-02: 325 mg via ORAL
  Filled 2013-05-01 (×4): qty 1

## 2013-05-01 MED ORDER — 0.9 % SODIUM CHLORIDE (POUR BTL) OPTIME
TOPICAL | Status: DC | PRN
  Administered 2013-05-01: 09:00:00 1000 mL

## 2013-05-01 MED ORDER — MENTHOL 3 MG MT LOZG
1.0000 | LOZENGE | OROMUCOSAL | Status: DC | PRN
  Filled 2013-05-01: qty 9

## 2013-05-01 MED ORDER — TRAMADOL-ACETAMINOPHEN 37.5-325 MG PO TABS: 1.0000 | ORAL_TABLET | Freq: Four times a day (QID) | ORAL | Status: DC | PRN

## 2013-05-01 MED ORDER — ACETAMINOPHEN 325 MG PO TABS: 650.0000 mg | ORAL_TABLET | Freq: Four times a day (QID) | ORAL | Status: DC | PRN

## 2013-05-01 MED ORDER — MORPHINE SULFATE 2 MG/ML IJ SOLN: 0.5000 mg | INTRAMUSCULAR | Status: DC | PRN

## 2013-05-01 MED ORDER — FENTANYL CITRATE 0.05 MG/ML IJ SOLN: 25.0000 ug | INTRAMUSCULAR | Status: DC | PRN

## 2013-05-01 MED ORDER — ASPIRIN EC 325 MG PO TBEC: 325.0000 mg | DELAYED_RELEASE_TABLET | Freq: Every day | ORAL | Status: DC

## 2013-05-01 MED ORDER — LACTATED RINGERS IV SOLN
INTRAVENOUS | Status: DC | PRN
  Administered 2013-05-01 (×2): via INTRAVENOUS

## 2013-05-01 MED ORDER — SODIUM CHLORIDE 0.9 % IV SOLN
INTRAVENOUS | Status: DC
  Administered 2013-05-01: 12:00:00 via INTRAVENOUS
  Filled 2013-05-01 (×4): qty 1000

## 2013-05-01 MED ORDER — FENTANYL CITRATE 0.05 MG/ML IJ SOLN
INTRAMUSCULAR | Status: DC | PRN
  Administered 2013-05-01 (×3): 50 ug via INTRAVENOUS

## 2013-05-01 MED ORDER — ONDANSETRON HCL 4 MG PO TABS: 4.0000 mg | ORAL_TABLET | Freq: Four times a day (QID) | ORAL | Status: DC | PRN

## 2013-05-01 MED ORDER — DEXAMETHASONE SODIUM PHOSPHATE 10 MG/ML IJ SOLN
INTRAMUSCULAR | Status: DC | PRN
  Administered 2013-05-01: 10 mg via INTRAVENOUS

## 2013-05-01 MED ORDER — ONDANSETRON HCL 4 MG/2ML IJ SOLN: 4.0000 mg | Freq: Four times a day (QID) | INTRAMUSCULAR | Status: DC | PRN

## 2013-05-01 MED ORDER — METOCLOPRAMIDE HCL 10 MG PO TABS: 5.0000 mg | ORAL_TABLET | Freq: Three times a day (TID) | ORAL | Status: DC | PRN

## 2013-05-01 MED ORDER — EPHEDRINE SULFATE 50 MG/ML IJ SOLN
INTRAMUSCULAR | Status: DC | PRN
  Administered 2013-05-01 (×3): 5 mg via INTRAVENOUS

## 2013-05-01 MED ORDER — PROMETHAZINE HCL 25 MG/ML IJ SOLN: 6.2500 mg | INTRAMUSCULAR | Status: DC | PRN

## 2013-05-01 MED ORDER — SUCCINYLCHOLINE CHLORIDE 20 MG/ML IJ SOLN
INTRAMUSCULAR | Status: DC | PRN
  Administered 2013-05-01: 100 mg via INTRAVENOUS

## 2013-05-01 MED ORDER — ONDANSETRON HCL 4 MG/2ML IJ SOLN
INTRAMUSCULAR | Status: DC | PRN
  Administered 2013-05-01 (×2): 2 mg via INTRAVENOUS

## 2013-05-01 MED ORDER — CEFAZOLIN SODIUM-DEXTROSE 2-3 GM-% IV SOLR
INTRAVENOUS | Status: AC
  Filled 2013-05-01: qty 50

## 2013-05-01 MED ORDER — PROPOFOL 10 MG/ML IV BOLUS
INTRAVENOUS | Status: DC | PRN
  Administered 2013-05-01: 150 mg via INTRAVENOUS

## 2013-05-01 MED ORDER — ACETAMINOPHEN 650 MG RE SUPP: 650.0000 mg | Freq: Four times a day (QID) | RECTAL | Status: DC | PRN

## 2013-05-01 MED ORDER — LIDOCAINE HCL (CARDIAC) 20 MG/ML IV SOLN
INTRAVENOUS | Status: DC | PRN
  Administered 2013-05-01: 50 mg via INTRAVENOUS

## 2013-05-01 MED ORDER — HYDROCODONE-ACETAMINOPHEN 5-325 MG PO TABS: 1.0000 | ORAL_TABLET | Freq: Four times a day (QID) | ORAL | Status: DC | PRN

## 2013-05-01 MED ORDER — PHENOL 1.4 % MT LIQD
1.0000 | OROMUCOSAL | Status: DC | PRN
  Filled 2013-05-01: qty 177

## 2013-05-01 MED ORDER — CEFAZOLIN SODIUM-DEXTROSE 2-3 GM-% IV SOLR
INTRAVENOUS | Status: DC | PRN
  Administered 2013-05-01: 2 g via INTRAVENOUS

## 2013-05-01 SURGICAL SUPPLY — 49 items
ADH SKN CLS APL DERMABOND .7 (GAUZE/BANDAGES/DRESSINGS) ×1
BAG SPEC THK2 15X12 ZIP CLS (MISCELLANEOUS) ×1
BAG ZIPLOCK 12X15 (MISCELLANEOUS) ×2 IMPLANT
BLADE SAW SGTL 18X1.27X75 (BLADE) ×2 IMPLANT
CAPT HIP FX BIPOLAR/UNIPOLAR ×1 IMPLANT
CLOTH BEACON ORANGE TIMEOUT ST (SAFETY) ×2 IMPLANT
DERMABOND ADVANCED (GAUZE/BANDAGES/DRESSINGS) ×1
DERMABOND ADVANCED .7 DNX12 (GAUZE/BANDAGES/DRESSINGS) ×1 IMPLANT
DRAPE INCISE IOBAN 85X60 (DRAPES) ×2 IMPLANT
DRAPE ORTHO SPLIT 77X108 STRL (DRAPES) ×4
DRAPE POUCH INSTRU U-SHP 10X18 (DRAPES) ×2 IMPLANT
DRAPE SURG 17X11 SM STRL (DRAPES) ×2 IMPLANT
DRAPE SURG ORHT 6 SPLT 77X108 (DRAPES) ×2 IMPLANT
DRAPE U-SHAPE 47X51 STRL (DRAPES) ×2 IMPLANT
DRSG AQUACEL AG ADV 3.5X10 (GAUZE/BANDAGES/DRESSINGS) ×2 IMPLANT
DRSG TEGADERM 4X4.75 (GAUZE/BANDAGES/DRESSINGS) ×2 IMPLANT
DURAPREP 26ML APPLICATOR (WOUND CARE) ×2 IMPLANT
ELECT BLADE TIP CTD 4 INCH (ELECTRODE) ×2 IMPLANT
ELECT REM PT RETURN 9FT ADLT (ELECTROSURGICAL) ×2
ELECTRODE REM PT RTRN 9FT ADLT (ELECTROSURGICAL) ×1 IMPLANT
EVACUATOR 1/8 PVC DRAIN (DRAIN) ×2 IMPLANT
FACESHIELD LNG OPTICON STERILE (SAFETY) ×8 IMPLANT
GAUZE SPONGE 2X2 8PLY STRL LF (GAUZE/BANDAGES/DRESSINGS) ×1 IMPLANT
GLOVE BIOGEL PI IND STRL 7.5 (GLOVE) ×1 IMPLANT
GLOVE BIOGEL PI IND STRL 8 (GLOVE) ×1 IMPLANT
GLOVE BIOGEL PI INDICATOR 7.5 (GLOVE) ×1
GLOVE BIOGEL PI INDICATOR 8 (GLOVE) ×1
GLOVE ECLIPSE 8.0 STRL XLNG CF (GLOVE) ×1 IMPLANT
GLOVE ORTHO TXT STRL SZ7.5 (GLOVE) ×4 IMPLANT
GLOVE SURG ORTHO 8.0 STRL STRW (GLOVE) ×2 IMPLANT
GOWN BRE IMP PREV XXLGXLNG (GOWN DISPOSABLE) ×4 IMPLANT
GOWN PREVENTION PLUS LG XLONG (DISPOSABLE) ×2 IMPLANT
HANDPIECE INTERPULSE COAX TIP (DISPOSABLE)
IMMOBILIZER KNEE 20 (SOFTGOODS) ×2
IMMOBILIZER KNEE 20 THIGH 36 (SOFTGOODS) IMPLANT
KIT BASIN OR (CUSTOM PROCEDURE TRAY) ×2 IMPLANT
MANIFOLD NEPTUNE II (INSTRUMENTS) ×2 IMPLANT
PACK TOTAL JOINT (CUSTOM PROCEDURE TRAY) ×2 IMPLANT
POSITIONER SURGICAL ARM (MISCELLANEOUS) ×2 IMPLANT
SET HNDPC FAN SPRY TIP SCT (DISPOSABLE) IMPLANT
SPONGE GAUZE 2X2 STER 10/PKG (GAUZE/BANDAGES/DRESSINGS) ×1
STRIP CLOSURE SKIN 1/2X4 (GAUZE/BANDAGES/DRESSINGS) ×2 IMPLANT
SUT ETHIBOND NAB CT1 #1 30IN (SUTURE) ×2 IMPLANT
SUT MNCRL AB 4-0 PS2 18 (SUTURE) ×2 IMPLANT
SUT VIC AB 1 CT1 36 (SUTURE) ×4 IMPLANT
SUT VIC AB 2-0 CT1 27 (SUTURE) ×4
SUT VIC AB 2-0 CT1 TAPERPNT 27 (SUTURE) ×2 IMPLANT
TOWEL OR 17X26 10 PK STRL BLUE (TOWEL DISPOSABLE) ×4 IMPLANT
TRAY FOLEY CATH 14FRSI W/METER (CATHETERS) ×1 IMPLANT

## 2013-05-01 NOTE — Progress Notes (Signed)
Patient ID: [name] Bennett, male   DOB: [birth year], 79 y.o.   MRN: [id number] Subjective: Day of Surgery   Left femoral neck fracture.  Seen this am for his left hip hemiarthroplasty    Patient reports pain as mild as long as he is not moving.  Ready for surgery, hungry  Objective:   VITALS:   Filed Vitals:   05/01/13 0506  BP: 157/68  Pulse: 73  Temp: 98.7 F (37.1 C)  Resp: 18    Not re-examined this am due to known injury and surgical plan  LABS  Recent Labs  04/28/13 1845 04/28/13 1900 04/29/13 0450  HGB 10.0* 10.9* 9.0*  HCT 30.7* 32.0* 26.9*  WBC 8.7  --  6.5  PLT 185  --  182     Recent Labs  04/28/13 1900 04/29/13 0450 04/29/13 1736  NA 138 135 134*  K 4.2 4.0 4.1  BUN 28* 26* 21  CREATININE 1.30 1.07 0.96  GLUCOSE 76 101* 86     Recent Labs  04/28/13 2320 04/29/13 1736  INR 1.09 1.17     Assessment/Plan: Left femoral neck fracture   {Plan: To OR this am for left hip hemiarthroplasty NPO Consent on chart

## 2013-05-01 NOTE — Progress Notes (Signed)
Patients BP has decreased now to 160/68 from 181/85. MD made aware. BP is declining just fine naturally after surgery.  Orders given to continue to monitor BP. Patient is stable, no pain.

## 2013-05-01 NOTE — Progress Notes (Signed)
Irrigated catheter at 0240 with no return of clots. Urine is tea colored with a small amount of blood in it. Patient is experiencing no discomfort. Will continue to monitor patient.

## 2013-05-01 NOTE — Progress Notes (Signed)
Subjective: Awake and alert post-op taking a full liquid diet - would like food. He has no pain  Objective: Lab:  Recent Labs  04/28/13 1845 04/28/13 1900 04/29/13 0450  WBC 8.7  --  6.5  NEUTROABS 7.8*  --   --   HGB 10.0* 10.9* 9.0*  HCT 30.7* 32.0* 26.9*  MCV 90.0  --  89.7  PLT 185  --  182    Recent Labs  04/28/13 1845 04/28/13 1900 04/29/13 0450 04/29/13 1736  NA 134* 138 135 134*  K 4.1 4.2 4.0 4.1  CL 99 105 101 102  GLUCOSE 75 76 101* 86  BUN 28* 28* 26* 21  CREATININE 1.07 1.30 1.07 0.96  CALCIUM 10.4  --  9.1 9.4   Micro: Urine Cx: e. Coli, Providencia Rettgeri. E coli ss to sepra; Providencia ss - Septra  Imaging:  Scheduled Meds: . [START ON 05/02/2013] aspirin EC  325 mg Oral Q breakfast  .  ceFAZolin (ANCEF) IV  1 g Intravenous Q6H  . feeding supplement  237 mL Oral TID WC  . polyethylene glycol  17 g Oral Daily  . sulfamethoxazole-trimethoprim  1 tablet Oral Q12H   Continuous Infusions: . sodium chloride 100 mL/hr at 04/30/13 2307  . sodium chloride 0.9 % 1,000 mL with potassium chloride 10 mEq infusion 50 mL/hr at 05/01/13 1216   PRN Meds:.acetaminophen, acetaminophen, bisacodyl, HYDROcodone-acetaminophen, magnesium citrate, menthol-cetylpyridinium, metoCLOPramide (REGLAN) injection, metoCLOPramide, morphine injection, ondansetron (ZOFRAN) IV, ondansetron, phenol, polyethylene glycol, traMADol-acetaminophen   Physical Exam: Filed Vitals:   05/01/13 1200  BP:   Pulse:   Temp:   Resp: 18  very thin elderly man in no distress Cor- RRR PUlm - normal respirations GU - foley in place Neuro - seems awake and alert      Assessment/Plan: 1. Ortho - s/p hemiarthoplasty this AM. Seems to be doing well  2. GU - hematuria. Keflex 4/4, Septra 2/5. Two organisms - e.coli and provdencia - both ss to septra. No clots. Has been seen by urology who recommends keeping foley in until seen for follow up as outpatient.  3. Constipation - on mag  citrate  4. Mental status - at hjis baseline.  5. Nutrition - severe protein-calorie malnutrition.  Plan  advance diet  Continue supplement  Dispo - he will need ST-SNF rehab     [name] [facility] (o) [phone]; (c) [phone] Call-grp - [facility]  Tele: [phone]  05/01/2013, 12:29 PM

## 2013-05-01 NOTE — Anesthesia Procedure Notes (Signed)
Date/Time: 05/01/2013 7:50 AM Performed by: Edison Pace Pre-anesthesia Checklist: Patient identified, Emergency Drugs available, Timeout performed, Suction available and Patient being monitored Patient Re-evaluated:Patient Re-evaluated prior to inductionOxygen Delivery Method: Circle system utilized Preoxygenation: Pre-oxygenation with 100% oxygen Intubation Type: IV induction and Cricoid Pressure applied Ventilation: Mask ventilation without difficulty Laryngoscope Size: Mac and 4 Grade View: Grade II Tube type: Oral Tube size: 7.5 mm Number of attempts: 1 Airway Equipment and Method: Stylet Placement Confirmation: ETT inserted through vocal cords under direct vision,  breath sounds checked- equal and bilateral and positive ETCO2 Secured at: 22 cm Tube secured with: Tape Dental Injury: Teeth and Oropharynx as per pre-operative assessment

## 2013-05-01 NOTE — Progress Notes (Signed)
pacu nursing:  Foley irrigated with 60 cc normal saline.  Irrigated without difficulty.  No clots seen.  Pt w/o complaints.

## 2013-05-01 NOTE — Progress Notes (Signed)
Patient has arrived back from surgery/Pacu via Pacu nurse. Patient alert x4.  Assessed patient and surgical site at bedside with pacu nurse. Patient states he is not in any pain or discomfort. BP is elevated 181/85. PACU nurse stated that anesthesiology was made aware of Patients BP,  No orders were given.  I Will contact on-call MD regarding patients BP. Apart from elevated BP, Patient is stable. Will continue to monitor. Stanton Kidney R

## 2013-05-01 NOTE — Preoperative (Signed)
Beta Blockers   Reason not to administer Beta Blockers:Not Applicable 

## 2013-05-01 NOTE — Anesthesia Preprocedure Evaluation (Signed)
Anesthesia Evaluation  Patient identified by MRN, date of birth, ID band Patient awake    Reviewed: Allergy & Precautions, H&P , NPO status , Patient's Chart, lab work & pertinent test results  Airway       Dental   Pulmonary neg pulmonary ROS,          Cardiovascular hypertension, Pt. on medications + dysrhythmias Atrial Fibrillation     Neuro/Psych negative neurological ROS  negative psych ROS   GI/Hepatic negative GI ROS, Neg liver ROS,   Endo/Other  negative endocrine ROS  Renal/GU negative Renal ROS  negative genitourinary   Musculoskeletal negative musculoskeletal ROS (+)   Abdominal   Peds negative pediatric ROS (+)  Hematology  (+) Blood dyscrasia, anemia ,   Anesthesia Other Findings   Reproductive/Obstetrics negative OB ROS                           Anesthesia Physical Anesthesia Plan  ASA: III  Anesthesia Plan: General   Post-op Pain Management:    Induction:   Airway Management Planned: Oral ETT  Additional Equipment:   Intra-op Plan:   Post-operative Plan: Extubation in OR  Informed Consent: I have reviewed the patients History and Physical, chart, labs and discussed the procedure including the risks, benefits and alternatives for the proposed anesthesia with the patient or authorized representative who has indicated his/her understanding and acceptance.   Dental advisory given  Plan Discussed with: CRNA and Surgeon  Anesthesia Plan Comments:         Anesthesia Quick Evaluation

## 2013-05-01 NOTE — Anesthesia Postprocedure Evaluation (Signed)
  Anesthesia Post-op Note  Patient: [name] Bennett  Procedure(s) Performed: Procedure(s) (LRB): ARTHROPLASTY BIPOLAR HIP (Left)  Patient Location: PACU  Anesthesia Type: General  Level of Consciousness: awake and alert   Airway and Oxygen Therapy: Patient Spontanous Breathing  Post-op Pain: mild  Post-op Assessment: Post-op Vital signs reviewed, Patient's Cardiovascular Status Stable, Respiratory Function Stable, Patent Airway and No signs of Nausea or vomiting  Last Vitals:  Filed Vitals:   05/01/13 1000  BP: 182/75  Pulse:   Temp:   Resp: 14    Post-op Vital Signs: stable   Complications: No apparent anesthesia complications

## 2013-05-01 NOTE — Transfer of Care (Signed)
Immediate Anesthesia Transfer of Care Note  Patient: [name] Bennett  Procedure(s) Performed: Procedure(s): ARTHROPLASTY BIPOLAR HIP (Left)  Patient Location: PACU  Anesthesia Type:General  Level of Consciousness: awake, oriented, patient cooperative, lethargic and responds to stimulation  Airway & Oxygen Therapy: Patient Spontanous Breathing and Patient connected to face mask oxygen  Post-op Assessment: Report given to PACU RN, Post -op Vital signs reviewed and stable and Patient moving all extremities  Post vital signs: Reviewed and stable  Complications: No apparent anesthesia complications

## 2013-05-01 NOTE — Op Note (Signed)
NAME:  [name] Bennett, [name] Bennett NO.:  [id number]   MEDICAL RECORD NO.: [id number]   LOCATION:  1435                         FACILITY:  [facility]   DATE OF BIRTH:  1918/01/22  PHYSICIAN:  Madlyn Frankel. Charlann Boxer, M.D.     DATE OF PROCEDURE:  05/01/2013                               OPERATIVE REPORT     PREOPERATIVE DIAGNOSIS:  Left displaced femoral neck fracture.   POSTOPERATIVE DIAGNOSIS:  Left displaced femoral neck fracture.   PROCEDURE:  Left hip hemiarthroplasty utilizing DePuy component, size 8 standard Summit Basic stem with a 58mm unipolar ball with a +0 adapter.   SURGEON:  Madlyn Frankel. Charlann Boxer, MD   ASSISTANT: None   ANESTHESIA:  General.   SPECIMENS:  None.   DRAINS:  None   BLOOD LOSS:  About 200 cc.   COMPLICATIONS:  None.   INDICATION OF PROCEDURE:  [name] Bennett is a pleasant 79 year old male known to me from previous right hip fracture in 2008.  He had a ground level at home injuring hi left hip.  He was admitted to the hospital after radiographs revealed a femoral neck fracture.  He was seen and evaluated and was scheduled for surgery for hemiarthroplasty.  The necessity of surgical repair was discussed with him and his family.  Consent was obtained after reviewing risks of infection, DVT, component failure, and need for revision surgery.   PROCEDURE IN DETAIL:  The patient was brought to the operative theater. Once adequate anesthesia, preoperative antibiotics, 2 g of Ancef administered, the patient was positioned into the right lateral decubitus position with the left side up.  The left lower extremity was then prepped and draped in sterile fashion.  A time-out was performed identifying the patient, planned procedure, and extremity.   A lateral incision was made off the proximal trochanter. Sharp dissection was carried down to the iliotibial band and gluteal fascia. The gluteal fascia was then incised for posterior approach.  The short external rotators  were taken down separate from the posterior capsule. An L capsulotomy was made preserving the posterior leaflet for later anatomic repair. Fracture site was identified and after removing comminuted segments of the posterior femoral neck, the femoral head was removed without difficulty and measured on the back table  using the sizing rings and determined to be 58 mm in diameter.   The proximal femur was then exposed.  Retractors placed.  I then drilled, opened the proximal femur.  Then I hand reamed once and  Irrigated the canal to try to prevent fat emboli.  I began broaching the femur with a size 1 broach up to a size 8 broach with good medial and lateral metaphyseal fit without evidence of any torsion or movement.  A trial reduction was carried out with a standard neck and a +0 adapter with a 58mm ball.  The hip reduced nicely.  The leg lengths appeared to be equal compared to the down leg.   The hip went through a range of motion without evidence of any subluxation or impingement.   Given these findings, the trial components removed.  The final 8 standard Summit Basic stem was opened.  After  irrigating the canal, the final stem was impacted and sat at the level where the broach was. Based on this and the trial reduction, a +0 adapter was opened and impacted in the 58mm unipolar ball onto a clean and dry trunnion.  The hip had been irrigated throughout the case and again at this point.  I re- Approximated the posterior capsule to the superior leaflet using a  #1 Vicryl.  The remainder of the wound was closed with #1 Vicryl in the iliotibial band and gluteal fascia, a  2-0 Vicryl in the sub-Q tissue and a running 4-0 Monocryl in the skin.  The hip was cleaned, dried, and dressed sterilely using Dermabond and Aquacel dressing.  He was then brought to recovery room, extubated in stable condition, tolerating the procedure well.           Madlyn Frankel Charlann Boxer, M.D.

## 2013-05-02 ENCOUNTER — Telehealth (HOSPITAL_COMMUNITY): Payer: Self-pay | Admitting: Emergency Medicine

## 2013-05-02 DIAGNOSIS — R339 Retention of urine, unspecified: Secondary | ICD-10-CM

## 2013-05-02 NOTE — Progress Notes (Signed)
1 Day Post-Op Subjective: Patient reports : Alert and oriented.  No pain  Objective: Vital signs in last 24 hours: Temp:  [97.3 F (36.3 C)-97.9 F (36.6 C)] 97.9 F (36.6 C) (10/05 0621) Pulse Rate:  [73-77] 74 (10/05 0621) Resp:  [16-18] 18 (10/05 0800) BP: (138-170)/(51-69) 139/51 mmHg (10/05 0621) SpO2:  [88 %-100 %] 100 % (10/05 0800)  Intake/Output from previous day: 10/04 0701 - 10/05 0700 In: 3526.7 [P.O.:540; I.V.:2936.7; IV Piggyback:50] Out: 2170 [Urine:1870; Blood:300] Intake/Output this shift: Total I/O In: 240 [P.O.:240] Out: -   Physical Exam:  General: Foley draining clear urine. Urine culture: E. Coli and Providencia rettgeri  Lab Results: No results found for this basename: HGB, HCT,  in the last 72 hours BMET  Recent Labs  04/29/13 1736  NA 134*  K 4.1  CL 102  CO2 23  GLUCOSE 86  BUN 21  CREATININE 0.96  CALCIUM 9.4    Recent Labs  04/29/13 1736  INR 1.17   No results found for this basename: LABURIN,  in the last 72 hours Results for orders placed during the hospital encounter of 04/28/13  SURGICAL PCR SCREEN     Status: None   Collection Time    04/30/13  7:06 AM      Result Value Range Status   MRSA, PCR NEGATIVE  NEGATIVE Final   Staphylococcus aureus NEGATIVE  NEGATIVE Final   Comment:            The Xpert SA Assay (FDA     approved for NASAL specimens     in patients over 43 years of age),     is one component of     a comprehensive surveillance     program.  Test performance has     been validated by [employer] for patients greater     than or equal to 28 year old.     It is not intended     to diagnose infection nor to     guide or monitor treatment.    Studies/Results: Dg Pelvis Portable  05/01/2013   CLINICAL DATA:  Status post left total hip arthroplasty  EXAM: PORTABLE PELVIS  COMPARISON:  04/29/2013  FINDINGS: Interval left hip arthroplasty. The hardware components are in anatomic alignment. There is no  fracture or subluxation identified.  Gas is identified within the soft tissues surrounding the left hip. Stable appearance of right hip arthroplasty device.  IMPRESSION: 1.  No complications status post left hip arthroplasty.   Electronically Signed   By: Signa Kell M.D.   On: 05/01/2013 09:56    Assessment/Plan:  E.coli and Providencia rettgeri UTI sensitive to current antibiotic.  Continue Septra D/S.  Leave Foley indwelling.  Dr Sherron Monday will follow as outpatient.   LOS: 4 days   [name] Bennett 05/02/2013, 10:31 AM

## 2013-05-02 NOTE — Progress Notes (Signed)
   Subjective: 1 Day Post-Op Procedure(s) (LRB): ARTHROPLASTY BIPOLAR HIP (Left)  Pt doing very well Denies pain this morning and is in good spirits Patient reports pain as none.  Objective:   VITALS:   Filed Vitals:   05/02/13 0621  BP: 139/51  Pulse: 74  Temp: 97.9 F (36.6 C)  Resp: 18    Left hip incision healing well nv intact distally No rashes or edema  LABS No results found for this basename: HGB, HCT, WBC, PLT,  in the last 72 hours   Recent Labs  04/29/13 1736  NA 134*  K 4.1  BUN 21  CREATININE 0.96  GLUCOSE 86     Assessment/Plan: 1 Day Post-Op Procedure(s) (LRB): ARTHROPLASTY BIPOLAR HIP (Left)  Pt doing well PT/OT may weight bear as tolerated left lower extremity Pain control as needed Pulmonary toilet   Brad Shaquira Moroz, MPAS, PA-C  05/02/2013, 7:56 AM

## 2013-05-02 NOTE — Progress Notes (Signed)
Clinical Social Work Department BRIEF PSYCHOSOCIAL ASSESSMENT 05/02/2013  Patient:  [name] Bennett, [name] Bennett     Account Number:  [id number]     Admit date:  04/28/2013  Clinical Social Worker:  Doroteo Glassman  Date/Time:  05/02/2013 03:46 PM  Referred by:  Physician  Date Referred:  05/02/2013 Referred for  SNF Placement   Other Referral:   Interview type:  Patient Other interview type:    PSYCHOSOCIAL DATA Living Status:  ALONE Admitted from facility:   Level of care:   Primary support name:  Lind Covert Primary support relationship to patient:  FAMILY Degree of support available:   adequate    CURRENT CONCERNS Current Concerns  Post-Acute Placement   Other Concerns:    SOCIAL WORK ASSESSMENT / PLAN Met with Pt to discuss d/c plans.    Discussed with Pt MD and PT's SNF recommendation.  Pt agreeable to SNF and gave CSW permission to send his information to all [facility], with the hope that [name] bed.    Pt declined SNF list, stating, "I'm just gonna hope for [facility]."    CSW thanked Pt for his time.   Assessment/plan status:  Psychosocial Support/Ongoing Assessment of Needs Other assessment/ plan:   Information/referral to community resources:   Attempted--Pt declined    PATIENT'S/FAMILY'S RESPONSE TO PLAN OF CARE: Pt understands that he needs SNF and is agreeable to [name].    Pt thanked CSW for time and assistance.   Providence Crosby, LCSWA Clinical Social Work [phone]

## 2013-05-02 NOTE — ED Notes (Signed)
Post ED Visit - Positive Culture Follow-up  Culture report reviewed by antimicrobial stewardship pharmacist: []  Wes Dulaney, Pharm.D., BCPS []  Celedonio Miyamoto, Pharm.D., BCPS []  Georgina Pillion, Pharm.D., BCPS []  [place], [place].D., BCPS, AAHIVP []  Estella Husk, Pharm.D., BCPS, AAHIVP [x]  Abran Duke, [address].D., BCPS  Positive urine culture Treated with Kelfex, organism sensitive to the same and no further patient follow-up is required at this time.  Kylie A Holland 05/02/2013, 2:40 PM

## 2013-05-02 NOTE — Evaluation (Signed)
Physical Therapy Evaluation Patient Details Name: [name] Bennett MRN: [id number] DOB: 1917-09-05 Today's Date: 05/02/2013 Time: 1246-1310 PT Time Calculation (min): 24 min  PT Assessment / Plan / Recommendation History of Present Illness  79 yo male very independent lives at home alone admitted 04/28/13 after falling while trying to walk up his porch stepsand his left leg "gave out". .  Pt was in ED previous night with gross hematuria, was dx with uti. Pt found with L hip fx, s/p L hip hemiarthroplasty 05/01/13.  Clinical Impression  Pt required +2 persons for standing and pivot to recliner. Pt will benefit from  PT to address problems listed. Pt will benefit from post acute rehab, probably SNF. No family  Present to discus..  PT Assessment  Patient needs continued PT services    Follow Up Recommendations  SNF    Does the patient have the potential to tolerate intense rehabilitation      Barriers to Discharge Decreased caregiver support      Equipment Recommendations  None recommended by PT    Recommendations for Other Services     Frequency Min 3X/week    Precautions / Restrictions Precautions Precautions: Posterior Hip Precaution Comments: posted precautions. Restrictions LLE Weight Bearing: Weight bearing as tolerated   Pertinent Vitals/Pain Reports that L hip is sore when pt assisted to the edge  Of the bed.Ice applied.      Mobility  Bed Mobility Bed Mobility: Supine to Sit;Sitting - Scoot to Edge of Bed Supine to Sit: 1: +2 Total assist;HOB elevated Supine to Sit: Patient Percentage: 20% Sitting - Scoot to Edge of Bed: 1: +2 Total assist Details for Bed Mobility Assistance: pt had difficulty with trunk flexion so  Bed pad utilized to get pt to edge of bed. and sitting upright. Transfers Transfers: Sit to Stand;Stand to Sit;Stand Pivot Transfers Sit to Stand: 1: +2 Total assist;From elevated surface;From bed Sit to Stand: Patient Percentage: 50% Stand to Sit: 1:  +2 Total assist;With upper extremity assist Stand to Sit: Patient Percentage: 40% Stand Pivot Transfers: 1: +2 Total assist Stand Pivot Transfers: Patient Percentage: 60% Details for Transfer Assistance: much difficulty with weight bearing on LLE, pt barely able to take any steps. Pivot to recliner. Ambulation/Gait Ambulation/Gait Assistance: Not tested (comment) Assistive device: Rolling walker    Exercises     PT Diagnosis: Difficulty walking;Generalized weakness;Acute pain  PT Problem List: Decreased strength;Decreased range of motion;Decreased activity tolerance;Decreased balance;Decreased knowledge of use of DME;Decreased cognition;Decreased coordination;Decreased mobility;Decreased knowledge of precautions;Decreased safety awareness;Pain PT Treatment Interventions: DME instruction;Gait training;Functional mobility training;Therapeutic activities;Therapeutic exercise;Balance training;Patient/family education     PT Goals(Current goals can be found in the care plan section) Acute Rehab PT Goals Patient Stated Goal: It feels good to get out of that bed. PT Goal Formulation: Patient unable to participate in goal setting Time For Goal Achievement: 05/16/13 Potential to Achieve Goals: Good  Visit Information  Last PT Received On: 05/02/13 Assistance Needed: +2 History of Present Illness: 79 yo male very independent lives at home alone admitted 04/28/13 after falling while trying to walk up his porch stepsand his left leg "gave out". .  Pt was in ED previous night with gross hematuria, was dx with uti. Pt found with L hip fx, s/p L hip hemiarthroplasty 05/01/13.       Prior Functioning  Home Living Family/patient expects to be discharged to:: Skilled nursing facility (will need) Living Arrangements: Alone Available Help at Discharge: Family;Friend(s) Type of Home: House Prior Function  Level of Independence: Independent Communication Communication: HOH    Cognition   Cognition Arousal/Alertness: Awake/alert Behavior During Therapy: WFL for tasks assessed/performed Overall Cognitive Status: No family/caregiver present to determine baseline cognitive functioning Area of Impairment: Memory Memory: Decreased recall of precautions;Decreased short-term memory General Comments: did not recall he had surgery yesterday.    Extremity/Trunk Assessment Upper Extremity Assessment Upper Extremity Assessment: Generalized weakness Lower Extremity Assessment Lower Extremity Assessment: LLE deficits/detail LLE Deficits / Details: decreased support of LLE in atanding. Cervical / Trunk Assessment Cervical / Trunk Assessment: Kyphotic   Balance Balance Balance Assessed: Yes Static Sitting Balance Static Sitting - Balance Support: Bilateral upper extremity supported Static Sitting - Level of Assistance: 3: Mod assist Static Sitting - Comment/# of Minutes: tends to lean posteriorly, encouraged forward weight shifting.  End of Session PT - End of Session Equipment Utilized During Treatment: Gait belt Activity Tolerance: Patient limited by fatigue;Patient limited by pain Patient left: in chair;with call bell/phone within reach Nurse Communication: Mobility status  GP     Rada Hay 05/02/2013, 2:11 PM Blanchard Kelch PT [phone]

## 2013-05-02 NOTE — Progress Notes (Signed)
Subjective: Feeling OK, no complaints. Hopes for BM today  Objective: Lab: No results found for this basename: WBC, NEUTROABS, HGB, HCT, MCV, PLT,  in the last 72 hours  Recent Labs  04/29/13 1736  NA 134*  K 4.1  CL 102  GLUCOSE 86  BUN 21  CREATININE 0.96  CALCIUM 9.4    Imaging: no new imaging  Scheduled Meds: . aspirin EC  325 mg Oral Q breakfast  . feeding supplement  237 mL Oral TID WC  . polyethylene glycol  17 g Oral Daily  . sulfamethoxazole-trimethoprim  1 tablet Oral Q12H   Continuous Infusions: . sodium chloride 1 mL (05/02/13 1101)  . sodium chloride 0.9 % 1,000 mL with potassium chloride 10 mEq infusion 50 mL/hr at 05/01/13 1216   PRN Meds:.acetaminophen, acetaminophen, bisacodyl, HYDROcodone-acetaminophen, magnesium citrate, menthol-cetylpyridinium, metoCLOPramide (REGLAN) injection, metoCLOPramide, morphine injection, ondansetron (ZOFRAN) IV, ondansetron, phenol, polyethylene glycol, traMADol-acetaminophen   Physical Exam: Filed Vitals:   05/02/13 0800  BP:   Pulse:   Temp:   Resp: 18  gen'l - very elderly AA man in no distress Pulm - normal respirations w/o increased WOB Cor - 2+ radial, RRR Abd - soft. Ext - left hip dressing dry      Assessment/Plan: 1. Ortho - POD #1 - doing well. Per ortho note ready for PT  2. GU - appreciate Dr. Madilyn Hook note. Will continue foley catheter as recommended (see order). Septra DS #3/7.  3. Constipation - waiting for results  4. Mental status - awake and alert. Doing well  5. Nutrition - patient reports he only eats bkfst and suppper, not dinner. Plan  nutritional lab markers  Continue supplement  Dispo - ST-SNF   [name] (o) [phone]; (c) [phone] Call-grp - [facility]  Tele: [phone]  05/02/2013, 12:08 PM

## 2013-05-02 NOTE — Progress Notes (Addendum)
Clinical Social Work Department CLINICAL SOCIAL WORK PLACEMENT NOTE 05/02/2013  Patient:  DIDIER, BRANDENBURG  Account Number:  [id number] Admit date:  04/28/2013  Clinical Social Worker:  Doroteo Glassman  Date/time:  05/02/2013 03:49 PM  Clinical Social Work is seeking post-discharge placement for this patient at the following level of care:   SKILLED NURSING   (*CSW will update this form in Epic as items are completed)   declined  Patient/family provided with [facility] Department of Clinical Social Work's list of facilities offering this level of care within the geographic area requested by the patient (or if unable, by the patient's family).  05/02/2013  Patient/family informed of their freedom to choose among providers that offer the needed level of care, that participate in Medicare, Medicaid or managed care program needed by the patient, have an available bed and are willing to accept the patient.  05/02/2013  Patient/family informed of [facility]' ownership interest in [facility], as well as of the fact that they are under no obligation to receive care at this facility.  PASARR submitted to EDS on 05/02/2013 PASARR number received from EDS on 05/02/2013  FL2 transmitted to all facilities in geographic area requested by pt/family on  05/02/2013 FL2 transmitted to all facilities within larger geographic area on   Patient informed that his/her managed care company has contracts with or will negotiate with  certain facilities, including the following:     Patient/family informed of bed offers received:  05/03/13 Patient chooses bed at [facility] Physician recommends and patient chooses bed at    Patient to be transferred to [facility] on   Patient to be transferred to facility by   The following physician request were entered in Epic:   Additional Comments:  Providence Crosby, Theresia Majors Clinical Social Work [phone]

## 2013-05-03 ENCOUNTER — Non-Acute Institutional Stay (SKILLED_NURSING_FACILITY): Payer: Medicare Other | Admitting: Internal Medicine

## 2013-05-03 ENCOUNTER — Encounter (HOSPITAL_COMMUNITY): Payer: Self-pay | Admitting: Orthopedic Surgery

## 2013-05-03 DIAGNOSIS — Z96649 Presence of unspecified artificial hip joint: Secondary | ICD-10-CM | POA: Insufficient documentation

## 2013-05-03 DIAGNOSIS — E43 Unspecified severe protein-calorie malnutrition: Secondary | ICD-10-CM

## 2013-05-03 DIAGNOSIS — R319 Hematuria, unspecified: Secondary | ICD-10-CM

## 2013-05-03 DIAGNOSIS — I10 Essential (primary) hypertension: Secondary | ICD-10-CM

## 2013-05-03 LAB — BASIC METABOLIC PANEL
CO2: 24 mEq/L (ref 19–32)
Calcium: 9 mg/dL (ref 8.4–10.5)
Chloride: 100 mEq/L (ref 96–112)
Creatinine, Ser: 1.16 mg/dL (ref 0.50–1.35)
GFR calc Af Amer: 61 mL/min — ABNORMAL LOW (ref >90)
GFR calc non Af Amer: 52 mL/min — ABNORMAL LOW (ref >90)
Sodium: 131 mEq/L — ABNORMAL LOW (ref 135–145)

## 2013-05-03 LAB — HEPATIC FUNCTION PANEL
ALT: 6 U/L (ref 0–53)
Bilirubin, Direct: <0.1 mg/dL (ref 0.0–0.3)
Total Bilirubin: 0.3 mg/dL (ref 0.3–1.2)
Total Protein: 5.8 g/dL — ABNORMAL LOW (ref 6.0–8.3)

## 2013-05-03 LAB — PREALBUMIN: Prealbumin: 9.5 mg/dL — ABNORMAL LOW (ref 17.0–34.0)

## 2013-05-03 MED ORDER — SULFAMETHOXAZOLE-TMP DS 800-160 MG PO TABS: 1.0000 | ORAL_TABLET | Freq: Two times a day (BID) | ORAL | Status: DC

## 2013-05-03 MED ORDER — ASPIRIN EC 325 MG PO TBEC
325.0000 mg | DELAYED_RELEASE_TABLET | Freq: Two times a day (BID) | ORAL | Status: DC
  Administered 2013-05-03: 08:00:00 325 mg via ORAL
  Filled 2013-05-03 (×3): qty 1

## 2013-05-03 MED ORDER — ASPIRIN 325 MG PO TBEC: 325.0000 mg | DELAYED_RELEASE_TABLET | Freq: Two times a day (BID) | ORAL | Status: AC

## 2013-05-03 MED ORDER — POLYETHYLENE GLYCOL 3350 17 G PO PACK: 17.0000 g | PACK | Freq: Every day | ORAL | Status: DC

## 2013-05-03 MED ORDER — FERROUS SULFATE 325 (65 FE) MG PO TABS: 325.0000 mg | ORAL_TABLET | Freq: Two times a day (BID) | ORAL | Status: DC

## 2013-05-03 MED ORDER — MAGNESIUM CITRATE PO SOLN: 1.0000 | Freq: Every day | ORAL | Status: DC | PRN

## 2013-05-03 MED ORDER — ACETAMINOPHEN 325 MG PO TABS: 650.0000 mg | ORAL_TABLET | Freq: Four times a day (QID) | ORAL | Status: DC | PRN

## 2013-05-03 MED ORDER — HYDROCODONE-ACETAMINOPHEN 5-325 MG PO TABS: 1.0000 | ORAL_TABLET | Freq: Four times a day (QID) | ORAL | Status: DC | PRN

## 2013-05-03 MED ORDER — ENSURE COMPLETE PO LIQD: 237.0000 mL | Freq: Three times a day (TID) | ORAL | Status: DC

## 2013-05-03 NOTE — Discharge Summary (Signed)
[name] Bennett, [name] Bennett              ACCOUNT NO.:  [id number]  MEDICAL RECORD NO.:  [id number]  LOCATION:  1419                         FACILITY:  [facility]  PHYSICIAN:  Rosalyn Gess. Norins, MD  DATE OF BIRTH:  Feb 19, 1918  DATE OF ADMISSION:  04/28/2013 DATE OF DISCHARGE:  05/03/2013                              DISCHARGE SUMMARY   ADMITTING DIAGNOSES: 1. Generalized weakness. 2. Hypertension. 3. Hematuria. 4. Urinary tract infection. 5. Anemia. 6. Left leg pain.  DISCHARGE DIAGNOSES: 1. Subcapital impaction fracture left femoral neck, status post     hemiarthroplasty. 2. Urinary tract infection with hematuria. 3. Bladder outlet obstruction. 4. Anemia. 5. Severe protein-calorie malnutrition.  CONSULTANTS:  Madlyn Frankel. Charlann Boxer, M.D. for Orthopedic Surgery and Lindaann Slough, M.D., for Urology.  PROCEDURES:  Imaging: 1. Pelvis 2 views on day of admission, which revealed osseous     demineralization, no acute left hip abnormality, interval right hip     arthroplasty. 2. CT of the head without contrast on day of admission which revealed     scattered motion artifacts, generalized atrophy, normal ventricular     morphology, no midline shift or mass effect, small vessel chronic     ischemic changes, scattered falcine calcification.  No intracranial     hemorrhage, mass, or evidence of acute infarction. 3. X-ray of the left femur from April 28, 2013, day of admission with     possible impacted subcapital fracture of the left hip. 4. CT of the left hip performed April 29, 2013, which showed impacted     subcapital fracture of the left femoral neck. 5. Diagnostic view of pelvis portable May 01, 2013, which showed no     complications status post left hip arthroplasty.  HISTORY OF PRESENT ILLNESS:  [name] Bennett is a 79 year old gentleman, who has been living independently at home.  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  The patient presented to the emergency department after falling in his yard while walking up his fourth step when he reports his left leg gave out.  He had no loss of consciousness or syncope.  Of note, the patient had been in the emergency department the night prior to that with gross hematuria, diagnosed with a UTI and started on Keflex.  The patient was scheduled for outpatient followup with Urology.  The patient had been in and out cathing himself on a p.r.n. basis, most likely secondary to obstructive disease.  The patient denied having any fever, no lightheadedness, no abdominal pain, or suprapubic pain.  The patient did have significant pain with movement of his left leg and reported it was located in the inner thigh area.  Although, the patient reports he has been eating and drinking well, he looks cachectic.  The patient's hemoglobin was 10 at the time of admission.  The patient was subsequently admitted because of his multiple problems as above.  Please see the H and P for past medical history, family history, social history, and admission examination also prior records in [place].  HOSPITAL COURSE: 1. Ortho:  The patient with a  subcapital intertrochanteric fracture     left hip status post hemiarthroplasty.  He has done very well     postoperatively and has been able to begin working with physical     therapy.  His pain has been minimal.  At this point, the patient is     ready for discharge to a skilled care facility for ongoing PT and     OT in regards to recovery from his hip fracture. 2. Urology:  The patient had gross hematuria as well as bladder outlet     obstruction requiring placement of an indwelling Foley catheter.     It was Urology's recommendation that the patient complete a course     of antibiotic therapy for his UTI.  He is to continue to have an     indwelling Foley catheter until seen in the office for  followup by     Dr. Alfredo Martinez.  Appointment has been scheduled for Friday     May 07, 2013, 1 p.m. in the afternoon, and he will need to be     transported from his facility to his appointment. 3. Chronic constipation:  The patient usually takes magnesium citrate     on a regular basis.  He reports he has not had a bowel movement in     the last 2 days and has had a dose of magnesium citrate today.     Abdomen is unremarkable on exam. 4. Mental status:  The patient has remained awake, alert, and     oriented. 5. Nutrition:  The patient does report he has been eating breakfast     and supper, not dinner, and feels he has been getting adequate     nutrition.  The patient was seen and evaluated by Nutrition     Services, who felt that the patient did have significant and severe     protein-calorie malnutrition.  The patient did have a low albumin     at 3.1, total protein was normal at 6.5.  Prealbumin is pending.  With the patient having successfully had repair of his left hip fracture and otherwise being generally medically stable, he is ready for transfer to a skilled care facility to have rehab with OT/PT.  He is to continue and complete a course of Septra for urinary tract infection.  The patient is to have followup appointments with Dr. Lorin Picket MacDiarmid as noted above and also with Dr. Durene Romans as scheduled.  DISCHARGE EXAMINATION:  VITAL SIGNS:  Temperature was 98, blood pressure 122/50, heart rate 65, respirations 18, and oxygen saturations 100% on room air. GENERAL APPEARANCE:  This is a tall, very thin if not cachectic, African American gentleman in no acute distress. HEENT:  Conjunctivae and sclerae were slightly muddied.  Pupils equal, round, and reactive.  Oropharynx with no lesions, but had chipped cap tooth on the front tooth left. NECK:  Supple without thyromegaly, nodes.  No adenopathy was noted in the submandibular and cervical regions. PULMONARY:  The  patient has got normal respirations with no increased work of breathing.  No wheezes, rales, or rhonchi are appreciated. CARDIOVASCULAR:  Radial pulse 2+ on the right.  His precordium is quiet. His heart sounds were distant but regular with no murmur, rub, or gallop appreciated. ABDOMEN:  The patient has bowel sounds in all 4 quadrants.  No guarding, rebound, or tenderness was elicited. GENITALIA:  The patient with an indwelling Foley catheter, currently draining clear yellow urine. EXTREMITIES:  The  patient had left hip arthroplasty.  Dressing was not examined by this dictator, but he has been seen and followed by Orthopedics and is thought to be stable doing well. NEURO:  The patient is awake, alert.  He is oriented to person, place, time, and context and does seem to be mentally competent. DERM:  The patient has dry skin, but no obvious lesions were noted.  His sacrum was not examined.  FINAL LABORATORY:  From the day of discharge; sodium was 131, potassium 5, chloride of 100, CO2 of 24, BUN of 17, creatinine 1.16, and glucose was 86.  Alkaline phosphatase was 46.  Albumin was 2.3 and low.  Liver functions were otherwise normal.  Total bilirubin was 0.3.  The patient had negative cardiac enzymes; although, his CK total was mildly elevated at 297.  Iron levels were drawn at admission with an iron of 20 which is low, TIBC of 245, iron percent saturation was 8%.  Vitamin B12 was normal at 714.  Final CBC from April 29, 2013 with a white count of 6500, hemoglobin of 9, hematocrit 26.9%, platelet count 182,000.  Retic count from April 28, 2013 with RBC just slightly low at 3.28.  Percent reticulocyte was 0.5%, which is normal range.  Manual reticulocyte count was 16.4, which is slightly low and this may be due to chronic malnutrition.  The patient's last PSA was September 15, 2011 and was low normal at 0.23.  The patient had a positive urinalysis at time of admission with red blood  cells positive for nitrite, small leukocyte esterase, wbc's 7-10, rbc's too numerous to count, and rare bacteria. Urine culture positive for E. coli and for Providencia rettgeri, both bacterial species sensitive to trimethoprim and sulfamethoxazole.  DISCHARGE MEDICATIONS: 1. Tylenol 650 mg p.o. q.6 p.r.n. mild pain or discomfort. 2. Aspirin 325 mg 2 times daily until seen in followup by Dr. Durene Romans. 3. Ensure complete 3 times daily with meals. 4. Furosemide 40 mg daily. 5. Norco 5/325 p.r.n. severe pain. 6. Losartan 50 mg daily. 7. Magnesium citrate 1 bottle daily p.r.n. constipation. 8. MiraLAX 17 g daily. 9. Bactrim double strength 1 b.i.d. for additional 5 days. 10.Ultracet 37.5/325 1-2 tablets every 6 hours as needed for mild-to-     moderate pain.  DISPOSITION:  The patient to be transferred to a skilled care facility for OT and PT.  The patient will be seen by his primary care physician, Dr. Debby Bud within 7 days following discharge from the skilled care facility.  The patient's code status is full code.  The patient's condition at time of discharge dictation is stable and improved, status post surgery with attention needing to be paid to his nutritional status.  The patient will also need home social work visit after he is discharged from facility.  FOLLOWUP APPOINTMENTS:  With Dr. Charlann Boxer as noted.  With Dr. Alfredo Martinez on Friday May 07, 2013, at 1300 hours.  With Dr. Ranell Patrick 7 days following discharge from SNF.  The patient's condition at time of discharge dictation is stable, although guarded given his very advanced age.     Rosalyn Gess Norins, MD     MEN/MEDQ  D:  05/03/2013  T:  05/03/2013  Job:  [id number]  cc:   Madlyn Frankel Charlann Boxer, M.D. Fax: [phone]  Martina Sinner, MD Fax: [phone]

## 2013-05-03 NOTE — Evaluation (Signed)
Occupational Therapy Evaluation Patient Details Name: ACEN CRAUN MRN: [id number] DOB: [birth year] Today's Date: 05/03/2013 Time: 1308-6578 OT Time Calculation (min): 30 min  OT Assessment / Plan / Recommendation History of present illness 79 yo male very independent lives at home alone admitted 04/28/13 after falling while trying to walk up his porch stepsand his left leg "gave out". .  Pt was in ED previous night with gross hematuria, was dx with uti. Pt found with L hip fx, s/p L hip hemiarthroplasty 05/01/13.   Clinical Impression   Pt tolerated up to [facility] and then recliner. Will benefit from additional OT to improve ADL independence, further educate on AE and hip precautions also.    OT Assessment  Patient needs continued OT Services    Follow Up Recommendations  SNF;Supervision/Assistance - 24 hour    Barriers to Discharge      Equipment Recommendations  3 in 1 bedside comode    Recommendations for Other Services    Frequency  Min 2X/week    Precautions / Restrictions Precautions Precautions: Posterior Hip Restrictions LLE Weight Bearing: Weight bearing as tolerated   Pertinent Vitals/Pain 100% sats on RA; pt didn't complain of pain.    ADL  Eating/Feeding: Simulated;Independent Where Assessed - Eating/Feeding: Chair Grooming: Simulated;Wash/dry hands;Set up Where Assessed - Grooming: Supported sitting Upper Body Bathing: Simulated;Chest;Right arm;Left arm;Abdomen;Min guard Where Assessed - Upper Body Bathing: Unsupported sitting Lower Body Bathing: +2 Total assistance Lower Body Bathing: Patient Percentage: 30% Where Assessed - Lower Body Bathing: Supported sit to stand Upper Body Dressing: Simulated;Minimal assistance Where Assessed - Upper Body Dressing: Unsupported sitting Lower Body Dressing: Simulated;+2 Total assistance Lower Body Dressing: Patient Percentage: 10% Where Assessed - Lower Body Dressing: Supported sit to stand Toilet Transfer: Performed;+2  Total assistance Toilet Transfer: Patient Percentage: 50% Statistician Method: Surveyor, minerals: Materials engineer and Hygiene: Simulated;+2 Total assistance Toileting - Architect and Hygiene: Patient Percentage: 0% Where Assessed - Engineer, mining and Hygiene: Sit to stand from 3-in-1 or toilet Equipment Used: Gait belt;Rolling walker ADL Comments: Started AE education with reacher with pt. Will need additional education and practice.     OT Diagnosis: Generalized weakness  OT Problem List: Decreased strength;Decreased knowledge of precautions;Decreased knowledge of use of DME or AE OT Treatment Interventions: Self-care/ADL training;DME and/or AE instruction;Therapeutic activities;Patient/family education   OT Goals(Current goals can be found in the care plan section) Acute Rehab OT Goals Patient Stated Goal: pt glad to get OOB OT Goal Formulation: With patient Time For Goal Achievement: 05/17/13 Potential to Achieve Goals: Good  Visit Information  Last OT Received On: 05/03/13 Assistance Needed: +2 History of Present Illness: 79 yo male very independent lives at home alone admitted 04/28/13 after falling while trying to walk up his porch stepsand his left leg "gave out". .  Pt was in ED previous night with gross hematuria, was dx with uti. Pt found with L hip fx, s/p L hip hemiarthroplasty 05/01/13.       Prior Functioning     Home Living Family/patient expects to be discharged to:: Skilled nursing facility Living Arrangements: Alone Prior Function Level of Independence: Independent Communication Communication: HOH         Vision/Perception     Cognition  Cognition Arousal/Alertness: Awake/alert Behavior During Therapy: WFL for tasks assessed/performed Overall Cognitive Status: Within Functional Limits for tasks assessed    Extremity/Trunk Assessment Upper Extremity  Assessment Upper Extremity Assessment: Overall WFL for tasks assessed  Mobility Bed Mobility Supine to Sit: 1: +2 Total assist;HOB elevated Supine to Sit: Patient Percentage: 50% Details for Bed Mobility Assistance: assist for LEs over to EOB and trunk to upright. Transfers Transfers: Sit to Stand;Stand to Sit Sit to Stand: 1: +2 Total assist;With upper extremity assist;From bed;From chair/3-in-1 Sit to Stand: Patient Percentage: 50% Stand to Sit: 1: +2 Total assist;With upper extremity assist;To chair/3-in-1 Stand to Sit: Patient Percentage: 50% Details for Transfer Assistance: mulimodal cues for hand placement and sequence. assist to rise and steady as well as control descent. Also assist to slide L LE out in front before transitions and to find armrest of chair/BSC     Exercise     Balance Balance Balance Assessed: Yes Static Sitting Balance Static Sitting - Balance Support: Bilateral upper extremity supported Static Sitting - Level of Assistance: 4: Min assist (min guard)   End of Session OT - End of Session Equipment Utilized During Treatment: Gait belt Activity Tolerance: Patient tolerated treatment well Patient left: in chair;with call bell/phone within reach;with chair alarm set  GO     Lennox Laity [phone] 05/03/2013, 11:22 AM

## 2013-05-03 NOTE — Progress Notes (Signed)
CSW faxed d/c summary to [facility] per request of CSW Hardy G.  Followed up with Bjorn Loser at [facility] who confirmed receipt of summary.  Per Bjorn Loser ok for nurse to call report and send patient to facility.  CSW spoke with nurse Dreama who reported that she would call in report and transport for patient.   Marva Panda, Theresia Majors  [phone]  .05/03/2013

## 2013-05-03 NOTE — Progress Notes (Signed)
Clinical Social Work  CSW met with patient at bedside and provided bed offers. Patient agreeable to placement at [facility] and agreeable for nieces to be involved with plan. CSW called and left a message for [place] with CSW contact information. CSW spoke with [facility] who can admit today if medically stable. CSW will continue to follow for assistance with DC planning.  Unk Lightning, LCSW (Coverage for [employer])

## 2013-05-03 NOTE — Progress Notes (Signed)
Report was called to RN Tyrone Sage at 6:10 PM. Transportation arranged.  Chesley Mires R

## 2013-05-03 NOTE — Progress Notes (Signed)
Subjective: Feeling pretty good. Has a good appetite  Objective: Lab: No results found for this basename: WBC, NEUTROABS, HGB, HCT, MCV, PLT,  in the last 72 hours  Recent Labs  05/03/13 0445  NA 131*  K 5.0  CL 100  GLUCOSE 86  BUN 17  CREATININE 1.16  CALCIUM 9.0    Imaging:  Scheduled Meds: . aspirin EC  325 mg Oral BID  . feeding supplement  237 mL Oral TID WC  . polyethylene glycol  17 g Oral Daily  . sulfamethoxazole-trimethoprim  1 tablet Oral Q12H   Continuous Infusions: . sodium chloride 1 mL (05/02/13 1101)  . sodium chloride 0.9 % 1,000 mL with potassium chloride 10 mEq infusion 50 mL/hr at 05/01/13 1216   PRN Meds:.acetaminophen, acetaminophen, bisacodyl, HYDROcodone-acetaminophen, magnesium citrate, menthol-cetylpyridinium, metoCLOPramide (REGLAN) injection, metoCLOPramide, morphine injection, ondansetron (ZOFRAN) IV, ondansetron, phenol, polyethylene glycol, traMADol-acetaminophen   Physical Exam: Filed Vitals:   05/03/13 1200  BP:   Pulse:   Temp:   Resp: 18    See d/c summary    Assessment/Plan: For transfer to SNF.  Priority dictation # [phone]   [name] [facility] ([phone]; (c) [phone] Call-grp - [facility]  Tele: [phone]  05/03/2013, 1:22 PM

## 2013-05-03 NOTE — Progress Notes (Signed)
Patient ID: [name] Bennett, male   DOB: Oct 03, 1917, 79 y.o.   MRN: [id number] Subjective: 2 Days Post-Op Procedure(s) (LRB): ARTHROPLASTY BIPOLAR HIP (Left)    Patient reports pain as mild.  Resting comfortably this am.  No events noted  Objective:   VITALS:   Filed Vitals:   05/03/13 0737  BP:   Pulse:   Temp:   Resp: 18    Neurovascular intact Incision: dressing C/D/I  LABS No results found for this basename: HGB, HCT, WBC, PLT,  in the last 72 hours   Recent Labs  05/03/13 0445  NA 131*  K 5.0  BUN 17  CREATININE 1.16  GLUCOSE 86    No results found for this basename: LABPT, INR,  in the last 72 hours   Assessment/Plan: 2 Days Post-Op Procedure(s) (LRB): ARTHROPLASTY BIPOLAR HIP (Left)   Advance diet Up with therapy Discharge to SNF most likely when medically stable  Aspirin for DVT prophylaxis WBAT LLE Follow up with Lisseth Brazeau in 2 weeks, [facility] - [phone] Ultram for pain

## 2013-05-03 NOTE — Discharge Summary (Signed)
[name], [name] Bennett              ACCOUNT NO.:  [id number]  MEDICAL RECORD NO.:  [id number]  LOCATION:  1419                         FACILITY:  [facility]  PHYSICIAN:  Rosalyn Gess. Laydon Martis, MD  DATE OF BIRTH:  July 14, 1918  DATE OF ADMISSION:  04/28/2013 DATE OF DISCHARGE:                              DISCHARGE SUMMARY   ADDENDUM  Reviewing the patient's discharge lab, patient is significantly iron deficient.  He has anemia both based on chronic disease with a minimally depressed reticulocyte count as well as significant iron deficiency, most likely due to GU blood loss.  PLAN:  The patient will start on iron replacement 325 mg tabs b.i.d.  He will need to have follow up lab work when seen in the office after skilled care facility discharge.     Rosalyn Gess Tylik Treese, MD     MEN/MEDQ  D:  05/03/2013  T:  05/03/2013  Job:  [id number]

## 2013-05-03 NOTE — Progress Notes (Signed)
Physical Therapy Treatment Patient Details Name: [name] Bennett MRN: [id number] DOB: [birth year] Today's Date: 05/03/2013 Time: 8295-6213 PT Time Calculation (min): 29 min  PT Assessment / Plan / Recommendation  History of Present Illness 79 yo male very independent lives at home alone admitted 04/28/13 after falling while trying to walk up his porch stepsand his left leg "gave out". .  Pt was in ED previous night with gross hematuria, was dx with uti. Pt found with L hip fx, s/p L hip hemiarthroplasty 05/01/13.   PT Comments   Pt improving in mobility and tolerance to standing with RW. Pt able to take a few steps.  Follow Up Recommendations  SNF     Does the patient have the potential to tolerate intense rehabilitation     Barriers to Discharge        Equipment Recommendations       Recommendations for Other Services    Frequency Min 3X/week   Progress towards PT Goals Progress towards PT goals: Progressing toward goals  Plan Current plan remains appropriate    Precautions / Restrictions Precautions Precautions: Posterior Hip Precaution Comments: posted precautions. Restrictions LLE Weight Bearing: Weight bearing as tolerated   Pertinent Vitals/Pain Does not report pain except when hip flexed in sitting.    Mobility  Bed Mobility Supine to Sit: 1: +2 Total assist;HOB elevated Supine to Sit: Patient Percentage: 50% Sitting - Scoot to Edge of Bed: 1: +2 Total assist Details for Bed Mobility Assistance: assist for LEs over to EOB and trunk to upright. Transfers Sit to Stand: 1: +2 Total assist;With upper extremity assist;From bed;From chair/3-in-1 Sit to Stand: Patient Percentage: 50% Stand to Sit: 1: +2 Total assist;With upper extremity assist;To chair/3-in-1 Stand to Sit: Patient Percentage: 50% Stand Pivot Transfers: 1: +2 Total assist Stand Pivot Transfers: Patient Percentage: 60% Details for Transfer Assistance: mulimodal cues for hand placement and sequence. assist  to rise and steady as well as control descent. Also assist to slide L LE out in front before transitions and to find armrest of chair/BSC Ambulation/Gait Ambulation/Gait Assistance: 1: +2 Total assist Ambulation/Gait: Patient Percentage: 50% Ambulation Distance (Feet): 5 Feet Assistive device: Rolling walker Ambulation/Gait Assistance Details: pt improved with taking steps from [facility] to recliner with mutimodal cues for safe use of RW and sequence. Gait Pattern: Step-to pattern;Trunk flexed;Decreased step length - left;Decreased stance time - left    Exercises Total Joint Exercises Long Arc Quad: AROM;Left;10 reps;Seated   PT Diagnosis:    PT Problem List:   PT Treatment Interventions:     PT Goals (current goals can now be found in the care plan section) Acute Rehab PT Goals Patient Stated Goal: pt glad to get OOB  Visit Information  Last PT Received On: 05/03/13 Assistance Needed: +2 History of Present Illness: 79 yo male very independent lives at home alone admitted 04/28/13 after falling while trying to walk up his porch stepsand his left leg "gave out". .  Pt was in ED previous night with gross hematuria, was dx with uti. Pt found with L hip fx, s/p L hip hemiarthroplasty 05/01/13.    Subjective Data  Patient Stated Goal: pt glad to get OOB   Cognition  Cognition Arousal/Alertness: Awake/alert Behavior During Therapy: WFL for tasks assessed/performed Overall Cognitive Status: Within Functional Limits for tasks assessed Area of Impairment: Memory Memory: Decreased recall of precautions;Decreased short-term memory    Balance  Balance Balance Assessed: Yes Static Sitting Balance Static Sitting - Balance Support: Bilateral upper extremity  supported Static Sitting - Level of Assistance: 4: Min assist (min guard)  End of Session PT - End of Session Equipment Utilized During Treatment: Gait belt Activity Tolerance: Patient tolerated treatment well Patient left: in chair;with  call bell/phone within reach;with chair alarm set Nurse Communication: Mobility status   GP     Rada Hay 05/03/2013, 12:12 PM

## 2013-05-03 NOTE — Progress Notes (Signed)
MRN: [id number] Name: [name] Bennett  Sex: male Age: 79 y.o. DOB: [birth year]  PSC #: [name] Facility/Room: 115 Level Of Care: SNF Provider: Merrilee Seashore D Emergency Contacts: Extended Emergency Contact Information Primary Emergency Contact: Beaulah [place] Home Phone: [phone] Relation: Niece Secondary Emergency Contact: Deignan,Beverly  [place] Home Phone: [phone] Relation: Niece  Code Status: FULL  Allergies: Review of patient's allergies indicates no known allergies.  Chief Complaint  Patient presents with  . nursing home admission    HPI: Patient is 79 y.o. male who is being admitted for OT/PT s/p fall and L hip arthroplasty.  Past Medical History  Diagnosis Date  . Hypertension   . Urinary retention   . H/O total hip arthroplasty 05/01/2013    Past Surgical History  Procedure Laterality Date  . Fracture surgery    . Hip surgery    . Hip arthroplasty Left 05/01/2013    Procedure: ARTHROPLASTY BIPOLAR HIP;  Surgeon: Shelda Pal, MD;  Location: [facility];  Service: Orthopedics;  Laterality: Left;      Medication List    Notice   This visit is during an admission. Changes to the med list made in this visit will be reflected in the After Visit Summary of the admission.    Medications should have come over on this template;all meds were reviewed and verified per me.  No orders of the defined types were placed in this encounter.     There is no immunization history on file for this patient.  History  Substance Use Topics  . Smoking status: Never Smoker   . Smokeless tobacco: Not on file  . Alcohol Use: No    Family history is noncontributory    Review of Systems  DATA OBTAINED: from patient PT HAS NO C/O PAIN IN HIP AND NO C/O AT ALL GENERAL: Feels well no fevers, fatigue, appetite changes SKIN: No itching, rash or wounds EYES: No eye pain, redness, discharge EARS: No earache, tinnitus, change in  hearing NOSE: No congestion, drainage or bleeding  MOUTH/THROAT: No mouth or tooth pain, No sore throat, No difficulty chewing or swallowing  RESPIRATORY: No cough, wheezing, SOB CARDIAC: No chest pain, palpitations, lower extremity edema  GI: No abdominal pain, No N/V/D or constipation, No heartburn or reflux  GU: No dysuria, frequency or urgency, or incontinence  MUSCULOSKELETAL: No unrelieved bone/joint pain NEUROLOGIC: No headache, dizziness or focal weakness PSYCHIATRIC: No overt anxiety or sadness. Sleeps well. No behavior issue.   Filed Vitals:   05/05/13 1614  BP: 95/56  Pulse: 73  Temp: 97.4 F (36.3 C)  Resp: 19    Physical Exam  GENERAL APPEARANCE: Alert, conversant. Appropriately groomed. No acute distress.  SKIN: No diaphoresis rash, or wounds HEAD: Normocephalic, atraumatic  EYES: Conjunctiva/lids clear. Pupils round, reactive. EOMs intact.  EARS: External exam WNL, canals clear. Hearing grossly normal.  NOSE: No deformity or discharge.  MOUTH/THROAT: Lips w/o lesions. RESPIRATORY: Breathing is even, unlabored. Lung sounds are clear   CARDIOVASCULAR: Heart RRR no murmurs, rubs or gallops. No peripheral edema.  GASTROINTESTINAL: Abdomen is soft, non-tender, not distended w/ normal bowel sounds. GENITOURINARY: Bladder non tender, not distended  MUSCULOSKELETAL: No abnormal joints or musculature NEUROLOGIC: Oriented X3. Cranial nerves 2-12 grossly intact. Moves all extremities no tremor. PSYCHIATRIC: Mood and affect appropriate to situation, no behavioral issues  Patient Active Problem List   Diagnosis Date Noted  . H/O total hip arthroplasty   . Protein-calorie malnutrition, severe  04/30/2013  . Weakness generalized 04/28/2013  . Left leg pain 04/28/2013  . Hematuria 09/15/2011  . UTI (lower urinary tract infection) 09/15/2011  . Anemia 09/15/2011  . Constipation 09/15/2011  . Urinary retention 09/15/2011  . WEIGHT LOSS 07/27/2009  . HIP FRACTURE, RIGHT  01/22/2008  . HYPERTENSION, MILD 10/03/2007  . DEGENERATIVE JOINT DISEASE, KNEES, BILATERAL 10/03/2007  . HERNIORRHAPHY, HX OF 10/03/2007    CBC    Component Value Date/Time   WBC 6.5 04/29/2013 0450   RBC 3.00* 04/29/2013 0450   RBC 3.28* 04/28/2013 2115   HGB 9.0* 04/29/2013 0450   HCT 26.9* 04/29/2013 0450   PLT 182 04/29/2013 0450   MCV 89.7 04/29/2013 0450   LYMPHSABS 0.5* 04/28/2013 1845   MONOABS 0.4 04/28/2013 1845   EOSABS 0.0 04/28/2013 1845   BASOSABS 0.0 04/28/2013 1845    CMP     Component Value Date/Time   NA 131* 05/03/2013 0445   K 5.0 05/03/2013 0445   CL 100 05/03/2013 0445   CO2 24 05/03/2013 0445   GLUCOSE 86 05/03/2013 0445   BUN 17 05/03/2013 0445   CREATININE 1.16 05/03/2013 0445   CALCIUM 9.0 05/03/2013 0445   PROT 5.8* 05/03/2013 0445   ALBUMIN 2.3* 05/03/2013 0445   AST 15 05/03/2013 0445   ALT 6 05/03/2013 0445   ALKPHOS 46 05/03/2013 0445   BILITOT 0.3 05/03/2013 0445   GFRNONAA 52* 05/03/2013 0445   GFRAA 61* 05/03/2013 0445    Assessment and Plan  H/O total hip arthroplasty Left subcapital intertrochanteric fx;s/p surgery-was doing well with PT at hosp and OT/PT will continue here  Hematuria Gross with bladder outlet obstruction requiring foley catheter whhich will remain in place until urology visit 10/10  Protein-calorie malnutrition, severe Albumin was 3.1; pt was eating well today and has supplements ordered  HYPERTENSION, MILD On lasix and losartan 50 mg daily; his BP does not need to be any lower.    Margit Hanks, MD

## 2013-05-04 ENCOUNTER — Other Ambulatory Visit: Payer: Self-pay

## 2013-05-04 MED ORDER — TRAMADOL-ACETAMINOPHEN 37.5-325 MG PO TABS: 1.0000 | ORAL_TABLET | Freq: Four times a day (QID) | ORAL | Status: DC | PRN

## 2013-05-04 MED ORDER — HYDROCODONE-ACETAMINOPHEN 5-325 MG PO TABS: 1.0000 | ORAL_TABLET | Freq: Four times a day (QID) | ORAL | Status: DC | PRN

## 2013-05-04 NOTE — Addendum Note (Signed)
Addended by: Maurice Small on: 05/04/2013 09:58 AM   Modules accepted: Orders

## 2013-05-04 NOTE — Telephone Encounter (Signed)
Verified dose and instructions reflect manual request received by nursing home.   

## 2013-05-05 NOTE — Assessment & Plan Note (Signed)
Albumin was 3.1; pt was eating well today and has supplements ordered

## 2013-05-05 NOTE — Assessment & Plan Note (Signed)
On lasix and losartan 50 mg daily; his BP does not need to be any lower.

## 2013-05-05 NOTE — Assessment & Plan Note (Signed)
Gross with bladder outlet obstruction requiring foley catheter whhich will remain in place until urology visit 10/10

## 2013-05-05 NOTE — Assessment & Plan Note (Signed)
Left subcapital intertrochanteric fx;s/p surgery-was doing well with PT at hosp and OT/PT will continue here

## 2013-05-14 ENCOUNTER — Telehealth: Payer: Self-pay | Admitting: Internal Medicine

## 2013-05-14 NOTE — Telephone Encounter (Signed)
Called Niece Ms. Michelle Piper: he missed urology appointment 05/07/13. He is in SNF and they did not take him. Ms. Michelle Piper is instructed to call [facility] to reschedule and to let facility know - they are required to provide transportation to medical visits.  Called [facility] to update them.

## 2013-06-15 ENCOUNTER — Non-Acute Institutional Stay (SKILLED_NURSING_FACILITY): Payer: Medicare Other | Admitting: Nurse Practitioner

## 2013-06-15 DIAGNOSIS — Z96649 Presence of unspecified artificial hip joint: Secondary | ICD-10-CM

## 2013-06-15 DIAGNOSIS — E43 Unspecified severe protein-calorie malnutrition: Secondary | ICD-10-CM

## 2013-06-15 DIAGNOSIS — I10 Essential (primary) hypertension: Secondary | ICD-10-CM

## 2013-06-15 DIAGNOSIS — R319 Hematuria, unspecified: Secondary | ICD-10-CM

## 2013-06-15 DIAGNOSIS — K5900 Constipation, unspecified: Secondary | ICD-10-CM

## 2013-06-15 NOTE — Progress Notes (Signed)
Patient ID: [name] Bennett, male   DOB: Oct 26, 1917, 79 y.o.   MRN: [id number] Nursing Home Location:  [facility]   Place of Service: SNF (31)  PCP: [name], MD  No Known Allergies  Chief Complaint  Patient presents with  . Medical Managment of Chronic Issues    HPI:  Patient is 79 y.o. male who is currently at [facility] for STR s/p fall and L hip arthroplasty; pt with PMH of protein calorie-malnutrition, anemia, urinary retention (with foley catheter following with urology), constipation, hypertension who is being seen today for routine follow up.    Review of Systems:  Review of Systems  Constitutional: Negative for fever, chills and malaise/fatigue.  Respiratory: Negative for cough and shortness of breath.   Cardiovascular: Negative for chest pain and palpitations.  Gastrointestinal: Negative for heartburn, diarrhea and constipation.  Genitourinary: Negative for dysuria and urgency.  Musculoskeletal: Negative for joint pain and myalgias.  Skin: Negative.   Neurological: Negative for dizziness and headaches.  Psychiatric/Behavioral: Negative for depression.       Past Medical History  Diagnosis Date  . Hypertension   . Urinary retention   . H/O total hip arthroplasty 05/01/2013   Past Surgical History  Procedure Laterality Date  . Fracture surgery    . Hip surgery    . Hip arthroplasty Left 05/01/2013    Procedure: ARTHROPLASTY BIPOLAR HIP;  Surgeon: Shelda Pal, MD;  Location: [facility];  Service: Orthopedics;  Laterality: Left;   Social History:   reports that he has never smoked. He does not have any smokeless tobacco history on file. He reports that he does not drink alcohol or use illicit drugs.  No family history on file.  Medications: Patient's Medications  New Prescriptions   No medications on file  Previous Medications   ACETAMINOPHEN (TYLENOL) 325 MG TABLET    Take 2 tablets (650 mg total) by mouth every 6 (six) hours  as needed.   FEEDING SUPPLEMENT (ENSURE COMPLETE) LIQD    Take 237 mLs by mouth 3 (three) times daily with meals.   FERROUS SULFATE 325 (65 FE) MG TABLET    Take 1 tablet (325 mg total) by mouth 2 (two) times daily.   HYDROCODONE-ACETAMINOPHEN (NORCO/VICODIN) 5-325 MG PER TABLET    Take 1-2 tablets by mouth every 6 (six) hours as needed.   LOSARTAN (COZAAR) 50 MG TABLET    Take 1 tablet (50 mg total) by mouth daily.   MAGNESIUM CITRATE SOLN    Take 296 mLs (1 Bottle total) by mouth daily as needed.   POLYETHYLENE GLYCOL (MIRALAX / GLYCOLAX) PACKET    Take 17 g by mouth daily.   TRAMADOL-ACETAMINOPHEN (ULTRACET) 37.5-325 MG PER TABLET    Take 1-2 tablets by mouth every 6 (six) hours as needed for pain. 1-2 by mouth every 6 hours as needed for pain **DO NOT EXCEED 3 GM APAP/24HR**  Modified Medications   No medications on file  Discontinued Medications   FUROSEMIDE (LASIX) 40 MG TABLET    Take 1 tablet (40 mg total) by mouth daily.   SULFAMETHOXAZOLE-TRIMETHOPRIM (BACTRIM DS) 800-160 MG PER TABLET    Take 1 tablet by mouth every 12 (twelve) hours.     Physical Exam: Physical Exam  Constitutional: He is well-developed, well-nourished, and in no distress. No distress.  HENT:  Mouth/Throat: Oropharynx is clear and moist. No oropharyngeal exudate.  Cardiovascular: Normal rate, regular rhythm and normal heart sounds.   Pulmonary/Chest:  Effort normal and breath sounds normal.  Abdominal: Soft. Bowel sounds are normal. He exhibits no distension.  Musculoskeletal: He exhibits no edema and no tenderness.  Neurological: He is alert.  Skin: Skin is warm and dry. He is not diaphoretic.  Psychiatric: Affect normal.    Filed Vitals:   06/15/13 1426  BP: 140/80  Pulse: 84  Temp: 98.6 F (37 C)  Resp: 20  Weight: 125 lb (56.7 kg)      Labs reviewed: Basic Metabolic Panel:  Recent Labs  16/10/96 0450 04/29/13 1736 05/03/13 0445  NA 135 134* 131*  K 4.0 4.1 5.0  CL 101 102 100  CO2  25 23 24   GLUCOSE 101* 86 86  BUN 26* 21 17  CREATININE 1.07 0.96 1.16  CALCIUM 9.1 9.4 9.0   Liver Function Tests:  Recent Labs  04/28/13 1845 04/29/13 1736 05/03/13 0445  AST 21 16 15   ALT 11 9 6   ALKPHOS 59 50 46  BILITOT 0.6 0.8 0.3  PROT 7.6 6.5 5.8*  ALBUMIN 3.9 3.1* 2.3*   No results found for this basename: LIPASE, AMYLASE,  in the last 8760 hours No results found for this basename: AMMONIA,  in the last 8760 hours CBC:  Recent Labs  04/28/13 1845 04/28/13 1900 04/29/13 0450  WBC 8.7  --  6.5  NEUTROABS 7.8*  --   --   HGB 10.0* 10.9* 9.0*  HCT 30.7* 32.0* 26.9*  MCV 90.0  --  89.7  PLT 185  --  182   Cardiac Enzymes:  Recent Labs  04/28/13 1845 04/28/13 2115  CKTOTAL  --  297*  TROPONINI <0.30  --    BNP: No components found with this basename: POCBNP,  CBG:  Recent Labs  04/28/13 1858  GLUCAP 69*  CBC NO Diff (Complete Blood Count)    Result: 05/12/2013 11:38 AM   ( Status: F )     C WBC 6.3     4.0-10.5 K/uL SLN   RBC 2.56   L 3.87-5.11 MIL/uL SLN   Hemoglobin 7.7   L 12.0-15.0 g/dL SLN   Hematocrit 04.5   L 36.0-46.0 % SLN   MCV 86.7     78.0-100.0 fL SLN   MCH 30.1     26.0-34.0 pg SLN   MCHC 34.7     30.0-36.0 g/dL SLN   RDW 40.9     81.1-91.4 % SLN   Platelet Count 453   H 150-400 K/uL SLN   Basic Metabolic Panel    Result: 05/12/2013 12:21 PM   ( Status: F )       Sodium 131   L 135-145 mEq/L SLN   Potassium 5.0     3.5-5.3 mEq/L SLN   Chloride 100     96-112 mEq/L SLN   CO2 23     19-32 mEq/L SLN   Glucose 85     70-99 mg/dL SLN   BUN 39   H 7-82 mg/dL SLN   Creatinine 9.56   H 0.50-1.10 mg/dL SLN   Calcium 9.0     CBC NO Diff (Complete Blood Count)    Result: 05/17/2013 10:05 AM   ( Status: F )       WBC 6.3     4.0-10.5 K/uL WML   RBC 2.39   L 4.22-5.81 MIL/uL WML   Hemoglobin 7.1   L 13.0-17.0 g/dL WML   Hematocrit 21.3   L 39.0-52.0 % WML   MCV 90.8  78.0-100.0 fL WML   MCH 29.7     26.0-34.0 pg WML   MCHC 32.5      30.0-36.0 g/dL WML   RDW 16.1     09.6-04.5 % WML   Platelet Count 370     150-400 K/uL WML   Basic Metabolic Panel    Result: 05/17/2013 10:05 AM   ( Status: F )       Sodium 133   L 135-145 mEq/L WML   Potassium 4.8     3.5-5.3 mEq/L WML   Chloride 103     96-112 mEq/L WML   CO2 25     19-32 mEq/L WML   Glucose 88     70-99 mg/dL WML   BUN 29   H 4-09 mg/dL WML   Creatinine 8.11     0.50-1.35 mg/dL WML   Calcium 9.6  Assessment/Plan 1. Unspecified constipation -having to use facility protocol frequently due to worsening constipation -will add miralax 17gm daily -encouraged hydration  2. HYPERTENSION, MILD -Patient is stable; continue current regimen. Will monitor and make changes as necessary. -follow up bmp  3. H/O total hip arthroplasty - no pain; conts to work with therapy  4. Hematuria Resolved; following with urology due to obstruction   5. Protein-calorie malnutrition, severe -reports food is not prepared like he likes it -good appetite per pt and depending of the food depends on if he eats it all -supplements per RD -will have RD talk to pt about food choices to increase intake   6 . Anemia no hemoccults have been reported Will reorder hemoccults and follow up cbc

## 2013-07-12 ENCOUNTER — Encounter: Payer: Self-pay | Admitting: Internal Medicine

## 2013-07-12 ENCOUNTER — Non-Acute Institutional Stay (SKILLED_NURSING_FACILITY): Payer: Medicare Other | Admitting: Internal Medicine

## 2013-07-12 DIAGNOSIS — Z9889 Other specified postprocedural states: Secondary | ICD-10-CM

## 2013-07-12 DIAGNOSIS — D649 Anemia, unspecified: Secondary | ICD-10-CM

## 2013-07-12 DIAGNOSIS — N390 Urinary tract infection, site not specified: Secondary | ICD-10-CM

## 2013-07-12 DIAGNOSIS — E43 Unspecified severe protein-calorie malnutrition: Secondary | ICD-10-CM

## 2013-07-12 DIAGNOSIS — Z96649 Presence of unspecified artificial hip joint: Secondary | ICD-10-CM

## 2013-07-12 DIAGNOSIS — Z978 Presence of other specified devices: Secondary | ICD-10-CM

## 2013-07-12 DIAGNOSIS — I10 Essential (primary) hypertension: Secondary | ICD-10-CM

## 2013-07-12 DIAGNOSIS — R339 Retention of urine, unspecified: Secondary | ICD-10-CM

## 2013-07-12 NOTE — Assessment & Plan Note (Signed)
Still in therapy-doing well

## 2013-07-12 NOTE — Assessment & Plan Note (Signed)
11/19  H/H 8.5/26.8 - will recheck tomorrow am lab draw

## 2013-07-12 NOTE — Assessment & Plan Note (Signed)
Pt has a UTI again-grew out E.Coli sensitive to everything;ampiccilin 500 mg TID started 12/14 to run 7 days

## 2013-07-12 NOTE — Assessment & Plan Note (Signed)
Pt has indwelling foley and current UTI

## 2013-07-12 NOTE — Assessment & Plan Note (Signed)
Pt is eating pretty well, doing better

## 2013-07-12 NOTE — Progress Notes (Signed)
MRN: [id number] Name: [name] Bennett  Sex: male Age: 79 y.o. DOB: [birth year]  PSC #: [name] Facility/Room: 114A Level Of Care: SNF Provider: Merrilee Seashore D Emergency Contacts: Extended Emergency Contact Information Primary Emergency Contact: Beaulah Dinning [place] Home Phone: [phone] Relation: Niece Secondary Emergency Contact: Strege,Beverly  [place] Home Phone: [phone] Relation: Niece  Code Status: FULL  Allergies: Review of patient's allergies indicates no known allergies.  Chief Complaint  Patient presents with  . Medical Managment of Chronic Issues    HPI: Patient is 79 y.o. male who has an acute UTI and who is being evaluated for chronic [problems as well.  Past Medical History  Diagnosis Date  . Hypertension   . Urinary retention   . H/O total hip arthroplasty 05/01/2013    Past Surgical History  Procedure Laterality Date  . Fracture surgery    . Hip surgery    . Hip arthroplasty Left 05/01/2013    Procedure: ARTHROPLASTY BIPOLAR HIP;  Surgeon: Shelda Pal, MD;  Location: [facility];  Service: Orthopedics;  Laterality: Left;      Medication List       This list is accurate as of: 07/12/13  5:34 PM.  Always use your most recent med list.               acetaminophen 325 MG tablet  Commonly known as:  TYLENOL  Take 2 tablets (650 mg total) by mouth every 6 (six) hours as needed.     ampicillin 500 MG capsule  Commonly known as:  PRINCIPEN  Take 500 mg by mouth 3 (three) times daily.     feeding supplement (ENSURE COMPLETE) Liqd  Take 237 mLs by mouth 3 (three) times daily with meals.     ferrous sulfate 325 (65 FE) MG tablet  Take 1 tablet (325 mg total) by mouth 2 (two) times daily.     HYDROcodone-acetaminophen 5-325 MG per tablet  Commonly known as:  NORCO/VICODIN  Take 1-2 tablets by mouth every 6 (six) hours as needed.     losartan 50 MG tablet  Commonly known as:  COZAAR  Take 1 tablet (50  mg total) by mouth daily.     magnesium citrate Soln  Take 296 mLs (1 Bottle total) by mouth daily as needed.     polyethylene glycol packet  Commonly known as:  MIRALAX / GLYCOLAX  Take 17 g by mouth daily.     traMADol-acetaminophen 37.5-325 MG per tablet  Commonly known as:  ULTRACET  Take 1-2 tablets by mouth every 6 (six) hours as needed for pain. 1-2 by mouth every 6 hours as needed for pain **DO NOT EXCEED 3 GM APAP/24HR**        Meds ordered this encounter  Medications  . ampicillin (PRINCIPEN) 500 MG capsule    Sig: Take 500 mg by mouth 3 (three) times daily.     There is no immunization history on file for this patient.  History  Substance Use Topics  . Smoking status: Never Smoker   . Smokeless tobacco: Not on file  . Alcohol Use: No    Review of Systems  DATA OBTAINED: from patient; FEELS REALLY WELL, NEVER FELT SICK GENERAL: Feels well no fevers, fatigue, appetite changes SKIN: No itching, rash HEENT: No complaint RESPIRATORY: No cough, wheezing, SOB CARDIAC: No chest pain, palpitations, lower extremity edema  GI: No abdominal pain, No N/V/D or constipation, No heartburn or reflux  GU: No dysuria, frequency  or urgency, or incontinence  MUSCULOSKELETAL: No unrelieved bone/joint pain NEUROLOGIC: No headache, dizziness or focal weakness PSYCHIATRIC: No overt anxiety or sadness. Sleeps well.   Filed Vitals:   07/12/13 1720  BP: 129/74  Pulse: 90  Temp: 100.5 F (38.1 C)  Resp: 20    Physical Exam  GENERAL APPEARANCE: Alert, conversant. Appropriately groomed. No acute distress, DOING WORD PUZZLES SKIN: No diaphoresis rash HEENT: Unremarkable RESPIRATORY: Breathing is even, unlabored. Lung sounds are clear   CARDIOVASCULAR: Heart RRR no murmurs, rubs or gallops. No peripheral edema  GASTROINTESTINAL: Abdomen is soft, non-tender, not distended w/ normal bowel sounds.  GENITOURINARY: Bladder non tender, not distended ; INDWELLING  FOLEY MUSCULOSKELETAL: No abnormal joints or musculature NEUROLOGIC: Cranial nerves 2-12 grossly intact. Moves all extremities no tremor. PSYCHIATRIC: Mood and affect appropriate to situation, no behavioral issues  Patient Active Problem List   Diagnosis Date Noted  . Chronic indwelling Foley catheter 07/12/2013  . H/O total hip arthroplasty   . Protein-calorie malnutrition, severe 04/30/2013  . Weakness generalized 04/28/2013  . Left leg pain 04/28/2013  . Hematuria 09/15/2011  . UTI (lower urinary tract infection) 09/15/2011  . Anemia 09/15/2011  . Unspecified constipation 09/15/2011  . Urinary retention 09/15/2011  . WEIGHT LOSS 07/27/2009  . HIP FRACTURE, RIGHT 01/22/2008  . HYPERTENSION, MILD 10/03/2007  . DEGENERATIVE JOINT DISEASE, KNEES, BILATERAL 10/03/2007  . HERNIORRHAPHY, HX OF 10/03/2007    CBC    Component Value Date/Time   WBC 6.5 04/29/2013 0450   RBC 3.00* 04/29/2013 0450   RBC 3.28* 04/28/2013 2115   HGB 9.0* 04/29/2013 0450   HCT 26.9* 04/29/2013 0450   PLT 182 04/29/2013 0450   MCV 89.7 04/29/2013 0450   LYMPHSABS 0.5* 04/28/2013 1845   MONOABS 0.4 04/28/2013 1845   EOSABS 0.0 04/28/2013 1845   BASOSABS 0.0 04/28/2013 1845    CMP     Component Value Date/Time   NA 131* 05/03/2013 0445   K 5.0 05/03/2013 0445   CL 100 05/03/2013 0445   CO2 24 05/03/2013 0445   GLUCOSE 86 05/03/2013 0445   BUN 17 05/03/2013 0445   CREATININE 1.16 05/03/2013 0445   CALCIUM 9.0 05/03/2013 0445   PROT 5.8* 05/03/2013 0445   ALBUMIN 2.3* 05/03/2013 0445   AST 15 05/03/2013 0445   ALT 6 05/03/2013 0445   ALKPHOS 46 05/03/2013 0445   BILITOT 0.3 05/03/2013 0445   GFRNONAA 52* 05/03/2013 0445   GFRAA 61* 05/03/2013 0445    Assessment and Plan  UTI (lower urinary tract infection) Pt has a UTI again-grew out E.Coli sensitive to everything;ampiccilin 500 mg TID started 12/14 to run 7 days  HYPERTENSION, MILD Controlled on Lasix and Losartan-will continue  H/O total hip  arthroplasty Still in therapy-doing well  Protein-calorie malnutrition, severe Pt is eating pretty well, doing better  Anemia 11/19  H/H 8.5/26.8 - will recheck tomorrow am lab draw  Urinary retention Pt has indwelling foley and current UTI    Margit Hanks, MD

## 2013-07-12 NOTE — Assessment & Plan Note (Signed)
Controlled on Lasix and Losartan-will continue

## 2013-07-13 ENCOUNTER — Encounter: Payer: Self-pay | Admitting: Nurse Practitioner

## 2013-07-13 NOTE — Progress Notes (Signed)
This encounter was created in error - please disregard.

## 2013-08-17 ENCOUNTER — Encounter: Payer: Self-pay | Admitting: Nurse Practitioner

## 2013-08-17 ENCOUNTER — Non-Acute Institutional Stay (SKILLED_NURSING_FACILITY): Payer: Medicare Other | Admitting: Nurse Practitioner

## 2013-08-17 DIAGNOSIS — Z978 Presence of other specified devices: Secondary | ICD-10-CM

## 2013-08-17 DIAGNOSIS — I10 Essential (primary) hypertension: Secondary | ICD-10-CM

## 2013-08-17 DIAGNOSIS — R339 Retention of urine, unspecified: Secondary | ICD-10-CM

## 2013-08-17 DIAGNOSIS — E43 Unspecified severe protein-calorie malnutrition: Secondary | ICD-10-CM

## 2013-08-17 DIAGNOSIS — Z9889 Other specified postprocedural states: Secondary | ICD-10-CM

## 2013-08-17 DIAGNOSIS — K5900 Constipation, unspecified: Secondary | ICD-10-CM

## 2013-08-17 DIAGNOSIS — Z960 Presence of urogenital implants: Secondary | ICD-10-CM

## 2013-08-17 NOTE — Progress Notes (Signed)
Patient ID: [name] Bennett, male   DOB: [birth year], 80 y.o.   MRN: [id number]    Nursing Home Location:  [facility]   Place of Service: SNF (31)  PCP: [name], MD  No Known Allergies  Chief Complaint  Patient presents with  . Medical Managment of Chronic Issues    HPI:  Patient is 80 y.o. male s/p fall and L hip arthroplasty; pt with PMH of protein calorie-malnutrition, anemia, urinary retention (with foley catheter following with urology), constipation, hypertension who is being seen today for routine follow up on chronic conditions. Pt does not have any complaints today and staff without concerns at this time     Physical Exam  Constitutional: He is well-developed, well-nourished, and in no distress. No distress.  HENT:  Mouth/Throat: Oropharynx is clear and moist. No oropharyngeal exudate.  Cardiovascular: Normal rate, regular rhythm and normal heart sounds.   Pulmonary/Chest: Effort normal and breath sounds normal.  Abdominal: Soft. Bowel sounds are normal. He exhibits no distension.  Foley intact  Musculoskeletal: He exhibits no edema and no tenderness.  Neurological: He is alert.  Skin: Skin is warm and dry. He is not diaphoretic.  Psychiatric: Affect normal.     Review of Systems:  Review of Systems  Constitutional: Negative for fever, chills and malaise/fatigue.  Respiratory: Negative for cough and shortness of breath.   Cardiovascular: Negative for chest pain and palpitations.  Gastrointestinal: Negative for heartburn, diarrhea and constipation.  Genitourinary: Negative for dysuria and urgency.  Musculoskeletal: Negative for joint pain and myalgias.  Skin: Negative.   Neurological: Negative for dizziness and headaches.  Psychiatric/Behavioral: Negative for depression.     Past Medical History  Diagnosis Date  . Hypertension   . Urinary retention   . H/O total hip arthroplasty 05/01/2013   Past Surgical History  Procedure  Laterality Date  . Fracture surgery    . Hip surgery    . Hip arthroplasty Left 05/01/2013    Procedure: ARTHROPLASTY BIPOLAR HIP;  Surgeon: Shelda Pal, MD;  Location: [facility];  Service: Orthopedics;  Laterality: Left;   Social History:   reports that he has never smoked. He does not have any smokeless tobacco history on file. He reports that he does not drink alcohol or use illicit drugs.  No family history on file.  Medications: Patient's Medications  New Prescriptions   No medications on file  Previous Medications   ACETAMINOPHEN (TYLENOL) 325 MG TABLET    Take 2 tablets (650 mg total) by mouth every 6 (six) hours as needed.   FEEDING SUPPLEMENT (ENSURE COMPLETE) LIQD    Take 237 mLs by mouth 3 (three) times daily with meals.   FERROUS SULFATE 325 (65 FE) MG TABLET    Take 1 tablet (325 mg total) by mouth 2 (two) times daily.   HYDROCODONE-ACETAMINOPHEN (NORCO/VICODIN) 5-325 MG PER TABLET    Take 1-2 tablets by mouth every 6 (six) hours as needed.   LOSARTAN (COZAAR) 50 MG TABLET    Take 1 tablet (50 mg total) by mouth daily.   MAGNESIUM CITRATE SOLN    Take 296 mLs (1 Bottle total) by mouth daily as needed.   POLYETHYLENE GLYCOL (MIRALAX / GLYCOLAX) PACKET    Take 17 g by mouth daily.   TRAMADOL-ACETAMINOPHEN (ULTRACET) 37.5-325 MG PER TABLET    Take 1-2 tablets by mouth every 6 (six) hours as needed for pain. 1-2 by mouth every 6 hours as needed for pain **DO NOT EXCEED  3 GM APAP/24HR**  Modified Medications   No medications on file  Discontinued Medications   No medications on file     Filed Vitals:   08/17/13 1724  BP: 118/70  Pulse: 72  Temp: 98.7 F (37.1 C)  Resp: 20      Labs reviewed: Basic Metabolic Panel:  Recent Labs  04/54/908/09/11 0450 04/29/13 1736 05/03/13 0445  NA 135 134* 131*  K 4.0 4.1 5.0  CL 101 102 100  CO2 25 23 24   GLUCOSE 101* 86 86  BUN 26* 21 17  CREATININE 1.07 0.96 1.16  CALCIUM 9.1 9.4 9.0   Liver Function Tests:  Recent  Labs  04/28/13 1845 04/29/13 1736 05/03/13 0445  AST 21 16 15   ALT 11 9 6   ALKPHOS 59 50 46  BILITOT 0.6 0.8 0.3  PROT 7.6 6.5 5.8*  ALBUMIN 3.9 3.1* 2.3*   No results found for this basename: LIPASE, AMYLASE,  in the last 8760 hours No results found for this basename: AMMONIA,  in the last 8760 hours CBC:  Recent Labs  04/28/13 1845 04/28/13 1900 04/29/13 0450  WBC 8.7  --  6.5  NEUTROABS 7.8*  --   --   HGB 10.0* 10.9* 9.0*  HCT 30.7* 32.0* 26.9*  MCV 90.0  --  89.7  PLT 185  --  182   Cardiac Enzymes:  Recent Labs  04/28/13 1845 04/28/13 2115  CKTOTAL  --  297*  TROPONINI <0.30  --    CBC NO Diff (Complete Blood Count)    Result: 07/13/2013 3:21 PM   ( Status: F )       WBC 4.4     4.0-10.5 K/uL SLN   RBC 3.38   L 4.22-5.81 MIL/uL SLN   Hemoglobin 9.8   L 13.0-17.0 g/dL SLN   Hematocrit 81.129.3   L 39.0-52.0 % SLN   MCV 86.7     78.0-100.0 fL SLN   MCH 29.0     26.0-34.0 pg SLN   MCHC 33.4     30.0-36.0 g/dL SLN   RDW 91.415.2     78.2-95.611.5-15.5 % SLN   Platelet Count 252     150-400 K/uL SLN   Basic Metabolic Panel    Result: 07/13/2013 3:51 PM   ( Status: F )       Sodium 137     135-145 mEq/L SLN   Potassium 4.8     3.5-5.3 mEq/L SLN   Chloride 105     96-112 mEq/L SLN   CO2 25     19-32 mEq/L SLN   Glucose 76     70-99 mg/dL SLN   BUN 28   H 2-136-23 mg/dL SLN   Creatinine 0.861.06     0.50-1.35 mg/dL SLN   Calcium 9.3 Assessment/Plan 1. Unspecified constipation -well controlled on current medications  2. HYPERTENSION, MILD -Patient is stable; continue current regimen. Will monitor and make changes as necessary.  3. Protein-calorie malnutrition, severe -weight of 130 lbs; conts supplements  4. Chronic indwelling Foley catheter -staff maintain proper catheter care; changing monthly; no signs of infection  5. Urinary retention -conts with chronic indwelling foley catheter  6. Anemia conts on iron

## 2013-09-23 ENCOUNTER — Encounter: Payer: Self-pay | Admitting: Internal Medicine

## 2013-09-23 ENCOUNTER — Non-Acute Institutional Stay (SKILLED_NURSING_FACILITY): Payer: Medicare Other | Admitting: Internal Medicine

## 2013-09-23 DIAGNOSIS — R319 Hematuria, unspecified: Secondary | ICD-10-CM

## 2013-09-23 NOTE — Progress Notes (Signed)
MRN: [id number] Name: [name] Bennett  Sex: male Age: 80 y.o. DOB: 29-Mar-1918  [facility] Facility/Room: 222B Level Of Care: SNF Provider: Merrilee SeashoreALEXANDER, ANNE D Emergency Contacts: Extended Emergency Contact Information Primary Emergency Contact: Beaulah DinningGuy,[place]  [place] Home Phone: [phone] Relation: Niece Secondary Emergency Contact: Acri,Beverly  [place] Home Phone: [phone] Relation: Niece     Allergies: Review of patient's allergies indicates no known allergies.  Chief Complaint  Patient presents with  . Acute Visit    HPI: Patient is 80 y.o. male who is peeing blood.  Past Medical History  Diagnosis Date  . Hypertension   . Urinary retention   . H/O total hip arthroplasty 05/01/2013    Past Surgical History  Procedure Laterality Date  . Fracture surgery    . Hip surgery    . Hip arthroplasty Left 05/01/2013    Procedure: ARTHROPLASTY BIPOLAR HIP;  Surgeon: Shelda PalMatthew D Olin, MD;  Location: [facility];  Service: Orthopedics;  Laterality: Left;      Medication List       This list is accurate as of: 09/23/13 12:35 PM.  Always use your most recent med list.               acetaminophen 325 MG tablet  Commonly known as:  TYLENOL  Take 2 tablets (650 mg total) by mouth every 6 (six) hours as needed.     feeding supplement (ENSURE COMPLETE) Liqd  Take 237 mLs by mouth 3 (three) times daily with meals.     ferrous sulfate 325 (65 FE) MG tablet  Take 1 tablet (325 mg total) by mouth 2 (two) times daily.     HYDROcodone-acetaminophen 5-325 MG per tablet  Commonly known as:  NORCO/VICODIN  Take 1-2 tablets by mouth every 6 (six) hours as needed.     losartan 50 MG tablet  Commonly known as:  COZAAR  Take 1 tablet (50 mg total) by mouth daily.     magnesium citrate Soln  Take 296 mLs (1 Bottle total) by mouth daily as needed.     polyethylene glycol packet  Commonly known as:  MIRALAX / GLYCOLAX  Take 17 g by mouth  daily.     traMADol-acetaminophen 37.5-325 MG per tablet  Commonly known as:  ULTRACET  Take 1-2 tablets by mouth every 6 (six) hours as needed for pain. 1-2 by mouth every 6 hours as needed for pain **DO NOT EXCEED 3 GM APAP/24HR**        No orders of the defined types were placed in this encounter.     There is no immunization history on file for this patient.  History  Substance Use Topics  . Smoking status: Never Smoker   . Smokeless tobacco: Not on file  . Alcohol Use: No    Review of Systems  DATA OBTAINED: from patient, nurse GENERAL: Feels well no fevers, fatigue, appetite changes SKIN: No itching, rash HEENT: No complaint RESPIRATORY: No cough, wheezing, SOB CARDIAC: No chest pain, palpitations, lower extremity edema  GI: No abdominal pain, No N/V/D or constipation, No heartburn or reflux  GU: + dysuria, frequency  Urgency; + blood clots;no CVA tender or abd pain MUSCULOSKELETAL: No unrelieved bone/joint pain NEUROLOGIC: No headache, dizziness or focal weakness PSYCHIATRIC: No overt anxiety or sadness. Sleeps well.   Filed Vitals:   09/23/13 1225  BP: 120/86  Pulse: 60  Temp: 97.5 F (36.4 C)  Resp: 17    Physical Exam  GENERAL  APPEARANCE: Alert, conversant. Appropriately groomed. No acute distress Pt has large blood clots in toilet SKIN: No diaphoresis rash HEENT: Unremarkable RESPIRATORY: Breathing is even, unlabored. Lung sounds are clear   CARDIOVASCULAR: Heart RRR no murmurs, rubs or gallops. No peripheral edema  GASTROINTESTINAL: Abdomen is soft, non-tender, not distended w/ normal bowel sounds.  GENITOURINARY: Bladder feels distended;noi CVA tender MUSCULOSKELETAL: No abnormal joints or musculature NEUROLOGIC: Cranial nerves 2-12 grossly intact.WC PSYCHIATRIC: Mood and affect appropriate to situation, no behavioral issues  Patient Active Problem List   Diagnosis Date Noted  . Chronic indwelling Foley catheter 07/12/2013  . H/O total hip  arthroplasty   . Protein-calorie malnutrition, severe 04/30/2013  . Weakness generalized 04/28/2013  . Left leg pain 04/28/2013  . Hematuria 09/15/2011  . UTI (lower urinary tract infection) 09/15/2011  . Anemia 09/15/2011  . Unspecified constipation 09/15/2011  . Urinary retention 09/15/2011  . WEIGHT LOSS 07/27/2009  . HIP FRACTURE, RIGHT 01/22/2008  . HYPERTENSION, MILD 10/03/2007  . DEGENERATIVE JOINT DISEASE, KNEES, BILATERAL 10/03/2007  . HERNIORRHAPHY, HX OF 10/03/2007    CBC    Component Value Date/Time   WBC 6.5 04/29/2013 0450   RBC 3.00* 04/29/2013 0450   RBC 3.28* 04/28/2013 2115   HGB 9.0* 04/29/2013 0450   HCT 26.9* 04/29/2013 0450   PLT 182 04/29/2013 0450   MCV 89.7 04/29/2013 0450   LYMPHSABS 0.5* 04/28/2013 1845   MONOABS 0.4 04/28/2013 1845   EOSABS 0.0 04/28/2013 1845   BASOSABS 0.0 04/28/2013 1845    CMP     Component Value Date/Time   NA 131* 05/03/2013 0445   K 5.0 05/03/2013 0445   CL 100 05/03/2013 0445   CO2 24 05/03/2013 0445   GLUCOSE 86 05/03/2013 0445   BUN 17 05/03/2013 0445   CREATININE 1.16 05/03/2013 0445   CALCIUM 9.0 05/03/2013 0445   PROT 5.8* 05/03/2013 0445   ALBUMIN 2.3* 05/03/2013 0445   AST 15 05/03/2013 0445   ALT 6 05/03/2013 0445   ALKPHOS 46 05/03/2013 0445   BILITOT 0.3 05/03/2013 0445   GFRNONAA 52* 05/03/2013 0445   GFRAA 61* 05/03/2013 0445    Assessment and Plan  Hematuria Pt is passing blood clots which he has done before and having urgency. He is fine with a foley catheter, in fact wants one. Will leave in until bleeding clears  or can send pt to urology next week. U/A with C&S    Margit Hanks, MD

## 2013-09-23 NOTE — Assessment & Plan Note (Signed)
Pt is passing blood clots which he has done before and having urgency. He is fine with a foley catheter, in fact wants one. Will leave in until bleeding clears  or can send pt to urology next week. U/A with C&S

## 2013-09-24 ENCOUNTER — Non-Acute Institutional Stay (SKILLED_NURSING_FACILITY): Payer: Medicare Other | Admitting: Nurse Practitioner

## 2013-09-24 DIAGNOSIS — R634 Abnormal weight loss: Secondary | ICD-10-CM

## 2013-09-24 DIAGNOSIS — K5900 Constipation, unspecified: Secondary | ICD-10-CM

## 2013-09-24 DIAGNOSIS — D649 Anemia, unspecified: Secondary | ICD-10-CM

## 2013-09-24 DIAGNOSIS — I10 Essential (primary) hypertension: Secondary | ICD-10-CM

## 2013-09-24 DIAGNOSIS — R339 Retention of urine, unspecified: Secondary | ICD-10-CM

## 2013-09-24 DIAGNOSIS — R319 Hematuria, unspecified: Secondary | ICD-10-CM

## 2013-09-24 NOTE — Progress Notes (Signed)
Patient ID: [name] Bennett, male   DOB: Jul 07, 1918, 80 y.o.   MRN: [id number]    Nursing Home Location:  [facility]   Place of Service: SNF (31)  PCP: [name], MD  No Known Allergies  Chief Complaint  Patient presents with  . Medical Managment of Chronic Issues    HPI:  Patient is 80 y.o. male s/p fall and L hip arthroplasty; pt with PMH of protein calorie-malnutrition, anemia, urinary retention, constipation, hypertension who is being seen today for routine follow up on chronic conditions. Pt does not have any complaints today and staff without concerns at this time. In the last month pt with frequent UTIs, pt was self catheterizing himself and found to have blood in his urine, question if this was due to traumatic cath; now with indwelling catheter and increase in fluid, currently no blood in urine.   Review of Systems:  Review of Systems  Constitutional: Negative for fever, chills and malaise/fatigue.  Respiratory: Negative for cough and shortness of breath.   Cardiovascular: Negative for chest pain and palpitations.  Gastrointestinal: Negative for heartburn, abdominal pain, diarrhea and constipation.  Genitourinary: Negative for dysuria, urgency and frequency.  Musculoskeletal: Negative for joint pain and myalgias.  Skin: Negative.   Neurological: Negative for dizziness, seizures and headaches.  Psychiatric/Behavioral: Negative for depression. The patient is not nervous/anxious.      Past Medical History  Diagnosis Date  . Hypertension   . Urinary retention   . H/O total hip arthroplasty 05/01/2013   Past Surgical History  Procedure Laterality Date  . Fracture surgery    . Hip surgery    . Hip arthroplasty Left 05/01/2013    Procedure: ARTHROPLASTY BIPOLAR HIP;  Surgeon: Shelda Pal, MD;  Location: [facility];  Service: Orthopedics;  Laterality: Left;   Social History:   reports that he has never smoked. He does not have any smokeless tobacco  history on file. He reports that he does not drink alcohol or use illicit drugs.  No family history on file.  Medications: Patient's Medications  New Prescriptions   No medications on file  Previous Medications   ACETAMINOPHEN (TYLENOL) 325 MG TABLET    Take 2 tablets (650 mg total) by mouth every 6 (six) hours as needed.   FEEDING SUPPLEMENT (ENSURE COMPLETE) LIQD    Take 237 mLs by mouth 3 (three) times daily with meals.   FERROUS SULFATE 325 (65 FE) MG TABLET    Take 1 tablet (325 mg total) by mouth 2 (two) times daily.   HYDROCODONE-ACETAMINOPHEN (NORCO/VICODIN) 5-325 MG PER TABLET    Take 1-2 tablets by mouth every 6 (six) hours as needed.   LOSARTAN (COZAAR) 50 MG TABLET    Take 1 tablet (50 mg total) by mouth daily.   MAGNESIUM CITRATE SOLN    Take 296 mLs (1 Bottle total) by mouth daily as needed.   POLYETHYLENE GLYCOL (MIRALAX / GLYCOLAX) PACKET    Take 17 g by mouth daily.   TRAMADOL-ACETAMINOPHEN (ULTRACET) 37.5-325 MG PER TABLET    Take 1-2 tablets by mouth every 6 (six) hours as needed for pain. 1-2 by mouth every 6 hours as needed for pain **DO NOT EXCEED 3 GM APAP/24HR**  Modified Medications   No medications on file  Discontinued Medications   No medications on file     Physical Exam:  Filed Vitals:   09/24/13 1434  BP: 116/60  Pulse: 80  Temp: 97 F (36.1 C)  Resp:  16  Weight: 124 lb 9.6 oz (56.518 kg)   Physical Exam  Constitutional: He is oriented to person, place, and time and well-developed, well-nourished, and in no distress.  HENT:  Mouth/Throat: Oropharynx is clear and moist. No oropharyngeal exudate.  Neck: Normal range of motion. Neck supple.  Cardiovascular: Normal rate, regular rhythm and normal heart sounds.   Pulmonary/Chest: Effort normal and breath sounds normal. No respiratory distress.  Abdominal: Soft. Bowel sounds are normal. He exhibits no distension.  Genitourinary:  Foley catheter with dark urine  Musculoskeletal: Normal range of  motion. He exhibits no edema and no tenderness.  Neurological: He is alert and oriented to person, place, and time.  Skin: Skin is warm and dry. No erythema.  Psychiatric: Affect normal.      Labs reviewed: Basic Metabolic Panel:  Recent Labs  16/04/9609/02/14 0450 04/29/13 1736 05/03/13 0445  NA 135 134* 131*  K 4.0 4.1 5.0  CL 101 102 100  CO2 25 23 24   GLUCOSE 101* 86 86  BUN 26* 21 17  CREATININE 1.07 0.96 1.16  CALCIUM 9.1 9.4 9.0   Liver Function Tests:  Recent Labs  04/28/13 1845 04/29/13 1736 05/03/13 0445  AST 21 16 15   ALT 11 9 6   ALKPHOS 59 50 46  BILITOT 0.6 0.8 0.3  PROT 7.6 6.5 5.8*  ALBUMIN 3.9 3.1* 2.3*   No results found for this basename: LIPASE, AMYLASE,  in the last 8760 hours No results found for this basename: AMMONIA,  in the last 8760 hours CBC:  Recent Labs  04/28/13 1845 04/28/13 1900 04/29/13 0450  WBC 8.7  --  6.5  NEUTROABS 7.8*  --   --   HGB 10.0* 10.9* 9.0*  HCT 30.7* 32.0* 26.9*  MCV 90.0  --  89.7  PLT 185  --  182   CBC NO Diff (Complete Blood Count)  Result: 07/13/2013 3:21 PM ( Status: F )  WBC 4.4 4.0-10.5 K/uL SLN  RBC 3.38 L 4.22-5.81 MIL/uL SLN  Hemoglobin 9.8 L 13.0-17.0 g/dL SLN  Hematocrit 04.529.3 L 39.0-52.0 % SLN  MCV 86.7 78.0-100.0 fL SLN  MCH 29.0 26.0-34.0 pg SLN  MCHC 33.4 30.0-36.0 g/dL SLN  RDW 40.915.2 81.1-91.411.5-15.5 % SLN  Platelet Count 252 150-400 K/uL SLN  Basic Metabolic Panel  Result: 07/13/2013 3:51 PM ( Status: F )  Sodium 137 135-145 mEq/L SLN  Potassium 4.8 3.5-5.3 mEq/L SLN  Chloride 105 96-112 mEq/L SLN  CO2 25 19-32 mEq/L SLN  Glucose 76 70-99 mg/dL SLN  BUN 28 H 7-826-23 mg/dL SLN  Creatinine 9.561.06 2.13-0.860.50-1.35 mg/dL SLN  Calcium 9.3   Assessment/Plan 1. Anemia -conts on iron, will follow up CBC  2. Hematuria -this has improved; UA C&S pending  3. WEIGHT LOSS -conts with weight loss, being followed by RD, reports good appetite and staff reports he is eating -conts on supplements   4.  Urinary retention -followed by urology, now with indwelling catheter  5. HYPERTENSION, MILD -Patients blood pressure is stable on cozaar; continue current regimen. Will monitor and make changes as necessary.  6. Unspecified constipation -without complaints has miralax daily   Labs/tests ordered Cbc and cmp

## 2013-10-04 ENCOUNTER — Other Ambulatory Visit: Payer: Self-pay | Admitting: *Deleted

## 2013-10-04 MED ORDER — HYDROCODONE-ACETAMINOPHEN 5-325 MG PO TABS: ORAL_TABLET | ORAL | Status: DC

## 2013-10-04 NOTE — Telephone Encounter (Signed)
[facility] 

## 2013-10-12 ENCOUNTER — Encounter: Payer: Self-pay | Admitting: Nurse Practitioner

## 2013-10-12 ENCOUNTER — Non-Acute Institutional Stay (SKILLED_NURSING_FACILITY): Payer: Medicare Other | Admitting: Nurse Practitioner

## 2013-10-12 DIAGNOSIS — R339 Retention of urine, unspecified: Secondary | ICD-10-CM

## 2013-10-12 DIAGNOSIS — S81009A Unspecified open wound, unspecified knee, initial encounter: Secondary | ICD-10-CM

## 2013-10-12 DIAGNOSIS — S91009A Unspecified open wound, unspecified ankle, initial encounter: Secondary | ICD-10-CM

## 2013-10-12 DIAGNOSIS — D649 Anemia, unspecified: Secondary | ICD-10-CM

## 2013-10-12 DIAGNOSIS — S81809A Unspecified open wound, unspecified lower leg, initial encounter: Secondary | ICD-10-CM

## 2013-10-12 DIAGNOSIS — I10 Essential (primary) hypertension: Secondary | ICD-10-CM

## 2013-10-12 DIAGNOSIS — R634 Abnormal weight loss: Secondary | ICD-10-CM

## 2013-10-12 NOTE — Progress Notes (Signed)
Patient ID: [name] Bennett, [name]   DOB: [birth year], 80 y.o.   MRN: [id number]    Nursing Home Location:  [facility]   Place of Service: SNF (31)  PCP: [name] RegulusMichael Norins, MD  No Known Allergies  Chief Complaint  Patient presents with  . Medical Managment of Chronic Issues    HPI:  Patient is 80 y.o. [name] s/p fall and L hip arthroplasty; pt with PMH of protein calorie-malnutrition, anemia, urinary retention, constipation, hypertension who is being seen today for routine follow up on chronic conditions. Pt is will out any notable changes in the last month, pt reports he is doing well; pt has been stable and staff without concerns.  Review of Systems:  Review of Systems  Constitutional: Negative for fever, chills and malaise/fatigue.  Respiratory: Negative for cough and shortness of breath.   Cardiovascular: Negative for chest pain and palpitations.  Gastrointestinal: Negative for heartburn, abdominal pain, diarrhea and constipation.  Genitourinary: Negative for dysuria, urgency and frequency.  Musculoskeletal: Negative for joint pain and myalgias.  Skin: Negative.   Neurological: Negative for dizziness, tingling, tremors, seizures and headaches.  Psychiatric/Behavioral: Negative for depression. The patient is not nervous/anxious.      Past Medical History  Diagnosis Date  . Hypertension   . Urinary retention   . H/O total hip arthroplasty 05/01/2013   Past Surgical History  Procedure Laterality Date  . Fracture surgery    . Hip surgery    . Hip arthroplasty Left 05/01/2013    Procedure: ARTHROPLASTY BIPOLAR HIP;  Surgeon: Shelda PalMatthew D Olin, MD;  Location: [facility];  Service: Orthopedics;  Laterality: Left;   Social History:   reports that he has never smoked. He does not have any smokeless tobacco history on file. He reports that he does not drink alcohol or use illicit drugs.  No family history on file.  Medications: Patient's Medications  New Prescriptions   No medications on file  Previous Medications   ACETAMINOPHEN (TYLENOL) 325 MG TABLET    Take 2 tablets (650 mg total) by mouth every 6 (six) hours as needed.   FEEDING SUPPLEMENT (ENSURE COMPLETE) LIQD    Take 237 mLs by mouth 3 (three) times daily with meals.   FERROUS SULFATE 325 (65 FE) MG TABLET    Take 1 tablet (325 mg total) by mouth 2 (two) times daily.   HYDROCODONE-ACETAMINOPHEN (NORCO/VICODIN) 5-325 MG PER TABLET    Take one tablet by mouth every 6 hours as needed for pain; Take two tablets by mouth every 6 hours as needed for pain   LOSARTAN (COZAAR) 50 MG TABLET    Take 1 tablet (50 mg total) by mouth daily.   MAGNESIUM CITRATE SOLN    Take 296 mLs (1 Bottle total) by mouth daily as needed.   POLYETHYLENE GLYCOL (MIRALAX / GLYCOLAX) PACKET    Take 17 g by mouth daily.  Modified Medications   No medications on file  Discontinued Medications   No medications on file     Physical Exam:  Filed Vitals:   10/12/13 1207  BP: 125/66  Pulse: 72  Temp: 98 F (36.7 C)  Resp: 20   Physical Exam  Vitals reviewed. Constitutional: He is oriented to person, place, and time.  [name], in NAD who is pleasant   HENT:  Head: Normocephalic and atraumatic.  Right Ear: External ear normal.  Left Ear: External ear normal.  Nose: Nose normal.  Mouth/Throat: Oropharynx is clear and moist. No oropharyngeal  exudate.  Neck: Normal range of motion. Neck supple.  Cardiovascular: Normal rate, regular rhythm and normal heart sounds.   Pulmonary/Chest: Effort normal and breath sounds normal. No respiratory distress.  Abdominal: Soft. Bowel sounds are normal. He exhibits no distension.  Genitourinary:  Foley catheter draining clear yellow urine   Musculoskeletal: Normal range of motion. He exhibits no edema and no tenderness.  Neurological: He is alert and oriented to person, place, and time.  Skin: Skin is warm and dry. No erythema.  Nickel size wound to anterior ankle/shin area, no erythema  or drainage noted  Psychiatric: Affect normal.      Labs reviewed: Basic Metabolic Panel:  Recent Labs  16/10/96 0450 04/29/13 1736 05/03/13 0445  NA 135 134* 131*  K 4.0 4.1 5.0  CL 101 102 100  CO2 25 23 24   GLUCOSE 101* 86 86  BUN 26* 21 17  CREATININE 1.07 0.96 1.16  CALCIUM 9.1 9.4 9.0   Liver Function Tests:  Recent Labs  04/28/13 1845 04/29/13 1736 05/03/13 0445  AST 21 16 15   ALT 11 9 6   ALKPHOS 59 50 46  BILITOT 0.6 0.8 0.3  PROT 7.6 6.5 5.8*  ALBUMIN 3.9 3.1* 2.3*   No results found for this basename: LIPASE, AMYLASE,  in the last 8760 hours No results found for this basename: AMMONIA,  in the last 8760 hours CBC:  Recent Labs  04/28/13 1845 04/28/13 1900 04/29/13 0450  WBC 8.7  --  6.5  NEUTROABS 7.8*  --   --   HGB 10.0* 10.9* 9.0*  HCT 30.7* 32.0* 26.9*  MCV 90.0  --  89.7  PLT 185  --  182  CBC NO Diff (Complete Blood Count)  Result: 07/13/2013 3:21 PM ( Status: F )  WBC 4.4 4.0-10.5 K/uL SLN  RBC 3.38 L 4.22-5.81 MIL/uL SLN  Hemoglobin 9.8 L 13.0-17.0 g/dL SLN  Hematocrit 04.5 L 39.0-52.0 % SLN  MCV 86.7 78.0-100.0 fL SLN  MCH 29.0 26.0-34.0 pg SLN  MCHC 33.4 30.0-36.0 g/dL SLN  RDW 40.9 81.1-91.4 % SLN  Platelet Count 252 150-400 K/uL SLN  Basic Metabolic Panel  Result: 07/13/2013 3:51 PM ( Status: F )  Sodium 137 135-145 mEq/L SLN  Potassium 4.8 3.5-5.3 mEq/L SLN  Chloride 105 96-112 mEq/L SLN  CO2 25 19-32 mEq/L SLN  Glucose 76 70-99 mg/dL SLN  BUN 28 H 7-82 mg/dL SLN  Creatinine 9.56 2.13-0.86 mg/dL SLN  Calcium 9.3   Assessment/Plan 1. Anemia -conts on iron; will follow up on CBC  2. HYPERTENSION, MILD -blood pressure has been well controlled on cozaar  3. Urinary retention -conts with chronic foley catheter, last month was treated for UTI, no ongoing symptoms.   4. WEIGHT LOSS -pt being followed by RD; on supplements, weight has remained stable- currently at 125 lbs  5. Constipation -without complaints, has  PRN if needed which he reports he uses occasionally   5. Ankle/shin wound -will have treatment nurse to provide ongoing monitoring and treatment as needed

## 2013-11-02 ENCOUNTER — Encounter: Payer: Self-pay | Admitting: Nurse Practitioner

## 2013-11-02 ENCOUNTER — Non-Acute Institutional Stay (SKILLED_NURSING_FACILITY): Payer: Medicare Other | Admitting: Nurse Practitioner

## 2013-11-02 DIAGNOSIS — I10 Essential (primary) hypertension: Secondary | ICD-10-CM

## 2013-11-02 DIAGNOSIS — D649 Anemia, unspecified: Secondary | ICD-10-CM

## 2013-11-02 DIAGNOSIS — R339 Retention of urine, unspecified: Secondary | ICD-10-CM

## 2013-11-02 DIAGNOSIS — K5900 Constipation, unspecified: Secondary | ICD-10-CM

## 2013-11-02 DIAGNOSIS — E43 Unspecified severe protein-calorie malnutrition: Secondary | ICD-10-CM

## 2013-11-02 NOTE — Progress Notes (Signed)
Patient ID: [name] Bennett, male   DOB: 01/06/1918, 80 y.o.   MRN: [id number]    Nursing Home Location:  [facility]   Place of Service: SNF (31)  PCP: Adella Hare, MD  No Known Allergies  Chief Complaint  Patient presents with  . Medical Managment of Chronic Issues    HPI:  Patient is 80 y.o. male with PMH of protein calorie-malnutrition, anemia, urinary retention, constipation, hypertension who is being seen today for routine follow up on chronic conditions. pts foley has been dc'd and he is voiding without difficulty. Would care nurse following open area on ankle without change or infecton  Review of Systems:  Review of Systems  Constitutional: Negative for fever and chills.  Respiratory: Negative for cough and shortness of breath.   Cardiovascular: Negative for chest pain and palpitations.  Gastrointestinal: Negative for heartburn, abdominal pain, diarrhea, constipation and blood in stool.  Genitourinary: Negative for dysuria, urgency and frequency.  Musculoskeletal: Negative for myalgias.  Skin: Negative for itching and rash.  Neurological: Negative for dizziness and headaches.  Psychiatric/Behavioral: Negative for depression. The patient is not nervous/anxious.      Past Medical History  Diagnosis Date  . Hypertension   . Urinary retention   . H/O total hip arthroplasty 05/01/2013   Past Surgical History  Procedure Laterality Date  . Fracture surgery    . Hip surgery    . Hip arthroplasty Left 05/01/2013    Procedure: ARTHROPLASTY BIPOLAR HIP;  Surgeon: Mauri Pole, MD;  Location: [facility];  Service: Orthopedics;  Laterality: Left;   Social History:   reports that he has never smoked. He does not have any smokeless tobacco history on file. He reports that he does not drink alcohol or use illicit drugs.  No family history on file.  Medications: Patient's Medications  New Prescriptions   No medications on file  Previous Medications   ACETAMINOPHEN (TYLENOL) 325 MG TABLET    Take 2 tablets (650 mg total) by mouth every 6 (six) hours as needed.   FEEDING SUPPLEMENT (ENSURE COMPLETE) LIQD    Take 237 mLs by mouth 3 (three) times daily with meals.   FERROUS SULFATE 325 (65 FE) MG TABLET    Take 1 tablet (325 mg total) by mouth 2 (two) times daily.   HYDROCODONE-ACETAMINOPHEN (NORCO/VICODIN) 5-325 MG PER TABLET    Take one tablet by mouth every 6 hours as needed for pain; Take two tablets by mouth every 6 hours as needed for pain   LOSARTAN (COZAAR) 50 MG TABLET    Take 1 tablet (50 mg total) by mouth daily.   MAGNESIUM CITRATE SOLN    Take 296 mLs (1 Bottle total) by mouth daily as needed.   POLYETHYLENE GLYCOL (MIRALAX / GLYCOLAX) PACKET    Take 17 g by mouth daily.  Modified Medications   No medications on file  Discontinued Medications   No medications on file     Physical Exam:  Filed Vitals:   11/02/13 1510  BP: 113/73  Pulse: 92  Temp: 96.4 F (35.8 C)  Resp: 20    Physical Exam  Vitals reviewed. Constitutional: He is oriented to person, place, and time.  Thin male, in NAD who is pleasant   HENT:  Head: Normocephalic and atraumatic.  Right Ear: External ear normal.  Left Ear: External ear normal.  Nose: Nose normal.  Mouth/Throat: Oropharynx is clear and moist. No oropharyngeal exudate.  Neck: Normal range of motion. Neck supple.  Cardiovascular: Normal rate, regular rhythm and normal heart sounds.   Pulmonary/Chest: Effort normal and breath sounds normal. No respiratory distress.  Abdominal: Soft. Bowel sounds are normal. He exhibits no distension.  Genitourinary:  Foley catheter draining clear yellow urine   Musculoskeletal: Normal range of motion. He exhibits no edema and no tenderness.  Neurological: He is alert and oriented to person, place, and time.  Skin: Skin is warm and dry. No erythema.  Nickel size wound to anterior ankle/shin area, no erythema or drainage noted- unchanged  Psychiatric:  Affect normal.     Labs reviewed: Basic Metabolic Panel:  Recent Labs  04/29/13 0450 04/29/13 1736 05/03/13 0445  NA 135 134* 131*  K 4.0 4.1 5.0  CL 101 102 100  CO2 _0 GLUCOSE 101* 86 86  BUN 26* 21 17  CREATININE 1.07 0.96 1.16  CALCIUM 9.1 9.4 9.0   Liver Function Tests:  Recent Labs  04/28/13 1845 04/29/13 1736 05/03/13 0445  AST _1 ALT _2 ALKPHOS 59 50 46  BILITOT 0.6 0.8 0.3  PROT 7.6 6.5 5.8*  ALBUMIN 3.9 3.1* 2.3*   No results found for this basename: LIPASE, AMYLASE,  in the last 8760 hours No results found for this basename: AMMONIA,  in the last 8760 hours CBC:  Recent Labs  04/28/13 1845 04/28/13 1900 04/29/13 0450  WBC 8.7  --  6.5  NEUTROABS 7.8*  --   --   HGB 10.0* 10.9* 9.0*  HCT 30.7* 32.0* 26.9*  MCV 90.0  --  89.7  PLT 185  --  182  CBC NO Diff (Complete Blood Count)  Result: 07/13/2013 3:21 PM ( Status: F )  WBC 4.4 4.0-10.5 K/uL SLN  RBC 3.38 L 4.22-5.81 MIL/uL SLN  Hemoglobin 9.8 L 13.0-17.0 g/dL SLN  Hematocrit 29.3 L 39.0-52.0 % SLN  MCV 86.7 78.0-100.0 fL SLN  MCH 29.0 26.0-34.0 pg SLN  MCHC 33.4 30.0-36.0 g/dL SLN  RDW 15.2 11.5-15.5 % SLN  Platelet Count 252 150-400 K/uL SLN  Basic Metabolic Panel  Result: 41/58/3094 3:51 PM ( Status: F )  Sodium 137 135-145 mEq/L SLN  Potassium 4.8 3.5-5.3 mEq/L SLN  Chloride 105 96-112 mEq/L SLN  CO2 25 19-32 mEq/L SLN  Glucose 76 70-99 mg/dL SLN  BUN 28 H 6-23 mg/dL SLN  Creatinine 1.06 0.50-1.35 mg/dL SLN  Calcium 9.3 CBC with Diff    Result: 10/13/2013 3:26 PM   ( Status: F )     C WBC 5.9     4.0-10.5 K/uL SLN   RBC 2.89   L 4.22-5.81 MIL/uL SLN   Hemoglobin 8.3   L 13.0-17.0 g/dL SLN   Hematocrit 25.5   L 39.0-52.0 % SLN   MCV 88.2     78.0-100.0 fL SLN   MCH 28.7     26.0-34.0 pg SLN   MCHC 32.5     30.0-36.0 g/dL SLN   RDW 18.3   H 11.5-15.5 % SLN   Platelet Count 300     150-400 K/uL SLN   Granulocyte % 64     43-77 % SLN   Absolute Gran 3.8       1.7-7.7 K/uL SLN   Lymph % 24     12-46 % SLN   Absolute Lymph 1.4     0.7-4.0 K/uL SLN   Mono % 9     3-12 % SLN   Absolute Mono 0.5     0.1-1.0 K/uL  SLN   Eos % 2     0-5 % SLN   Absolute Eos 0.1     0.0-0.7 K/uL SLN   Baso % 1     0-1 % SLN   Absolute Baso 0.1     0.0-0.1 K/uL SLN   Smear Review Criteria for review not met  SLN   Basic Metabolic Panel    Result: 10/13/2013 4:04 PM   ( Status: F )       Sodium 136     135-145 mEq/L SLN   Potassium 4.4     3.5-5.3 mEq/L SLN   Chloride 105     96-112 mEq/L SLN   CO2 22     19-32 mEq/L SLN   Glucose 106   H 70-99 mg/dL SLN   BUN 41   H 6-23 mg/dL SLN   Creatinine 0.95     0.50-1.35 mg/dL SLN   Calcium 8.7     8.4-10.5 mg/dL SLN    Assessment/Plan 1. Unspecified constipation -BMs regular at this time, no complaints of constipation   2. HYPERTENSION, MILD -stable on cozaar, will cont current medicaitons  3. Urinary retention -foley has been dc'd; conts to void without difficulty   4. Anemia -worse since last labs; will follow up CBC with iron panel in 1 week.   5. Protein-calorie malnutrition, severe -with weight gain to 129 lbs; reports increase in appetite, will cont supplements

## 2013-11-14 ENCOUNTER — Encounter (HOSPITAL_COMMUNITY): Payer: Self-pay | Admitting: Emergency Medicine

## 2013-11-14 ENCOUNTER — Emergency Department (HOSPITAL_COMMUNITY)
Admission: EM | Admit: 2013-11-14 | Discharge: 2013-11-14 | Disposition: A | Discharge status: Home / Self Care | Payer: Medicare Other | Attending: Emergency Medicine | Admitting: Emergency Medicine

## 2013-11-14 DIAGNOSIS — Z96649 Presence of unspecified artificial hip joint: Secondary | ICD-10-CM | POA: Insufficient documentation

## 2013-11-14 DIAGNOSIS — R319 Hematuria, unspecified: Secondary | ICD-10-CM

## 2013-11-14 DIAGNOSIS — Z79899 Other long term (current) drug therapy: Secondary | ICD-10-CM | POA: Insufficient documentation

## 2013-11-14 DIAGNOSIS — I10 Essential (primary) hypertension: Secondary | ICD-10-CM | POA: Insufficient documentation

## 2013-11-14 DIAGNOSIS — R3915 Urgency of urination: Secondary | ICD-10-CM | POA: Insufficient documentation

## 2013-11-14 DIAGNOSIS — R350 Frequency of micturition: Secondary | ICD-10-CM | POA: Insufficient documentation

## 2013-11-14 LAB — CBC WITH DIFFERENTIAL/PLATELET
BASOS ABS: 0 10*3/uL (ref 0.0–0.1)
Basophils Relative: 0 % (ref 0–1)
EOS ABS: 0.1 10*3/uL (ref 0.0–0.7)
Eosinophils Relative: 2 % (ref 0–5)
HCT: 32.6 % — ABNORMAL LOW (ref 39.0–52.0)
Hemoglobin: 10.4 g/dL — ABNORMAL LOW (ref 13.0–17.0)
LYMPHS ABS: 1.1 10*3/uL (ref 0.7–4.0)
Lymphocytes Relative: 19 % (ref 12–46)
MCH: 29.7 pg (ref 26.0–34.0)
MCHC: 31.9 g/dL (ref 30.0–36.0)
MCV: 93.1 fL (ref 78.0–100.0)
MONO ABS: 0.6 10*3/uL (ref 0.1–1.0)
Monocytes Relative: 10 % (ref 3–12)
Neutro Abs: 4.2 10*3/uL (ref 1.7–7.7)
Neutrophils Relative %: 69 % (ref 43–77)
PLATELETS: 230 10*3/uL (ref 150–400)
RBC: 3.5 MIL/uL — ABNORMAL LOW (ref 4.22–5.81)
RDW: 16 % — AB (ref 11.5–15.5)
WBC: 6 10*3/uL (ref 4.0–10.5)

## 2013-11-14 LAB — URINALYSIS, ROUTINE W REFLEX MICROSCOPIC
Glucose, UA: 250 mg/dL — AB
Ketones, ur: >80 mg/dL — AB
NITRITE: POSITIVE — AB
Protein, ur: >300 mg/dL — AB
SPECIFIC GRAVITY, URINE: 1.026 (ref 1.005–1.030)
Urobilinogen, UA: 4 mg/dL — ABNORMAL HIGH (ref 0.0–1.0)
pH: 5 (ref 5.0–8.0)

## 2013-11-14 LAB — BASIC METABOLIC PANEL
BUN: 39 mg/dL — ABNORMAL HIGH (ref 6–23)
CALCIUM: 9.5 mg/dL (ref 8.4–10.5)
CO2: 23 mEq/L (ref 19–32)
CREATININE: 1.16 mg/dL (ref 0.50–1.35)
Chloride: 100 mEq/L (ref 96–112)
GFR calc Af Amer: 60 mL/min — ABNORMAL LOW (ref >90)
GFR, EST NON AFRICAN AMERICAN: 52 mL/min — AB (ref >90)
Glucose, Bld: 98 mg/dL (ref 70–99)
Potassium: 4.9 mEq/L (ref 3.7–5.3)
Sodium: 135 mEq/L — ABNORMAL LOW (ref 137–147)

## 2013-11-14 LAB — URINE MICROSCOPIC-ADD ON: Bacteria, UA: NONE SEEN

## 2013-11-14 LAB — I-STAT TROPONIN, ED: Troponin i, poc: 0.01 ng/mL (ref 0.00–0.08)

## 2013-11-14 NOTE — ED Provider Notes (Signed)
CSN: [id number]     Arrival date & time 11/14/13  0801 History   First MD Initiated Contact with Patient 11/14/13 0805     Chief complaint: Hematuria HPI Patient presents to the emergency room with complaints of blood in his urine. Patient first started noticing some symptoms on Friday. Over the last the symptoms have increased. He has had some blood-tinged urine initially. Now it looks like frank blood. He's also had some urinary frequency and urgency. Today he is feels like he is unable to empty his bladder completely. Patient is in a nursing care facility. The staff at the facility by report would not place a Foley catheter because of the blood. Patient requested to come to the emergency room for evaluation. He denies any pain. Denies any fevers. Denies any vomiting or diarrhea.  Patient has had similar problems in the past. He had a Foley catheter in place for a period of time. He was seen by urology. He says the last 2 weeks he has not had a catheter and has been urinating normally. Past Medical History  Diagnosis Date  . Hypertension   . Urinary retention   . H/O total hip arthroplasty 05/01/2013   Past Surgical History  Procedure Laterality Date  . Fracture surgery    . Hip surgery    . Hip arthroplasty Left 05/01/2013    Procedure: ARTHROPLASTY BIPOLAR HIP;  Surgeon: Shelda PalMatthew D Olin, MD;  Location: [facility];  Service: Orthopedics;  Laterality: Left;   No family history on file. History  Substance Use Topics  . Smoking status: Never Smoker   . Smokeless tobacco: Not on file  . Alcohol Use: No    Review of Systems  All other systems reviewed and are negative.     Allergies  Review of patient's allergies indicates no known allergies.  Home Medications   Prior to Admission medications   Medication Sig Start Date End Date Taking? Authorizing Provider  acetaminophen (TYLENOL) 325 MG tablet Take 2 tablets (650 mg total) by mouth every 6 (six) hours as needed. 05/03/13   Jacques NavyMichael E  Norins, MD  feeding supplement (ENSURE COMPLETE) LIQD Take 237 mLs by mouth 3 (three) times daily with meals. 05/03/13   Jacques NavyMichael E Norins, MD  ferrous sulfate 325 (65 FE) MG tablet Take 1 tablet (325 mg total) by mouth 2 (two) times daily. 05/03/13   Jacques NavyMichael E Norins, MD  HYDROcodone-acetaminophen (NORCO/VICODIN) 5-325 MG per tablet Take one tablet by mouth every 6 hours as needed for pain; Take two tablets by mouth every 6 hours as needed for pain 10/04/13   Tiffany L Reed, DO  losartan (COZAAR) 50 MG tablet Take 1 tablet (50 mg total) by mouth daily. 07/31/12   Jacques NavyMichael E Norins, MD  magnesium citrate SOLN Take 296 mLs (1 Bottle total) by mouth daily as needed. 05/03/13   Jacques NavyMichael E Norins, MD  polyethylene glycol (MIRALAX / GLYCOLAX) packet Take 17 g by mouth daily. 05/03/13   Jacques NavyMichael E Norins, MD   BP 113/59  Pulse 90  Temp(Src) 97.7 F (36.5 C) (Oral)  Resp 18  SpO2 93% Physical Exam  Nursing note and vitals reviewed. Constitutional: No distress.  HENT:  Head: Normocephalic and atraumatic.  Right Ear: External ear normal.  Left Ear: External ear normal.  Eyes: Conjunctivae are normal. Right eye exhibits no discharge. Left eye exhibits no discharge. No scleral icterus.  Neck: Neck supple. No tracheal deviation present.  Cardiovascular: Normal rate, regular rhythm and intact distal  pulses.   Pulmonary/Chest: Effort normal and breath sounds normal. No stridor. No respiratory distress. He has no wheezes. He has no rales.  Abdominal: Soft. Bowel sounds are normal. He exhibits no distension. There is no tenderness. There is no rebound and no guarding.  Genitourinary: No phimosis. Discharge (blood at the meatus) found.  Musculoskeletal: He exhibits no edema and no tenderness.  Neurological: He is alert. He has normal strength. No cranial nerve deficit (no facial droop, extraocular movements intact, no slurred speech) or sensory deficit. He exhibits normal muscle tone. He displays no seizure activity.  Coordination normal.  Skin: Skin is warm and dry. No rash noted. He is not diaphoretic.  Psychiatric: He has a normal mood and affect.    ED Course  Procedures (including critical care time) Labs Review Labs Reviewed  CBC WITH DIFFERENTIAL - Abnormal; Notable for the following:    RBC 3.50 (*)    Hemoglobin 10.4 (*)    HCT 32.6 (*)    RDW 16.0 (*)    All other components within normal limits  BASIC METABOLIC PANEL - Abnormal; Notable for the following:    Sodium 135 (*)    BUN 39 (*)    GFR calc non Af Amer 52 (*)    GFR calc Af Amer 60 (*)    All other components within normal limits  URINALYSIS, ROUTINE W REFLEX MICROSCOPIC - Abnormal; Notable for the following:    Color, Urine RED (*)    APPearance TURBID (*)    Glucose, UA 250 (*)    Hgb urine dipstick LARGE (*)    Bilirubin Urine LARGE (*)    Ketones, ur >80 (*)    Protein, ur >300 (*)    Urobilinogen, UA 4.0 (*)    Nitrite POSITIVE (*)    Leukocytes, UA LARGE (*)    All other components within normal limits  URINE CULTURE  URINE MICROSCOPIC-ADD ON  I-STAT TROPOININ, ED      EKG Interpretation   Date/Time:  Sunday November 14 2013 09:43:03 EDT Ventricular Rate:  71 PR Interval:  157 QRS Duration: 98 QT Interval:  390 QTC Calculation: 424 R Axis:   89 Text Interpretation:  Sinus rhythm Borderline right axis deviation  Borderline low voltage, extremity leads Borderline ST elevation, lateral  leads Baseline wander in lead(s) III aVF V5 No significant change since  last tracing Confirmed by Mimi Debellis  MD-J, Blaize Nipper ([id number]) on 11/14/2013 9:49:27 AM     09 35  Pt is asymptomatic but is having episodes of bradycardia into the 30s.  Will check troponin, ECG and monitor. I was notified later that the bradycardia was determined by the pulse ox machine and manual check.  Pt was not on cardiac monitor.   ECG and monitor ordered and heart rate is in the 70s.  Will monitor.  1220  Pt was monitored in the ED.  No bradycardia noted.   I doubt that he actually had a bradycardic episode.  It was never documented on the monitor.  MDM   Final diagnoses:  Hematuria    Pt with recurrent hematuria.  Will send off urine culture but appears to be primarily blood.  Labs stable. Foley irrigated.  Will dc home with foley catheter, follow up with urology    Celene KrasJon R Sammye Staff, MD 11/14/13 [phone]

## 2013-11-14 NOTE — ED Notes (Signed)
EDP made aware of [facility]

## 2013-11-14 NOTE — ED Notes (Signed)
Request for 20 French Foley Cath sent to main supply.

## 2013-11-14 NOTE — ED Notes (Signed)
Pt. Placed on 5 [name] Bennett and EKG. New Pulse ox.

## 2013-11-14 NOTE — ED Notes (Signed)
[facility] Paged. 

## 2013-11-14 NOTE — ED Notes (Signed)
Notified [facility] for transportation back to [facility]

## 2013-11-14 NOTE — Discharge Instructions (Signed)
Hematuria, Adult °Hematuria is blood in your urine. It can be caused by a bladder infection, kidney infection, prostate infection, kidney stone, or cancer of your urinary tract. Infections can usually be treated with medicine, and a kidney stone usually will pass through your urine. If neither of these is the cause of your hematuria, further workup to find out the reason may be needed. °It is very important that you tell your health care provider about any blood you see in your urine, even if the blood stops without treatment or happens without causing pain. Blood in your urine that happens and then stops and then happens again can be a symptom of a very serious condition. Also, pain is not a symptom in the initial stages of many urinary cancers. °HOME CARE INSTRUCTIONS  °· Drink lots of fluid, 3 4 quarts a day. If you have been diagnosed with an infection, cranberry juice is especially recommended, in addition to large amounts of water. °· Avoid caffeine, tea, and carbonated beverages, because they tend to irritate the bladder. °· Avoid alcohol because it may irritate the prostate. °· Only take over-the-counter or prescription medicines for pain, discomfort, or fever as directed by your health care provider. °· If you have been diagnosed with a kidney stone, follow your health care provider's instructions regarding straining your urine to catch the stone. °· Empty your bladder often. Avoid holding urine for long periods of time. °· After a bowel movement, women should cleanse front to back. Use each tissue only once. °· Empty your bladder before and after sexual intercourse if you are a male. °SEEK MEDICAL CARE IF: °You develop back pain, fever, a feeling of sickness in your stomach (nausea), or vomiting or if your symptoms are not better in 3 days. Return sooner if you are getting worse. °SEEK IMMEDIATE MEDICAL CARE IF:  °· You have a persistent fever, with a temperature of 101.8°F (38.8°C) or greater. °· You  develop severe vomiting and are unable to keep the medicine down. °· You develop severe back or abdominal pain despite taking your medicines. °· You begin passing a large amount of blood or clots in your urine. °· You feel extremely weak or faint, or you pass out. °MAKE SURE YOU:  °· Understand these instructions. °· Will watch your condition. °· Will get help right away if you are not doing well or get worse. °Document Released: 07/15/2005 Document Revised: 05/05/2013 Document Reviewed: 03/15/2013 °[employer]® Patient Information ©2014 [employer]. ° °

## 2013-11-14 NOTE — ED Notes (Signed)
80 yo [place] from [facility] with c/o hematuria that started 4 days ago. Pt. Noticed light blood on Thursday that changed to its current frank red and thick blood in urine. Facility reports 700cc of bloody uring and 50-100 cc of blood with EMS. HX of hematuria and denies blood thinners.

## 2013-11-15 ENCOUNTER — Non-Acute Institutional Stay (SKILLED_NURSING_FACILITY): Payer: Medicare Other | Admitting: Internal Medicine

## 2013-11-15 DIAGNOSIS — R319 Hematuria, unspecified: Secondary | ICD-10-CM

## 2013-11-15 LAB — URINE CULTURE: Colony Count: >=100000

## 2013-11-21 ENCOUNTER — Encounter: Payer: Self-pay | Admitting: Internal Medicine

## 2013-11-21 NOTE — Progress Notes (Signed)
MRN: [id number] Name: [name] Bennett  Sex: male Age: 80 y.o. DOB: 1918/04/29  [facility]: [facility] Level Of Care: SNF Provider: Margit HanksAnne D Burma Ketcher Emergency Contacts: Extended Emergency Contact Information Primary Emergency Contact: Beaulah DinningGuy,Ethna  [place] Home Phone: [phone] Relation: Niece Secondary Emergency Contact: Insley,Beverly  [place] Home Phone: [phone] Relation: Niece    Allergies: Review of patient's allergies indicates no known allergies.  Chief Complaint  Patient presents with  . Acute Visit    HPI: Patient is 80 y.o. male who nursing asked me to see because he is having hematuria again.  Past Medical History  Diagnosis Date  . Hypertension   . Urinary retention   . H/O total hip arthroplasty 05/01/2013    Past Surgical History  Procedure Laterality Date  . Fracture surgery    . Hip surgery    . Hip arthroplasty Left 05/01/2013    Procedure: ARTHROPLASTY BIPOLAR HIP;  Surgeon: Shelda PalMatthew D Olin, MD;  Location: [facility];  Service: Orthopedics;  Laterality: Left;      Medication List       This list is accurate as of: 11/15/13 11:59 PM.  Always use your most recent med list.               acetaminophen 325 MG tablet  Commonly known as:  TYLENOL  Take 2 tablets (650 mg total) by mouth every 6 (six) hours as needed.     feeding supplement (ENSURE COMPLETE) Liqd  Take 237 mLs by mouth 3 (three) times daily with meals.     ferrous sulfate 325 (65 FE) MG tablet  Take 1 tablet (325 mg total) by mouth 2 (two) times daily.     HYDROcodone-acetaminophen 5-325 MG per tablet  Commonly known as:  NORCO/VICODIN  Take 1-2 tablets by mouth every 6 (six) hours as needed for moderate pain.     losartan 50 MG tablet  Commonly known as:  COZAAR  Take 1 tablet (50 mg total) by mouth daily.     magnesium citrate Soln  Take 1 Bottle by mouth daily as needed for mild constipation or severe constipation.     magnesium citrate Soln  Take 296 mLs (1 Bottle total) by mouth daily as needed.     polyethylene glycol packet  Commonly known as:  MIRALAX / GLYCOLAX  Take 17 g by mouth daily.        No orders of the defined types were placed in this encounter.     There is no immunization history on file for this patient.  History  Substance Use Topics  . Smoking status: Never Smoker   . Smokeless tobacco: Not on file  . Alcohol Use: No    Review of Systems  DATA OBTAINED: from patient, nurse GENERAL: Feels well no fevers, fatigue, appetite changes SKIN: No itching, rash HEENT: No complaint RESPIRATORY: No cough, wheezing, SOB CARDIAC: No chest pain, palpitations, lower extremity edema  GI: No abdominal pain, No N/V/D or constipation, No heartburn or reflux  GU: No dysuria, frequency or urgency, or incontinence; pt has foley, is comfortable  MUSCULOSKELETAL: No unrelieved bone/joint pain NEUROLOGIC: No headache, dizziness or focal weakness PSYCHIATRIC: No overt anxiety or sadness. Sleeps well.   Filed Vitals:   11/21/13 1844  BP: 113/59  Pulse: 90  Temp: 97.7 F (36.5 C)  Resp: 18    Physical Exam  GENERAL APPEARANCE: Alert, conversant. Appropriately groomed. No acute distress  SKIN: No diaphoresis rash HEENT:  Unremarkable RESPIRATORY: Breathing is even, unlabored. Lung sounds are clear   CARDIOVASCULAR: Heart RRR no murmurs, rubs or gallops. No peripheral edema  GASTROINTESTINAL: Abdomen is soft, non-tender, not distended w/ normal bowel sounds.  GENITOURINARY: Bladder non tender, not distended ; foley cath with maroon urine MUSCULOSKELETAL: No abnormal joints or musculature NEUROLOGIC: Cranial nerves 2-12 grossly intact. Moves all extremities no tremor. PSYCHIATRIC: Mood and affect appropriate to situation, no behavioral issues  Patient Active Problem List   Diagnosis Date Noted  . Chronic indwelling Foley catheter 07/12/2013  . H/O total hip arthroplasty   .  Protein-calorie malnutrition, severe 04/30/2013  . Weakness generalized 04/28/2013  . Left leg pain 04/28/2013  . Hematuria 09/15/2011  . UTI (lower urinary tract infection) 09/15/2011  . Anemia 09/15/2011  . Unspecified constipation 09/15/2011  . Urinary retention 09/15/2011  . WEIGHT LOSS 07/27/2009  . HIP FRACTURE, RIGHT 01/22/2008  . HYPERTENSION, MILD 10/03/2007  . DEGENERATIVE JOINT DISEASE, KNEES, BILATERAL 10/03/2007  . HERNIORRHAPHY, HX OF 10/03/2007    CBC    Component Value Date/Time   WBC 6.0 11/14/2013 0846   RBC 3.50* 11/14/2013 0846   RBC 3.28* 04/28/2013 2115   HGB 10.4* 11/14/2013 0846   HCT 32.6* 11/14/2013 0846   PLT 230 11/14/2013 0846   MCV 93.1 11/14/2013 0846   LYMPHSABS 1.1 11/14/2013 0846   MONOABS 0.6 11/14/2013 0846   EOSABS 0.1 11/14/2013 0846   BASOSABS 0.0 11/14/2013 0846    CMP     Component Value Date/Time   NA 135* 11/14/2013 0846   K 4.9 11/14/2013 0846   CL 100 11/14/2013 0846   CO2 23 11/14/2013 0846   GLUCOSE 98 11/14/2013 0846   BUN 39* 11/14/2013 0846   CREATININE 1.16 11/14/2013 0846   CALCIUM 9.5 11/14/2013 0846   PROT 5.8* 05/03/2013 0445   ALBUMIN 2.3* 05/03/2013 0445   AST 15 05/03/2013 0445   ALT 6 05/03/2013 0445   ALKPHOS 46 05/03/2013 0445   BILITOT 0.3 05/03/2013 0445   GFRNONAA 52* 11/14/2013 0846   GFRAA 60* 11/14/2013 0846    Assessment and Plan  Hematuria Pt is having hematuria again. Had foley placed last night in ED at his request. Has had a foley cath before so no surprises. I checked the urine cx and it was neg-lots of bacteria but polymorphic.Hb is 10.4 which is significantly improved over prior. Pt has no c/o discomfort today. Will monitor sx and signs and plan d/c foley or sent to urology depending on what unfolds. Will continue iron as before.    Margit HanksAnne D Damaria Stofko, MD

## 2013-11-21 NOTE — Assessment & Plan Note (Addendum)
Pt is having hematuria again. Had foley placed last night in ED at his request. Has had a foley cath before so no surprises. I checked the urine cx and it was neg-lots of bacteria but polymorphic.Hb is 10.4 which is significantly improved over prior. Pt has no c/o discomfort today. Will monitor sx and signs and plan d/c foley or sent to urology depending on what unfolds. Will continue iron as before.

## 2013-11-22 ENCOUNTER — Encounter: Payer: Self-pay | Admitting: Internal Medicine

## 2013-11-22 NOTE — Progress Notes (Signed)
This encounter was created in error - please disregard.

## 2013-11-23 ENCOUNTER — Non-Acute Institutional Stay (SKILLED_NURSING_FACILITY): Payer: Medicare Other | Admitting: Nurse Practitioner

## 2013-11-23 DIAGNOSIS — D649 Anemia, unspecified: Secondary | ICD-10-CM

## 2013-11-23 NOTE — Progress Notes (Signed)
Patient ID: [name] Bennett, male   DOB: Dec 15, 1917, 80 y.o.   MRN: [id number]    Nursing Home Location:  [facility]   Place of Service: SNF (31)  PCP: Adella Hare, MD  No Known Allergies  Chief Complaint  Patient presents with  . Acute Visit    HPI:  80 year old male being seen today due to drop in hgb in the past 2 weeks. Pt denies seeing any blood, however was sent to the hospital recently due to hemituria. Currently urine is yellow and clear, no signs of blood;  Does report dark green stools (is on iron BID); denies shortness of breath, chest pain  Review of Systems:  Review of Systems  Constitutional: Negative for fever and chills.  Respiratory: Negative for cough, hemoptysis and shortness of breath.   Cardiovascular: Negative for chest pain and palpitations.  Gastrointestinal: Negative for heartburn, abdominal pain, diarrhea, constipation, blood in stool and melena.  Genitourinary: Negative for hematuria.  Musculoskeletal: Negative for myalgias.  Skin: Negative for itching and rash.  Neurological: Negative for dizziness and headaches.     Past Medical History  Diagnosis Date  . Hypertension   . Urinary retention   . H/O total hip arthroplasty 05/01/2013   Past Surgical History  Procedure Laterality Date  . Fracture surgery    . Hip surgery    . Hip arthroplasty Left 05/01/2013    Procedure: ARTHROPLASTY BIPOLAR HIP;  Surgeon: Mauri Pole, MD;  Location: [facility];  Service: Orthopedics;  Laterality: Left;   Social History:   reports that he has never smoked. He does not have any smokeless tobacco history on file. He reports that he does not drink alcohol or use illicit drugs.  No family history on file.  Medications: Patient's Medications  New Prescriptions   No medications on file  Previous Medications   ACETAMINOPHEN (TYLENOL) 325 MG TABLET    Take 2 tablets (650 mg total) by mouth every 6 (six) hours as needed.   FEEDING SUPPLEMENT  (ENSURE COMPLETE) LIQD    Take 237 mLs by mouth 3 (three) times daily with meals.   FERROUS SULFATE 325 (65 FE) MG TABLET    Take 1 tablet (325 mg total) by mouth 2 (two) times daily.   HYDROCODONE-ACETAMINOPHEN (NORCO/VICODIN) 5-325 MG PER TABLET    Take 1-2 tablets by mouth every 6 (six) hours as needed for moderate pain.   LOSARTAN (COZAAR) 50 MG TABLET    Take 1 tablet (50 mg total) by mouth daily.   MAGNESIUM CITRATE SOLN    Take 296 mLs (1 Bottle total) by mouth daily as needed.   MAGNESIUM CITRATE SOLN    Take 1 Bottle by mouth daily as needed for mild constipation or severe constipation.   POLYETHYLENE GLYCOL (MIRALAX / GLYCOLAX) PACKET    Take 17 g by mouth daily.  Modified Medications   No medications on file  Discontinued Medications   No medications on file     Physical Exam:  Filed Vitals:   11/23/13 1505  BP: 122/66  Pulse: 60  Temp: 98 F (36.7 C)  Resp: 20    Physical Exam  Vitals reviewed. Constitutional: He is oriented to person, place, and time.  Thin male, in NAD who is pleasant   HENT:  Head: Normocephalic and atraumatic.  Neck: Normal range of motion. Neck supple.  Cardiovascular: Normal rate, regular rhythm and normal heart sounds.   Pulmonary/Chest: Effort normal and breath sounds normal. No  respiratory distress.  Abdominal: Soft. Bowel sounds are normal. He exhibits no distension.  Genitourinary:  Foley catheter draining clear yellow urine   Musculoskeletal: Normal range of motion. He exhibits no edema and no tenderness.  Neurological: He is alert and oriented to person, place, and time.  Skin: Skin is warm and dry. No erythema.  Psychiatric: Affect normal.     Labs reviewed: Basic Metabolic Panel:  Recent Labs  04/29/13 1736 05/03/13 0445 11/14/13 0846  NA 134* 131* 135*  K 4.1 5.0 4.9  CL 102 100 100  CO2 _0 GLUCOSE 86 86 98  BUN 21 17 39*  CREATININE 0.96 1.16 1.16  CALCIUM 9.4 9.0 9.5   Liver Function Tests:  Recent  Labs  04/28/13 1845 04/29/13 1736 05/03/13 0445  AST _1 ALT _2 ALKPHOS 59 50 46  BILITOT 0.6 0.8 0.3  PROT 7.6 6.5 5.8*  ALBUMIN 3.9 3.1* 2.3*   No results found for this basename: LIPASE, AMYLASE,  in the last 8760 hours No results found for this basename: AMMONIA,  in the last 8760 hours CBC:  Recent Labs  04/28/13 1845 04/28/13 1900 04/29/13 0450 11/14/13 0846  WBC 8.7  --  6.5 6.0  NEUTROABS 7.8*  --   --  4.2  HGB 10.0* 10.9* 9.0* 10.4*  HCT 30.7* 32.0* 26.9* 32.6*  MCV 90.0  --  89.7 93.1  PLT 185  --  182 230   iron and IBC    Result: 11/09/2013 3:04 PM   ( Status: F )       Iron 41   L 42-165 ug/dL SLN   UIBC 179     125-400 ug/dL SLN   TIBC 220     215-435 ug/dL SLN   %SAT 19   L 20-55 % SLN   CBC NO Diff (Complete Blood Count)    Result: 11/09/2013 2:31 PM   ( Status: F )       WBC 5.1     4.0-10.5 K/uL SLN   RBC 3.42   L 4.22-5.81 MIL/uL SLN   Hemoglobin 10.0   L 13.0-17.0 g/dL SLN   Hematocrit 29.8   L 39.0-52.0 % SLN   MCV 87.1     78.0-100.0 fL SLN   MCH 29.2     26.0-34.0 pg SLN   MCHC 33.6     30.0-36.0 g/dL SLN   RDW 16.8   H 11.5-15.5 % SLN   Platelet Count 257     150-400 K/uL SLN   Ferritin    Result: 11/09/2013 2:47 PM   ( Status: F )       Ferritin 38     22-322 ng/mL SLN CBC with Diff    Result: 11/16/2013 12:22 PM   ( Status: F )     C WBC 4.9     4.0-10.5 K/uL SLN   RBC 2.49   L 4.22-5.81 MIL/uL SLN   Hemoglobin 7.2   L 13.0-17.0 g/dL SLN   Hematocrit 21.8   L 39.0-52.0 % SLN   MCV 87.6     78.0-100.0 fL SLN   MCH 28.9     26.0-34.0 pg SLN   MCHC 33.0     30.0-36.0 g/dL SLN   RDW 16.1   H 11.5-15.5 % SLN   Platelet Count 206     150-400 K/uL SLN   Granulocyte % 58     43-77 % SLN   Absolute  Gran 2.8     1.7-7.7 K/uL SLN   Lymph % 26     12-46 % SLN   Absolute Lymph 1.3     0.7-4.0 K/uL SLN   Mono % 12     3-12 % SLN   Absolute Mono 0.6     0.1-1.0 K/uL SLN   Eos % 4     0-5 % SLN   Absolute Eos 0.2      0.0-0.7 K/uL SLN   Baso % 0     0-1 % SLN   Absolute Baso 0.0     0.0-0.1 K/uL SLN   Smear Review Criteria for review not met  SLN   Assessment/Plan 1. Anemia -clinically pt is stable but has had a drop in hgb from 10.2 to 7.2 in 14 days. Will repeat cbc today to confirm and recheck hgb -appears to have been started on ASA BID in October, will stop this  -will increase iron to TID -will get hemoccult 3 times to rule out GI bleed

## 2013-11-25 ENCOUNTER — Encounter: Payer: Self-pay | Admitting: Internal Medicine

## 2013-11-25 ENCOUNTER — Non-Acute Institutional Stay (SKILLED_NURSING_FACILITY): Payer: Medicare Other | Admitting: Internal Medicine

## 2013-11-25 DIAGNOSIS — Z978 Presence of other specified devices: Secondary | ICD-10-CM

## 2013-11-25 DIAGNOSIS — R319 Hematuria, unspecified: Secondary | ICD-10-CM

## 2013-11-25 DIAGNOSIS — Z960 Presence of urogenital implants: Secondary | ICD-10-CM

## 2013-11-25 DIAGNOSIS — Z9889 Other specified postprocedural states: Secondary | ICD-10-CM

## 2013-11-25 DIAGNOSIS — D649 Anemia, unspecified: Secondary | ICD-10-CM

## 2013-11-25 NOTE — Progress Notes (Signed)
MRN: [id number] Name: Zenaida DeedRussell L Pittinger  Sex: male Age: 80 y.o. DOB: 08-06-1917  [facility]: 222B Level Of Care: SNF Provider: Margit HanksAnne D Alexander Emergency Contacts: Extended Emergency Contact Information Primary Emergency Contact: Beaulah DinningGuy,[place]na  [place] Home Phone: [phone] Relation: Niece Secondary Emergency Contact: Mikita,Beverly  [place] Home Phone: [phone] Relation: Niece  Code Status: FULL  Allergies: Review of patient's allergies indicates no known allergies.  Chief Complaint  Patient presents with  . Acute Visit    HPI: Patient is 80 y.o. male who is being seen today for a follow up lab for anemiA AND IRON STUDIES.  Past Medical History  Diagnosis Date  . Hypertension   . Urinary retention   . H/O total hip arthroplasty 05/01/2013    Past Surgical History  Procedure Laterality Date  . Fracture surgery    . Hip surgery    . Hip arthroplasty Left 05/01/2013    Procedure: ARTHROPLASTY BIPOLAR HIP;  Surgeon: Shelda PalMatthew D Olin, MD;  Location: [facility];  Service: Orthopedics;  Laterality: Left;      Medication List       This list is accurate as of: 11/25/13  1:23 PM.  Always use your most recent med list.               acetaminophen 325 MG tablet  Commonly known as:  TYLENOL  Take 2 tablets (650 mg total) by mouth every 6 (six) hours as needed.     feeding supplement (ENSURE COMPLETE) Liqd  Take 237 mLs by mouth 3 (three) times daily with meals.     ferrous sulfate 325 (65 FE) MG tablet  Take 1 tablet (325 mg total) by mouth 2 (two) times daily.     HYDROcodone-acetaminophen 5-325 MG per tablet  Commonly known as:  NORCO/VICODIN  Take 1-2 tablets by mouth every 6 (six) hours as needed for moderate pain.     losartan 50 MG tablet  Commonly known as:  COZAAR  Take 1 tablet (50 mg total) by mouth daily.     magnesium citrate Soln  Take 1 Bottle by mouth daily as needed for mild constipation or  severe constipation.     magnesium citrate Soln  Take 296 mLs (1 Bottle total) by mouth daily as needed.     polyethylene glycol packet  Commonly known as:  MIRALAX / GLYCOLAX  Take 17 g by mouth daily.        No orders of the defined types were placed in this encounter.     There is no immunization history on file for this patient.  History  Substance Use Topics  . Smoking status: Never Smoker   . Smokeless tobacco: Not on file  . Alcohol Use: No    Review of Systems  DATA OBTAINED: from patient, nurse GENERAL: Feels well no fevers, fatigue, appetite changes SKIN: No itching, rash HEENT: No complaint RESPIRATORY: No cough, wheezing, SOB CARDIAC: No chest pain, palpitations, lower extremity edema  GI: No abdominal pain, No N/V/D or constipation, No heartburn or reflux  GU: no hematuria;still with foley  MUSCULOSKELETAL: No unrelieved bone/joint pain NEUROLOGIC: No headache, dizziness or focal weakness PSYCHIATRIC: No overt anxiety or sadness. Sleeps well.   Filed Vitals:   11/25/13 1308  BP: 144/68  Pulse: 75  Temp: 98.9 F (37.2 C)  Resp: 16    Physical Exam  GENERAL APPEARANCE: Alert, conversant. Appropriately groomed. No acute distress  SKIN: No diaphoresis rash HEENT: Unremarkable  RESPIRATORY: Breathing is even, unlabored. Lung sounds are clear   CARDIOVASCULAR: Heart RRR no murmurs, rubs or gallops. No peripheral edema  GASTROINTESTINAL: Abdomen is soft, non-tender, not distended w/ normal bowel sounds.  GENITOURINARY: Bladder non tender, not distended  MUSCULOSKELETAL: No abnormal joints or musculature NEUROLOGIC: Cranial nerves 2-12 grossly intact. Moves all extremities no tremor. PSYCHIATRIC: Mood and affect appropriate to situation, no behavioral issues  Patient Active Problem List   Diagnosis Date Noted  . Chronic indwelling Foley catheter 07/12/2013  . H/O total hip arthroplasty   . Protein-calorie malnutrition, severe 04/30/2013  .  Weakness generalized 04/28/2013  . Left leg pain 04/28/2013  . Hematuria 09/15/2011  . UTI (lower urinary tract infection) 09/15/2011  . Anemia 09/15/2011  . Unspecified constipation 09/15/2011  . Urinary retention 09/15/2011  . WEIGHT LOSS 07/27/2009  . HIP FRACTURE, RIGHT 01/22/2008  . HYPERTENSION, MILD 10/03/2007  . DEGENERATIVE JOINT DISEASE, KNEES, BILATERAL 10/03/2007  . HERNIORRHAPHY, HX OF 10/03/2007    CBC    Component Value Date/Time   WBC 6.0 11/14/2013 0846   RBC 3.50* 11/14/2013 0846   RBC 3.28* 04/28/2013 2115   HGB 10.4* 11/14/2013 0846   HCT 32.6* 11/14/2013 0846   PLT 230 11/14/2013 0846   MCV 93.1 11/14/2013 0846   LYMPHSABS 1.1 11/14/2013 0846   MONOABS 0.6 11/14/2013 0846   EOSABS 0.1 11/14/2013 0846   BASOSABS 0.0 11/14/2013 0846    CMP     Component Value Date/Time   NA 135* 11/14/2013 0846   K 4.9 11/14/2013 0846   CL 100 11/14/2013 0846   CO2 23 11/14/2013 0846   GLUCOSE 98 11/14/2013 0846   BUN 39* 11/14/2013 0846   CREATININE 1.16 11/14/2013 0846   CALCIUM 9.5 11/14/2013 0846   PROT 5.8* 05/03/2013 0445   ALBUMIN 2.3* 05/03/2013 0445   AST 15 05/03/2013 0445   ALT 6 05/03/2013 0445   ALKPHOS 46 05/03/2013 0445   BILITOT 0.3 05/03/2013 0445   GFRNONAA 52* 11/14/2013 0846   GFRAA 60* 11/14/2013 0846    Assessment and Plan  Anemia Pt was seen for Hb drop in 2 weeks from 10.0 to 7.2.  The repeat CBC today shows Hb 7.7 so not worse. Iron panel showed iron low at 41, % sat 19- low, ferritin 38 - low normal. Recheck CBC in a week.  Hematuria Has cleared    Margit HanksAnne D Alexander, MD

## 2013-11-25 NOTE — Assessment & Plan Note (Signed)
Has cleared

## 2013-11-25 NOTE — Assessment & Plan Note (Signed)
Pt was seen for Hb drop in 2 weeks from 10.0 to 7.2.  The repeat CBC today shows Hb 7.7 so not worse. Iron panel showed iron low at 41, % sat 19- low, ferritin 38 - low normal. Recheck CBC in a week.

## 2013-12-10 ENCOUNTER — Non-Acute Institutional Stay (SKILLED_NURSING_FACILITY): Payer: Medicare Other | Admitting: Nurse Practitioner

## 2013-12-10 DIAGNOSIS — D649 Anemia, unspecified: Secondary | ICD-10-CM

## 2013-12-10 DIAGNOSIS — Z978 Presence of other specified devices: Secondary | ICD-10-CM

## 2013-12-10 DIAGNOSIS — Z9889 Other specified postprocedural states: Secondary | ICD-10-CM

## 2013-12-10 DIAGNOSIS — K5900 Constipation, unspecified: Secondary | ICD-10-CM

## 2013-12-10 DIAGNOSIS — R339 Retention of urine, unspecified: Secondary | ICD-10-CM

## 2013-12-10 DIAGNOSIS — I10 Essential (primary) hypertension: Secondary | ICD-10-CM

## 2013-12-10 DIAGNOSIS — Z960 Presence of urogenital implants: Secondary | ICD-10-CM

## 2013-12-10 NOTE — Progress Notes (Signed)
Patient ID: [name] Bennett, male   DOB: [birth year], 80 y.o.   MRN: [id number]    Nursing Home Location:  [facility]   Place of Service: SNF (31)  PCP: Adella Hare, MD  No Known Allergies  Chief Complaint  Patient presents with  . Medical Management of Chronic Issues    HPI:  Patient is 80 y.o. male with PMH of protein calorie-malnutrition, anemia, urinary retention, constipation, hypertension who is being seen today for routine follow up on chronic conditions. In the past month pt has been seen and treated for worsening anemia with increase in iron, hgb stable without side effects from iron. Also, pts foley had dc'd and he was voiding without difficulty however it has been re-inserted per urology and now is long term. Would care nurse following open area on ankle without change or infection at this time. Pt has no complaints and reports he is doing well.   Review of Systems:  Review of Systems  Constitutional: Negative for fever and chills.  Respiratory: Negative for cough and shortness of breath.   Cardiovascular: Negative for chest pain and palpitations.  Gastrointestinal: Negative for heartburn, abdominal pain, diarrhea, constipation and blood in stool.  Genitourinary: Negative for dysuria, urgency and frequency.  Musculoskeletal: Negative for myalgias.  Skin: Negative for itching and rash.  Neurological: Negative for dizziness and headaches.  Psychiatric/Behavioral: Negative for depression. The patient is not nervous/anxious.      Past Medical History  Diagnosis Date  . Hypertension   . Urinary retention   . H/O total hip arthroplasty 05/01/2013   Past Surgical History  Procedure Laterality Date  . Fracture surgery    . Hip surgery    . Hip arthroplasty Left 05/01/2013    Procedure: ARTHROPLASTY BIPOLAR HIP;  Surgeon: Mauri Pole, MD;  Location: [facility];  Service: Orthopedics;  Laterality: Left;   Social History:   reports that he has never  smoked. He does not have any smokeless tobacco history on file. He reports that he does not drink alcohol or use illicit drugs.  No family history on file.  Medications: Patient's Medications  New Prescriptions   No medications on file  Previous Medications   FEEDING SUPPLEMENT (ENSURE COMPLETE) LIQD    Take 237 mLs by mouth 3 (three) times daily with meals.   HYDROCODONE-ACETAMINOPHEN (NORCO/VICODIN) 5-325 MG PER TABLET    Take 1-2 tablets by mouth every 6 (six) hours as needed for moderate pain.   MAGNESIUM CITRATE SOLN    Take 1 Bottle by mouth daily as needed for mild constipation or severe constipation.  Modified Medications   Modified Medication Previous Medication   ACETAMINOPHEN (TYLENOL) 325 MG TABLET acetaminophen (TYLENOL) 325 MG tablet      Take 650 mg by mouth every 6 (six) hours as needed for mild pain or moderate pain.    Take 2 tablets (650 mg total) by mouth every 6 (six) hours as needed.   FERROUS SULFATE 325 (65 FE) MG TABLET ferrous sulfate 325 (65 FE) MG tablet      Take 325 mg by mouth 2 (two) times daily. For anemia    Take 1 tablet (325 mg total) by mouth 2 (two) times daily.   LOSARTAN (COZAAR) 50 MG TABLET losartan (COZAAR) 50 MG tablet      Take 50 mg by mouth daily. For HTN    Take 1 tablet (50 mg total) by mouth daily.   MAGNESIUM CITRATE SOLN magnesium citrate SOLN  Take 1 Bottle by mouth daily as needed for mild constipation.    Take 296 mLs (1 Bottle total) by mouth daily as needed.   POLYETHYLENE GLYCOL (MIRALAX / GLYCOLAX) PACKET polyethylene glycol (MIRALAX / GLYCOLAX) packet      Take 17 g by mouth daily. For constipation    Take 17 g by mouth daily.  Discontinued Medications   No medications on file     Physical Exam:  Filed Vitals:   12/10/13 1408  BP: 125/65  Pulse: 78  Temp: 98 F (36.7 C)  Resp: 20  Weight: 130 lb (58.968 kg)   Physical Exam  Vitals reviewed. Constitutional: He is oriented to person, place, and time.  Thin male,  in NAD who is pleasant   HENT:  Head: Normocephalic and atraumatic.  Right Ear: External ear normal.  Left Ear: External ear normal.  Nose: Nose normal.  Mouth/Throat: Oropharynx is clear and moist. No oropharyngeal exudate.  Neck: Normal range of motion. Neck supple.  Cardiovascular: Normal rate, regular rhythm and normal heart sounds.   Pulmonary/Chest: Effort normal and breath sounds normal. No respiratory distress.  Abdominal: Soft. Bowel sounds are normal. He exhibits no distension.  Genitourinary:  Foley catheter draining clear yellow urine   Musculoskeletal: Normal range of motion. He exhibits no edema and no tenderness.  Neurological: He is alert and oriented to person, place, and time.  Skin: Skin is warm and dry. No erythema.  dsg to anterior ankle, wound unchanged   Psychiatric: Affect normal.      Labs reviewed: Basic Metabolic Panel:  Recent Labs  04/29/13 1736 05/03/13 0445 11/14/13 0846  NA 134* 131* 135*  K 4.1 5.0 4.9  CL 102 100 100  CO2 _0 GLUCOSE 86 86 98  BUN 21 17 39*  CREATININE 0.96 1.16 1.16  CALCIUM 9.4 9.0 9.5   Liver Function Tests:  Recent Labs  04/28/13 1845 04/29/13 1736 05/03/13 0445  AST _1 ALT _2 ALKPHOS 59 50 46  BILITOT 0.6 0.8 0.3  PROT 7.6 6.5 5.8*  ALBUMIN 3.9 3.1* 2.3*   No results found for this basename: LIPASE, AMYLASE,  in the last 8760 hours No results found for this basename: AMMONIA,  in the last 8760 hours CBC:  Recent Labs  04/28/13 1845 04/28/13 1900 04/29/13 0450 11/14/13 0846  WBC 8.7  --  6.5 6.0  NEUTROABS 7.8*  --   --  4.2  HGB 10.0* 10.9* 9.0* 10.4*  HCT 30.7* 32.0* 26.9* 32.6*  MCV 90.0  --  89.7 93.1  PLT 185  --  182 230   Cardiac Enzymes:  Recent Labs  04/28/13 1845 04/28/13 2115  CKTOTAL  --  297*  TROPONINI <0.30  --    iron and IBC  Result: 11/09/2013 3:04 PM ( Status: F )  Iron 41 L 42-165 ug/dL SLN  UIBC 179 125-400 ug/dL SLN  TIBC 220 215-435 ug/dL  SLN  %SAT 19 L 20-55 % SLN  CBC NO Diff (Complete Blood Count)  Result: 11/09/2013 2:31 PM ( Status: F )  WBC 5.1 4.0-10.5 K/uL SLN  RBC 3.42 L 4.22-5.81 MIL/uL SLN  Hemoglobin 10.0 L 13.0-17.0 g/dL SLN  Hematocrit 29.8 L 39.0-52.0 % SLN  MCV 87.1 78.0-100.0 fL SLN  MCH 29.2 26.0-34.0 pg SLN  MCHC 33.6 30.0-36.0 g/dL SLN  RDW 16.8 H 11.5-15.5 % SLN  Platelet Count 257 150-400 K/uL SLN  Ferritin  Result: 11/09/2013 2:47 PM (  Status: F )  Ferritin 38 22-322 ng/mL SLN  CBC with Diff  Result: 11/16/2013 12:22 PM ( Status: F ) C  WBC 4.9 4.0-10.5 K/uL SLN  RBC 2.49 L 4.22-5.81 MIL/uL SLN  Hemoglobin 7.2 L 13.0-17.0 g/dL SLN  Hematocrit 21.8 L 39.0-52.0 % SLN  MCV 87.6 78.0-100.0 fL SLN  MCH 28.9 26.0-34.0 pg SLN  MCHC 33.0 30.0-36.0 g/dL SLN  RDW 16.1 H 11.5-15.5 % SLN  Platelet Count 206 150-400 K/uL SLN  Granulocyte % 58 43-77 % SLN  Absolute Gran 2.8 1.7-7.7 K/uL SLN  Lymph % 26 12-46 % SLN  Absolute Lymph 1.3 0.7-4.0 K/uL SLN  Mono % 12 3-12 % SLN  Absolute Mono 0.6 0.1-1.0 K/uL SLN  Eos % 4 0-5 % SLN  Absolute Eos 0.2 0.0-0.7 K/uL SLN  Baso % 0 0-1 % SLN  Absolute Baso 0.0 0.0-0.1 K/uL SLN  CBC with Diff    Result: 12/03/2013 2:42 PM   ( Status: F )     C WBC 4.8     4.0-10.5 K/uL SLN   RBC 3.01   L 4.22-5.81 MIL/uL SLN   Hemoglobin 9.0   L 13.0-17.0 g/dL SLN   Hematocrit 26.6   L 39.0-52.0 % SLN   MCV 88.4     78.0-100.0 fL SLN   MCH 29.9     26.0-34.0 pg SLN   MCHC 33.8     30.0-36.0 g/dL SLN   RDW 15.8   H 11.5-15.5 % SLN   Platelet Count 286     150-400 K/uL SLN   Granulocyte % 58     43-77 % SLN   Absolute Gran 2.8     1.7-7.7 K/uL SLN   Lymph % 28     12-46 % SLN   Absolute Lymph 1.3     0.7-4.0 K/uL SLN   Mono % 10     3-12 % SLN   Absolute Mono 0.5     0.1-1.0 K/uL SLN   Eos % 4     0-5 % SLN   Absolute Eos 0.2     0.0-0.7 K/uL SLN   Baso % 0     0-1 % SLN   Absolute Baso 0.0     0.0-0.1 K/uL SLN   Smear Review Criteria for review not met  SLN   Basic  Metabolic Panel    Result: 12/03/2013 6:43 PM   ( Status: F )       Sodium 138     135-145 mEq/L SLN   Potassium 4.6     3.5-5.3 mEq/L SLN   Chloride 106     96-112 mEq/L SLN   CO2 23     19-32 mEq/L SLN   Glucose 72     70-99 mg/dL SLN   BUN 36   H 6-23 mg/dL SLN   Creatinine 0.98     0.50-1.35 mg/dL SLN   Calcium 9.3     8.4-10.5 mg/dL SLN   Lipid Profile    Result: 12/03/2013 6:43 PM   ( Status: F )       Cholesterol 127     0-200 mg/dL SLN C Triglyceride 32     <150 mg/dL SLN   HDL Cholesterol 46     >39 mg/dL SLN   Total Chol/HDL Ratio 2.8      Ratio SLN   VLDL Cholesterol (Calc) 6     0-40 mg/dL SLN   LDL Cholesterol (Calc) 75     0-99  mg/dL SLN   Assessment/Plan 1. Anemia -improved, conts on iron which he is tolerating well   2. Chronic indwelling Foley catheter -without adverse effects, being followed by urology  3. Unspecified constipation -stable on current medications  4. HYPERTENSION, MILD -well controlled on cozaar  5. Urinary retention -with chronic foley

## 2014-01-14 ENCOUNTER — Non-Acute Institutional Stay (SKILLED_NURSING_FACILITY): Payer: Medicare Other | Admitting: Nurse Practitioner

## 2014-01-14 DIAGNOSIS — M79609 Pain in unspecified limb: Secondary | ICD-10-CM

## 2014-01-14 DIAGNOSIS — M79605 Pain in left leg: Secondary | ICD-10-CM

## 2014-01-14 DIAGNOSIS — E43 Unspecified severe protein-calorie malnutrition: Secondary | ICD-10-CM

## 2014-01-14 DIAGNOSIS — K5900 Constipation, unspecified: Secondary | ICD-10-CM

## 2014-01-14 DIAGNOSIS — R339 Retention of urine, unspecified: Secondary | ICD-10-CM

## 2014-01-14 DIAGNOSIS — D649 Anemia, unspecified: Secondary | ICD-10-CM

## 2014-01-14 NOTE — Progress Notes (Signed)
Patient ID: [name] Bennett, male   DOB: Jan 04, 1918, 80 y.o.   MRN: [id number]    Nursing Home Location:  [facility]   Place of Service: SNF (31)  PCP: Adella Hare, MD  No Known Allergies  Chief Complaint  Patient presents with  . Medical Management of Chronic Issues    Routine Visit    HPI:  Patient is 80 y.o. male with PMH of protein calorie-malnutrition, anemia, urinary retention, constipation, hypertension who is being seen today for routine follow up on chronic conditions. nurse following open area on ankle which is healing, pt is without complaints and reports he is doing well. Denies pain except for an occasionally left leg/foot pain that he notices occasionally at night but it will not last long and if needed pain medication helps   Review of Systems:  Review of Systems  Constitutional: Negative for fever and chills.  Respiratory: Negative for cough, hemoptysis and shortness of breath.   Cardiovascular: Negative for chest pain and palpitations.  Gastrointestinal: Negative for heartburn, abdominal pain, diarrhea, constipation, blood in stool and melena.  Genitourinary: Negative for hematuria.       Foley catheter   Musculoskeletal: Negative for myalgias.  Skin: Negative for itching and rash.  Neurological: Negative for dizziness and headaches.     Past Medical History  Diagnosis Date  . Hypertension   . Urinary retention   . H/O total hip arthroplasty 05/01/2013   Past Surgical History  Procedure Laterality Date  . Fracture surgery    . Hip surgery    . Hip arthroplasty Left 05/01/2013    Procedure: ARTHROPLASTY BIPOLAR HIP;  Surgeon: Mauri Pole, MD;  Location: [facility];  Service: Orthopedics;  Laterality: Left;   Social History:   reports that he has never smoked. He does not have any smokeless tobacco history on file. He reports that he does not drink alcohol or use illicit drugs.  No family history on file.  Medications: Patient's  Medications  New Prescriptions   No medications on file  Previous Medications   ACETAMINOPHEN (TYLENOL) 325 MG TABLET    Take 650 mg by mouth every 6 (six) hours as needed for mild pain or moderate pain.   FEEDING SUPPLEMENT (ENSURE COMPLETE) LIQD    Take 237 mLs by mouth 3 (three) times daily with meals.   FERROUS SULFATE 325 (65 FE) MG TABLET    Take 325 mg by mouth 2 (two) times daily. For anemia   HYDROCODONE-ACETAMINOPHEN (NORCO/VICODIN) 5-325 MG PER TABLET    Take 1-2 tablets by mouth every 6 (six) hours as needed for moderate pain.   LOSARTAN (COZAAR) 50 MG TABLET    Take 50 mg by mouth daily. For HTN   MAGNESIUM CITRATE SOLN    Take 1 Bottle by mouth daily as needed for mild constipation.   POLYETHYLENE GLYCOL (MIRALAX / GLYCOLAX) PACKET    Take 17 g by mouth daily. For constipation  Modified Medications   No medications on file  Discontinued Medications   MAGNESIUM CITRATE SOLN    Take 1 Bottle by mouth daily as needed for mild constipation or severe constipation.     Physical Exam:  Filed Vitals:   01/14/14 1452  BP: 120/66  Pulse: 52  Temp: 98.4 F (36.9 C)  TempSrc: Oral  Resp: 16  Weight: 134 lb 9.6 oz (61.054 kg)   Physical Exam  Vitals reviewed. Constitutional: He is oriented to person, place, and time.  Thin male in  NAD   HENT:  Head: Normocephalic and atraumatic.  Nose: Nose normal.  Mouth/Throat: Oropharynx is clear and moist. No oropharyngeal exudate.  Neck: Normal range of motion. Neck supple.  Cardiovascular: Normal rate, regular rhythm and normal heart sounds.   Pulmonary/Chest: Effort normal and breath sounds normal.  Abdominal: Soft. Bowel sounds are normal. He exhibits no distension.  Genitourinary:  Foley catheter draining clear yellow urine   Musculoskeletal: Normal range of motion. He exhibits no edema and no tenderness.  Neurological: He is alert and oriented to person, place, and time.  Skin: Skin is warm and dry. No erythema.  wound  improved  Psychiatric: Affect normal.      Labs reviewed: Basic Metabolic Panel:  Recent Labs  04/29/13 1736 05/03/13 0445 11/14/13 0846  NA 134* 131* 135*  K 4.1 5.0 4.9  CL 102 100 100  CO2 23 24 23   GLUCOSE 86 86 98  BUN 21 17 39*  CREATININE 0.96 1.16 1.16  CALCIUM 9.4 9.0 9.5   Liver Function Tests:  Recent Labs  04/28/13 1845 04/29/13 1736 05/03/13 0445  AST 21 16 15   ALT 11 9 6   ALKPHOS 59 50 46  BILITOT 0.6 0.8 0.3  PROT 7.6 6.5 5.8*  ALBUMIN 3.9 3.1* 2.3*   No results found for this basename: LIPASE, AMYLASE,  in the last 8760 hours No results found for this basename: AMMONIA,  in the last 8760 hours CBC:  Recent Labs  04/28/13 1845 04/28/13 1900 04/29/13 0450 11/14/13 0846  WBC 8.7  --  6.5 6.0  NEUTROABS 7.8*  --   --  4.2  HGB 10.0* 10.9* 9.0* 10.4*  HCT 30.7* 32.0* 26.9* 32.6*  MCV 90.0  --  89.7 93.1  PLT 185  --  182 230   Cardiac Enzymes:  Recent Labs  04/28/13 1845 04/28/13 2115  CKTOTAL  --  297*  TROPONINI <0.30  --     iron and IBC  Result: 11/09/2013 3:04 PM ( Status: F )  Iron 41 L 42-165 ug/dL SLN  UIBC 179 125-400 ug/dL SLN  TIBC 220 215-435 ug/dL SLN  %SAT 19 L 20-55 % SLN  CBC NO Diff (Complete Blood Count)  Result: 11/09/2013 2:31 PM ( Status: F )  WBC 5.1 4.0-10.5 K/uL SLN  RBC 3.42 L 4.22-5.81 MIL/uL SLN  Hemoglobin 10.0 L 13.0-17.0 g/dL SLN  Hematocrit 29.8 L 39.0-52.0 % SLN  MCV 87.1 78.0-100.0 fL SLN  MCH 29.2 26.0-34.0 pg SLN  MCHC 33.6 30.0-36.0 g/dL SLN  RDW 16.8 H 11.5-15.5 % SLN  Platelet Count 257 150-400 K/uL SLN  Ferritin  Result: 11/09/2013 2:47 PM ( Status: F )  Ferritin 38 22-322 ng/mL SLN  CBC with Diff  Result: 11/16/2013 12:22 PM ( Status: F ) C  WBC 4.9 4.0-10.5 K/uL SLN  RBC 2.49 L 4.22-5.81 MIL/uL SLN  Hemoglobin 7.2 L 13.0-17.0 g/dL SLN  Hematocrit 21.8 L 39.0-52.0 % SLN  MCV 87.6 78.0-100.0 fL SLN  MCH 28.9 26.0-34.0 pg SLN  MCHC 33.0 30.0-36.0 g/dL SLN  RDW 16.1 H 11.5-15.5 %  SLN  Platelet Count 206 150-400 K/uL SLN  Granulocyte % 58 43-77 % SLN  Absolute Gran 2.8 1.7-7.7 K/uL SLN  Lymph % 26 12-46 % SLN  Absolute Lymph 1.3 0.7-4.0 K/uL SLN  Mono % 12 3-12 % SLN  Absolute Mono 0.6 0.1-1.0 K/uL SLN  Eos % 4 0-5 % SLN  Absolute Eos 0.2 0.0-0.7 K/uL SLN  Baso % 0 0-1 % SLN  Absolute Baso 0.0 0.0-0.1 K/uL SLN  CBC with Diff  Result: 12/03/2013 2:42 PM ( Status: F ) C  WBC 4.8 4.0-10.5 K/uL SLN  RBC 3.01 L 4.22-5.81 MIL/uL SLN  Hemoglobin 9.0 L 13.0-17.0 g/dL SLN  Hematocrit 26.6 L 39.0-52.0 % SLN  MCV 88.4 78.0-100.0 fL SLN  MCH 29.9 26.0-34.0 pg SLN  MCHC 33.8 30.0-36.0 g/dL SLN  RDW 15.8 H 11.5-15.5 % SLN  Platelet Count 286 150-400 K/uL SLN  Granulocyte % 58 43-77 % SLN  Absolute Gran 2.8 1.7-7.7 K/uL SLN  Lymph % 28 12-46 % SLN  Absolute Lymph 1.3 0.7-4.0 K/uL SLN  Mono % 10 3-12 % SLN  Absolute Mono 0.5 0.1-1.0 K/uL SLN  Eos % 4 0-5 % SLN  Absolute Eos 0.2 0.0-0.7 K/uL SLN  Baso % 0 0-1 % SLN  Absolute Baso 0.0 0.0-0.1 K/uL SLN  Smear Review Criteria for review not met SLN  Basic Metabolic Panel  Result: 03/04/657 6:43 PM ( Status: F )  Sodium 138 135-145 mEq/L SLN  Potassium 4.6 3.5-5.3 mEq/L SLN  Chloride 106 96-112 mEq/L SLN  CO2 23 19-32 mEq/L SLN  Glucose 72 70-99 mg/dL SLN  BUN 36 H 6-23 mg/dL SLN  Creatinine 0.98 0.50-1.35 mg/dL SLN  Calcium 9.3 8.4-10.5 mg/dL SLN  Lipid Profile  Result: 12/03/2013 6:43 PM ( Status: F )  Cholesterol 127 0-200 mg/dL SLN C  Triglyceride 32 <150 mg/dL SLN  HDL Cholesterol 46 >39 mg/dL SLN  Total Chol/HDL Ratio 2.8 Ratio SLN  VLDL Cholesterol (Calc) 6 0-40 mg/dL SLN  LDL Cholesterol (Calc) 75 0-99 mg/dL SLN  Assessment/Plan  1. Anemia, unspecified anemia type hgb was trending up, will follow up cbc and ferritin level   2. Urinary retention -with chronic foley catheter at this time, following with urology  3. Protein-calorie malnutrition, severe -pt is good PO intake, supplements and has had  positive weight gain   4. Left leg pain -occasionally at night, PRN relieves pain  5. Unspecified constipation -without current symptoms

## 2014-01-17 LAB — CBC AND DIFFERENTIAL
HCT: 29 % — AB (ref 41–53)
Hemoglobin: 9.7 g/dL — AB (ref 13.5–17.5)
Platelets: 207 10*3/uL (ref 150–399)
WBC: 4.9 10^3/mL

## 2014-02-04 LAB — BASIC METABOLIC PANEL
BUN: 37 mg/dL — AB (ref 4–21)
CREATININE: 1 mg/dL (ref <=1.3)
GLUCOSE: 81 mg/dL
POTASSIUM: 4.8 mmol/L (ref 3.4–5.3)
Sodium: 136 mmol/L — AB (ref 137–147)

## 2014-02-04 LAB — LIPID PANEL
Cholesterol: 125 mg/dL (ref 0–200)
HDL: 39 mg/dL (ref 35–70)
LDL Cholesterol: 75 mg/dL
LDl/HDL Ratio: 3.2
Triglycerides: 53 mg/dL (ref 40–160)

## 2014-02-18 ENCOUNTER — Encounter: Payer: Self-pay | Admitting: Nurse Practitioner

## 2014-02-18 ENCOUNTER — Non-Acute Institutional Stay (SKILLED_NURSING_FACILITY): Payer: Medicare Other | Admitting: Nurse Practitioner

## 2014-02-18 DIAGNOSIS — I10 Essential (primary) hypertension: Secondary | ICD-10-CM

## 2014-02-18 DIAGNOSIS — R339 Retention of urine, unspecified: Secondary | ICD-10-CM

## 2014-02-18 DIAGNOSIS — K5900 Constipation, unspecified: Secondary | ICD-10-CM

## 2014-02-18 NOTE — Progress Notes (Signed)
Patient ID: [name] Bennett, male   DOB: 1918-04-04, 80 y.o.   MRN: [id number]  Location:  [facility] Provider: Candelaria CelesteJessica  Karam, NP  Code Status:  DNR  Chief Complaint  Patient presents with  . Medical Management of Chronic Issues    HPI:  Patient is 80 y.o. male with PMH of protein calorie-malnutrition, anemia, urinary retention, constipation, hypertension who is being seen today for routine follow up on chronic conditions. Currently being followed by therapy for strength training, pt doing exercises as well. Pt without any complaints  Review of Systems:  Review of Systems  Constitutional: Negative for fever and chills.  Respiratory: Negative for cough, hemoptysis and shortness of breath.   Cardiovascular: Negative for chest pain and palpitations.  Gastrointestinal: Negative for heartburn, abdominal pain, diarrhea, constipation, blood in stool and melena.  Genitourinary: Negative for hematuria.       Foley catheter   Musculoskeletal: Negative for myalgias.  Skin: Negative for itching and rash.  Neurological: Negative for dizziness and headaches.    Medications: Patient's Medications  New Prescriptions   No medications on file  Previous Medications   ACETAMINOPHEN (TYLENOL) 325 MG TABLET    Take 650 mg by mouth every 6 (six) hours as needed for mild pain or moderate pain.   FEEDING SUPPLEMENT (ENSURE COMPLETE) LIQD    Take 237 mLs by mouth 3 (three) times daily with meals.   FERROUS SULFATE 325 (65 FE) MG TABLET    Take 325 mg by mouth 3 (three) times daily with meals. For anemia   HYDROCODONE-ACETAMINOPHEN (NORCO/VICODIN) 5-325 MG PER TABLET    Take 1-2 tablets by mouth every 6 (six) hours as needed for moderate pain.   LOSARTAN (COZAAR) 50 MG TABLET    Take 50 mg by mouth daily. For HTN   MAGNESIUM CITRATE SOLN    Take 1 Bottle by mouth daily as needed for mild constipation.   POLYETHYLENE GLYCOL (MIRALAX / GLYCOLAX) PACKET    Take 17 g by mouth daily. For constipation    Modified Medications   No medications on file  Discontinued Medications   No medications on file    Physical Exam: Filed Vitals:   02/18/14 1120  BP: 130/64  Pulse: 62  Temp: 98 F (36.7 C)  Resp: 20   Physical Exam  Vitals reviewed. Constitutional: He is oriented to person, place, and time.  Thin male in NAD   HENT:  Head: Normocephalic and atraumatic.  Nose: Nose normal.  Mouth/Throat: Oropharynx is clear and moist. No oropharyngeal exudate.  Neck: Normal range of motion. Neck supple.  Cardiovascular: Normal rate, regular rhythm and normal heart sounds.   Pulmonary/Chest: Effort normal and breath sounds normal.  Abdominal: Soft. Bowel sounds are normal. He exhibits no distension.  Genitourinary:  Foley catheter draining clear yellow urine   Musculoskeletal: Normal range of motion. He exhibits no edema and no tenderness.  Neurological: He is alert and oriented to person, place, and time.  Skin: Skin is warm and dry. No erythema.  Psychiatric: Affect normal.    Labs reviewed: Basic Metabolic Panel:  Recent Labs  78/29/5608/09/11 1736 05/03/13 0445 11/14/13 0846 02/04/14  NA 134* 131* 135* 136*  K 4.1 5.0 4.9 4.8  CL 102 100 100  --   CO2 23 24 23   --   GLUCOSE 86 86 98  --   BUN 21 17 39* 37*  CREATININE 0.96 1.16 1.16 1.0  CALCIUM 9.4 9.0 9.5  --     Liver  Function Tests:  Recent Labs  04/28/13 1845 04/29/13 1736 05/03/13 0445  AST 21 16 15   ALT 11 9 6   ALKPHOS 59 50 46  BILITOT 0.6 0.8 0.3  PROT 7.6 6.5 5.8*  ALBUMIN 3.9 3.1* 2.3*    CBC:  Recent Labs  04/28/13 1845  04/29/13 0450 11/14/13 0846 01/17/14  WBC 8.7  --  6.5 6.0 4.9  NEUTROABS 7.8*  --   --  4.2  --   HGB 10.0*  < > 9.0* 10.4* 9.7*  HCT 30.7*  < > 26.9* 32.6* 29*  MCV 90.0  --  89.7 93.1  --   PLT 185  --  182 230 207  < > = values in this interval not displayed.  Assessment/Plan 1. Unspecified constipation -controlled on current medications  2. HYPERTENSION, MILD Stable  on current medications  3. Urinary retention -conts with chronic foley catheter, no noted issues   4, Anemia Stable, will cont iron

## 2014-04-08 ENCOUNTER — Non-Acute Institutional Stay (SKILLED_NURSING_FACILITY): Payer: Medicare Other | Admitting: Nurse Practitioner

## 2014-04-08 ENCOUNTER — Encounter: Payer: Self-pay | Admitting: Nurse Practitioner

## 2014-04-08 DIAGNOSIS — R339 Retention of urine, unspecified: Secondary | ICD-10-CM

## 2014-04-08 DIAGNOSIS — K5900 Constipation, unspecified: Secondary | ICD-10-CM

## 2014-04-08 DIAGNOSIS — Z978 Presence of other specified devices: Secondary | ICD-10-CM

## 2014-04-08 DIAGNOSIS — I10 Essential (primary) hypertension: Secondary | ICD-10-CM

## 2014-04-08 DIAGNOSIS — Z960 Presence of urogenital implants: Secondary | ICD-10-CM

## 2014-04-08 DIAGNOSIS — Z9889 Other specified postprocedural states: Secondary | ICD-10-CM

## 2014-04-08 DIAGNOSIS — N390 Urinary tract infection, site not specified: Secondary | ICD-10-CM

## 2014-04-08 DIAGNOSIS — D649 Anemia, unspecified: Secondary | ICD-10-CM

## 2014-04-08 NOTE — Progress Notes (Signed)
Patient ID: [name] Bennett, male   DOB: 1918/03/28, 80 y.o.   MRN: [id number]  Location:  [facility] Provider: Candelaria Celeste, NP  Code Status:  DNR  Chief Complaint  Patient presents with  . Medical Management of Chronic Issues    HPI:  Patient is 80 y.o. male with PMH of protein calorie-malnutrition, anemia, urinary retention, constipation, hypertension who is being seen today for routine follow up on chronic conditions. Currently being followed by therapy for strength training, pt doing exercises as well. Now walking with a walker independently in room. Since living at [facility] pt reports increased po intake, weight now 147#, up ~20# since admission. Pt without any complaints.  Review of Systems:  Review of Systems  Constitutional: Negative for fever and chills.  Respiratory: Negative for cough, hemoptysis and shortness of breath.   Cardiovascular: Negative for chest pain and palpitations.  Gastrointestinal: Negative for heartburn, abdominal pain, diarrhea, constipation, blood in stool and melena.  Genitourinary: Negative for hematuria.       Foley catheter   Musculoskeletal: Negative for myalgias.  Skin: Negative for itching and rash.  Neurological: Negative for dizziness and headaches.  Psychiatric/Behavioral: Negative for depression. The patient is not nervous/anxious.        Cheerful, optimistic outlook    Medications: Patient's Medications  New Prescriptions   No medications on file  Previous Medications   ACETAMINOPHEN (TYLENOL) 325 MG TABLET    Take 650 mg by mouth every 6 (six) hours as needed for mild pain or moderate pain.   FEEDING SUPPLEMENT (ENSURE COMPLETE) LIQD    Take 237 mLs by mouth 3 (three) times daily with meals.   FERROUS SULFATE 325 (65 FE) MG TABLET    Take 325 mg by mouth 3 (three) times daily with meals. For anemia   HYDROCODONE-ACETAMINOPHEN (NORCO/VICODIN) 5-325 MG PER TABLET    Take 1-2 tablets by mouth every 6 (six) hours as needed for moderate  pain.   LOSARTAN (COZAAR) 50 MG TABLET    Take 50 mg by mouth daily. For HTN   MAGNESIUM CITRATE SOLN    Take 1 Bottle by mouth daily as needed for mild constipation.   POLYETHYLENE GLYCOL (MIRALAX / GLYCOLAX) PACKET    Take 17 g by mouth daily. For constipation  Modified Medications   No medications on file  Discontinued Medications   No medications on file    Physical Exam: Filed Vitals:   04/08/14 1149  BP: 138/70  Pulse: 80  Temp: 97 F (36.1 C)  Resp: 20  Weight: 147 lb (66.679 kg)   Physical Exam  Vitals reviewed. Constitutional: He is oriented to person, place, and time.  Thin male in NAD   HENT:  Head: Normocephalic and atraumatic.  Nose: Nose normal.  Mouth/Throat: Oropharynx is clear and moist. No oropharyngeal exudate.  Neck: Normal range of motion. Neck supple.  Cardiovascular: Normal rate, regular rhythm and normal heart sounds.   Pulmonary/Chest: Effort normal and breath sounds normal.  Abdominal: Soft. Bowel sounds are normal. He exhibits no distension.  Genitourinary:  Foley catheter draining clear yellow urine   Musculoskeletal: Normal range of motion. He exhibits no edema and no tenderness.  Neurological: He is alert and oriented to person, place, and time.  Skin: Skin is warm and dry. No erythema.  Psychiatric: Mood and affect normal.    Labs reviewed: Basic Metabolic Panel:  Recent Labs  40/98/11 1736 05/03/13 0445 11/14/13 0846 02/04/14  NA 134* 131* 135* 136*  K 4.1  5.0 4.9 4.8  CL 102 100 100  --   CO2 --   GLUCOSE 86 86 98  --   BUN 21 17 39* 37*  CREATININE 0.96 1.16 1.16 1.0  CALCIUM 9.4 9.0 9.5  --     Liver Function Tests:  Recent Labs  04/28/13 1845 04/29/13 1736 05/03/13 0445  AST ALT ALKPHOS 59 50 46  BILITOT 0.6 0.8 0.3  PROT 7.6 6.5 5.8*  ALBUMIN 3.9 3.1* 2.3*    CBC:  Recent Labs  04/28/13 1845  04/29/13 0450 11/14/13 0846 01/17/14  WBC 8.7  --  6.5 6.0 4.9  NEUTROABS 7.8*   --   --  4.2  --   HGB 10.0*  < > 9.0* 10.4* 9.7*  HCT 30.7*  < > 26.9* 32.6* 29*  MCV 90.0  --  89.7 93.1  --   PLT 185  --  182 230 207  < > = values in this interval not displayed. Basic Metabolic Panel    Result: 02/04/2014 11:06 PM   ( Status: F )     C Sodium 136     135-145 mEq/L SLN   Potassium 4.8     3.5-5.3 mEq/L SLN   Chloride 104     96-112 mEq/L SLN   CO2 25     19-32 mEq/L SLN   Glucose 81     70-99 mg/dL SLN   BUN 37   H 9-14 mg/dL SLN   Creatinine 7.82     0.50-1.35 mg/dL SLN   Calcium 9.2     9.5-62.1 mg/dL SLN   Lipid Profile    Result: 02/04/2014 11:06 PM   ( Status: F )       Cholesterol 125     0-200 mg/dL SLN C Triglyceride 53     <150 mg/dL SLN   HDL Cholesterol 39   L >39 mg/dL SLN   Total Chol/HDL Ratio 3.2      Ratio SLN   VLDL Cholesterol (Calc) 11     0-40 mg/dL SLN   LDL Cholesterol (Calc) 75      Assessment/Plan 1. HYPERTENSION, MILD Stable on current medications   2. Unspecified constipation -controlled on current medications  3. Urinary retention -conts with chronic foley catheter, no noted issues   4. Chronic indwelling Foley catheter Stable, clear yellow urine  5. Anemia, unspecified anemia type Stable, will cont iron

## 2014-04-28 ENCOUNTER — Non-Acute Institutional Stay (SKILLED_NURSING_FACILITY): Payer: Medicare Other | Admitting: Internal Medicine

## 2014-04-28 ENCOUNTER — Encounter: Payer: Self-pay | Admitting: Internal Medicine

## 2014-04-28 DIAGNOSIS — Z9889 Other specified postprocedural states: Secondary | ICD-10-CM

## 2014-04-28 DIAGNOSIS — R339 Retention of urine, unspecified: Secondary | ICD-10-CM

## 2014-04-28 DIAGNOSIS — K5904 Chronic idiopathic constipation: Secondary | ICD-10-CM

## 2014-04-28 DIAGNOSIS — I10 Essential (primary) hypertension: Secondary | ICD-10-CM

## 2014-04-28 DIAGNOSIS — D509 Iron deficiency anemia, unspecified: Secondary | ICD-10-CM

## 2014-04-28 DIAGNOSIS — Z960 Presence of urogenital implants: Secondary | ICD-10-CM

## 2014-04-28 DIAGNOSIS — K5909 Other constipation: Secondary | ICD-10-CM

## 2014-04-28 DIAGNOSIS — Z978 Presence of other specified devices: Secondary | ICD-10-CM

## 2014-04-28 NOTE — Assessment & Plan Note (Signed)
Time for follow up lab; ordered today

## 2014-04-28 NOTE — Assessment & Plan Note (Signed)
No recent infections.

## 2014-04-28 NOTE — Assessment & Plan Note (Signed)
Chronic foley; no recent infections

## 2014-04-28 NOTE — Assessment & Plan Note (Signed)
No recent problems  °

## 2014-04-28 NOTE — Progress Notes (Signed)
MRN: [id number] Name: [name] Bennett L Gitto  Sex: male Age: 80 y.o. DOB: Dec 29, 1917  [facility]B Level Of Care: SNF Provider: Merrilee SeashoreALEXANDER, Bocephus Cali D Emergency Contacts: Extended Emergency Contact Information Primary Emergency Contact: Beaulah DinningGuy,Ethna  [place] Home Phone: [phone] Relation: Niece Secondary Emergency Contact: Duffy,Beverly  [place] Home Phone: [phone] Relation: Niece  Code Status:Full   Allergies: Review of patient's allergies indicates no known allergies.  Chief Complaint  Patient presents with  . Medical Management of Chronic Issues    HPI: Patient is 80 y.o. male who is being seen for routine problems.  Past Medical History  Diagnosis Date  . Hypertension   . Urinary retention   . H/O total hip arthroplasty 05/01/2013    Past Surgical History  Procedure Laterality Date  . Fracture surgery    . Hip surgery    . Hip arthroplasty Left 05/01/2013    Procedure: ARTHROPLASTY BIPOLAR HIP;  Surgeon: Shelda PalMatthew D Olin, MD;  Location: [facility];  Service: Orthopedics;  Laterality: Left;      Medication List       This list is accurate as of: 04/28/14  3:19 PM.  Always use your most recent med list.               acetaminophen 325 MG tablet  Commonly known as:  TYLENOL  Take 650 mg by mouth every 6 (six) hours as needed for mild pain or moderate pain.     feeding supplement (ENSURE COMPLETE) Liqd  Take 237 mLs by mouth 3 (three) times daily with meals.     ferrous sulfate 325 (65 FE) MG tablet  Take 325 mg by mouth 3 (three) times daily with meals. For anemia     HYDROcodone-acetaminophen 5-325 MG per tablet  Commonly known as:  NORCO/VICODIN  Take 1-2 tablets by mouth every 6 (six) hours as needed for moderate pain.     losartan 50 MG tablet  Commonly known as:  COZAAR  Take 50 mg by mouth daily. For HTN     magnesium citrate Soln  Take 1 Bottle by mouth daily as needed for mild constipation.      polyethylene glycol packet  Commonly known as:  MIRALAX / GLYCOLAX  Take 17 g by mouth daily. For constipation        No orders of the defined types were placed in this encounter.    Immunization History  Administered Date(s) Administered  . PPD Test 07/17/2013    History  Substance Use Topics  . Smoking status: Never Smoker   . Smokeless tobacco: Not on file  . Alcohol Use: No    Review of Systems  DATA OBTAINED: from patient;no c/o , doing very well GENERAL:  no fevers, fatigue, appetite changes SKIN: No itching, rash HEENT: No complaint RESPIRATORY: No cough, wheezing, SOB CARDIAC: No chest pain, palpitations, lower extremity edema  GI: No abdominal pain, No N/V/D or constipation, No heartburn or reflux  GU: No dysuria, frequency or urgency, or incontinence  MUSCULOSKELETAL: No unrelieved bone/joint pain NEUROLOGIC: No headache, dizziness  PSYCHIATRIC: No overt anxiety or sadness  Filed Vitals:   04/28/14 1458  BP: 142/68  Pulse: 76  Temp: 97.4 F (36.3 C)  Resp: 18    Physical Exam  GENERAL APPEARANCE: Alert, conversant, No acute distress  SKIN: No diaphoresis rash HEENT: Unremarkable RESPIRATORY: Breathing is even, unlabored. Lung sounds are clear   CARDIOVASCULAR: Heart RRR no murmurs, rubs or gallops. No peripheral  edema  GASTROINTESTINAL: Abdomen is soft, non-tender, not distended w/ normal bowel sounds.  GENITOURINARY: Bladder non tender, not distended;foley in place  MUSCULOSKELETAL: No abnormal joints or musculature NEUROLOGIC: Cranial nerves 2-12 grossly intact. Moves all extremities PSYCHIATRIC: Mood and affect appropriate to situation, no behavioral issues  Patient Active Problem List   Diagnosis Date Noted  . Chronic indwelling Foley catheter 07/12/2013  . H/O total hip arthroplasty   . Protein-calorie malnutrition, severe 04/30/2013  . Weakness generalized 04/28/2013  . Left leg pain 04/28/2013  . Hematuria 09/15/2011  . UTI  (lower urinary tract infection) 09/15/2011  . Anemia 09/15/2011  . Constipation - functional 09/15/2011  . Urinary retention 09/15/2011  . WEIGHT LOSS 07/27/2009  . HIP FRACTURE, RIGHT 01/22/2008  . Essential hypertension, benign 10/03/2007  . DEGENERATIVE JOINT DISEASE, KNEES, BILATERAL 10/03/2007  . HERNIORRHAPHY, HX OF 10/03/2007    CBC    Component Value Date/Time   WBC 4.9 01/17/2014   WBC 6.0 11/14/2013 0846   RBC 3.50* 11/14/2013 0846   RBC 3.28* 04/28/2013 2115   HGB 9.7* 01/17/2014   HCT 29* 01/17/2014   PLT 207 01/17/2014   MCV 93.1 11/14/2013 0846   LYMPHSABS 1.1 11/14/2013 0846   MONOABS 0.6 11/14/2013 0846   EOSABS 0.1 11/14/2013 0846   BASOSABS 0.0 11/14/2013 0846    CMP     Component Value Date/Time   NA 136* 02/04/2014   NA 135* 11/14/2013 0846   K 4.8 02/04/2014   CL 100 11/14/2013 0846   CO2 23 11/14/2013 0846   GLUCOSE 98 11/14/2013 0846   BUN 37* 02/04/2014   BUN 39* 11/14/2013 0846   CREATININE 1.0 02/04/2014   CREATININE 1.16 11/14/2013 0846   CALCIUM 9.5 11/14/2013 0846   PROT 5.8* 05/03/2013 0445   ALBUMIN 2.3* 05/03/2013 0445   AST 15 05/03/2013 0445   ALT 6 05/03/2013 0445   ALKPHOS 46 05/03/2013 0445   BILITOT 0.3 05/03/2013 0445   GFRNONAA 52* 11/14/2013 0846   GFRAA 60* 11/14/2013 0846    Assessment and Plan  Essential hypertension, benign No longer on lasix;inc losartan to 100 mg po daily  Urinary retention Chronic foley; no recent infections  Anemia Time for follow up lab; ordered today  Constipation - functional No recent problems  Chronic indwelling Foley catheter No recent infections    Margit Hanks, MD

## 2014-04-28 NOTE — Assessment & Plan Note (Signed)
No longer on lasix;inc losartan to 100 mg po daily

## 2014-06-10 ENCOUNTER — Non-Acute Institutional Stay (SKILLED_NURSING_FACILITY): Payer: Medicare Other | Admitting: Nurse Practitioner

## 2014-06-10 DIAGNOSIS — K5904 Chronic idiopathic constipation: Secondary | ICD-10-CM

## 2014-06-10 DIAGNOSIS — D509 Iron deficiency anemia, unspecified: Secondary | ICD-10-CM

## 2014-06-10 DIAGNOSIS — J189 Pneumonia, unspecified organism: Secondary | ICD-10-CM

## 2014-06-10 DIAGNOSIS — I10 Essential (primary) hypertension: Secondary | ICD-10-CM

## 2014-06-10 DIAGNOSIS — K5909 Other constipation: Secondary | ICD-10-CM

## 2014-06-10 NOTE — Progress Notes (Signed)
Patient ID: [name] Bennett, male   DOB: Jul 09, 1918, 80 y.o.   MRN: [id number]    Nursing Home Location:  [facility]   Place of Service: SNF (31)  PCP: Margit Hanks, MD  No Known Allergies  Chief Complaint  Patient presents with  . Medical Management of Chronic Issues    HPI:  Patient is a 80 y.o. male seen today at [facility] for routine follow up on chronic conditions. Pt with PMH of protein calorie-malnutrition, anemia, urinary retention, constipation, hypertension. Pt is currently being treated for pneumonia with Levaquin. Pt was complaining of cough and congestion earlier this week but this has resolved. Pt reports he has no complaints. No shortness of breath, no cough. Good appetite.   Review of Systems:  Review of Systems  Constitutional: Negative for activity change, appetite change, fatigue and unexpected weight change.  HENT: Negative for congestion and hearing loss.   Eyes: Negative.   Respiratory: Negative for cough and shortness of breath.   Cardiovascular: Negative for chest pain, palpitations and leg swelling.  Gastrointestinal: Negative for abdominal pain, diarrhea and constipation.  Genitourinary: Negative for dysuria and difficulty urinating.  Musculoskeletal: Negative for myalgias and arthralgias.  Skin: Negative for color change and wound.  Neurological: Negative for dizziness and weakness.  Psychiatric/Behavioral: Negative for behavioral problems, confusion and agitation.    Past Medical History  Diagnosis Date  . Hypertension   . Urinary retention   . H/O total hip arthroplasty 05/01/2013   Past Surgical History  Procedure Laterality Date  . Fracture surgery    . Hip surgery    . Hip arthroplasty Left 05/01/2013    Procedure: ARTHROPLASTY BIPOLAR HIP;  Surgeon: Shelda Pal, MD;  Location: [facility];  Service: Orthopedics;  Laterality: Left;   Social History:   reports that he has never smoked. He does not have  any smokeless tobacco history on file. He reports that he does not drink alcohol or use illicit drugs.  No family history on file.  Medications: Patient's Medications  New Prescriptions   No medications on file  Previous Medications   ACETAMINOPHEN (TYLENOL) 325 MG TABLET    Take 650 mg by mouth every 6 (six) hours as needed for mild pain or moderate pain.   FEEDING SUPPLEMENT (ENSURE COMPLETE) LIQD    Take 237 mLs by mouth 3 (three) times daily with meals.   FERROUS SULFATE 325 (65 FE) MG TABLET    Take 325 mg by mouth 3 (three) times daily with meals. For anemia   HYDROCODONE-ACETAMINOPHEN (NORCO/VICODIN) 5-325 MG PER TABLET    Take 1-2 tablets by mouth every 6 (six) hours as needed for moderate pain.   LOSARTAN (COZAAR) 50 MG TABLET    Take 50 mg by mouth daily. For HTN   MAGNESIUM CITRATE SOLN    Take 1 Bottle by mouth daily as needed for mild constipation.   POLYETHYLENE GLYCOL (MIRALAX / GLYCOLAX) PACKET    Take 17 g by mouth daily. For constipation  Modified Medications   No medications on file  Discontinued Medications   No medications on file     Physical Exam: Filed Vitals:   06/10/14 1421  BP: 128/66  Pulse: 78  Temp: 97.5 F (36.4 C)  Resp: 18    Physical Exam  Constitutional: He is oriented to person, place, and time. No distress.  Thin male in NAD  HENT:  Head: Normocephalic and atraumatic.  Mouth/Throat: Oropharynx is clear and  moist. No oropharyngeal exudate.  Eyes: Conjunctivae and EOM are normal. Pupils are equal, round, and reactive to light.  Neck: Normal range of motion. Neck supple.  Cardiovascular: Normal rate, regular rhythm and normal heart sounds.   Pulmonary/Chest: Effort normal and breath sounds normal.  Abdominal: Soft. Bowel sounds are normal.  Genitourinary:  With chronic foley catheter   Musculoskeletal: He exhibits no edema or tenderness.  Neurological: He is alert and oriented to person, place, and time.  Skin: Skin is warm and dry. He  is not diaphoretic.  Psychiatric: He has a normal mood and affect.    Labs reviewed: Basic Metabolic Panel:  Recent Labs  40/98/1104/19/15 0846 02/04/14  NA 135* 136*  K 4.9 4.8  CL 100  --   CO2 23  --   GLUCOSE 98  --   BUN 39* 37*  CREATININE 1.16 1.0  CALCIUM 9.5  --    Liver Function Tests: No results for input(s): AST, ALT, ALKPHOS, BILITOT, PROT, ALBUMIN in the last 8760 hours. No results for input(s): LIPASE, AMYLASE in the last 8760 hours. No results for input(s): AMMONIA in the last 8760 hours. CBC:  Recent Labs  11/14/13 0846 01/17/14  WBC 6.0 4.9  NEUTROABS 4.2  --   HGB 10.4* 9.7*  HCT 32.6* 29*  MCV 93.1  --   PLT 230 207   TSH: No results for input(s): TSH in the last 8760 hours. A1C: No results found for: HGBA1C Lipid Panel:  Recent Labs  02/04/14  CHOL 125  HDL 39  LDLCALC 75  TRIG 53    CBC NO Diff (Complete Blood Count)    Result: 04/29/2014 6:03 PM   ( Status: F )       WBC 5.1     4.0-10.5 K/uL SLN   RBC 3.45   L 4.22-5.81 MIL/uL SLN   Hemoglobin 10.3   L 13.0-17.0 g/dL SLN   Hematocrit 91.430.1   L 39.0-52.0 % SLN   MCV 87.2     78.0-100.0 fL SLN   MCH 29.9     26.0-34.0 pg SLN   MCHC 34.2     30.0-36.0 g/dL SLN   RDW 78.216.0   H 95.6-21.311.5-15.5 % SLN   Platelet Count 208     150-400 K/uL SLN   BMP with Estimated GFR    Result: 06/05/2014 4:54 PM   ( Status: F )       Sodium 134   L 135-145 mEq/L SLN   Potassium 4.7     3.5-5.3 mEq/L SLN   Chloride 104     96-112 mEq/L SLN   CO2 22     19-32 mEq/L SLN   Glucose 81     70-99 mg/dL SLN   BUN 42   H 0-866-23 mg/dL SLN   Creatinine 5.781.26     0.50-1.35 mg/dL SLN   Calcium 8.7     4.6-96.28.4-10.5 mg/dL SLN   Est GFR, African American 56   L  mL/min SLN   Est GFR, NonAfrican American 49   L  mL/min SLN C Lipid Panel    Result: 06/05/2014 4:54 PM   ( Status: F )       Cholesterol 130     0-200 mg/dL SLN C Triglyceride 91     <150 mg/dL SLN   HDL Cholesterol 28   L >39 mg/dL SLN   Total Chol/HDL Ratio 4.6         Ratio SLN   VLDL  Cholesterol (Calc) 18     0-40 mg/dL SLN   LDL Cholesterol (Calc) 84     0-99 mg/dL Assessment/Plan 1. Essential hypertension, benign Controled on cozaar   2. Constipation - functional -without constipation at this time, cnt on current regimen   3. Iron deficiency anemia - conts iron, hgb stable  4. Pneumonia, organism unspecified Currently on Levaquin for pneumonia, was having increased cough and congestion early this week. Now is without complaints. Tolerating medication without side effects and has pulmonary status has improved

## 2014-07-05 ENCOUNTER — Non-Acute Institutional Stay (SKILLED_NURSING_FACILITY): Payer: Medicare Other | Admitting: Nurse Practitioner

## 2014-07-05 DIAGNOSIS — R339 Retention of urine, unspecified: Secondary | ICD-10-CM

## 2014-07-05 DIAGNOSIS — D509 Iron deficiency anemia, unspecified: Secondary | ICD-10-CM

## 2014-07-05 DIAGNOSIS — K5909 Other constipation: Secondary | ICD-10-CM

## 2014-07-05 DIAGNOSIS — K5904 Chronic idiopathic constipation: Secondary | ICD-10-CM

## 2014-07-05 DIAGNOSIS — I10 Essential (primary) hypertension: Secondary | ICD-10-CM

## 2014-07-05 NOTE — Progress Notes (Signed)
Patient ID: [name] Bennett L Louis, male   DOB: 02-04-1918, 80 y.o.   MRN: [id number]    Nursing [facility]  [facility]   Place of Service: SNF (31)  PCP: Margit HanksALEXANDER, ANNE D, MD  No Known Allergies  Chief Complaint  Patient presents with  . Medical Management of Chronic Issues    HPI:  Patient is a 80 y.o. male [facility] for routine follow up on chronic conditions. Pt with PMH of protein calorie-malnutrition, anemia, urinary retention, constipation, hypertension.  Pt was treated for pneumonia last month with resolved without any complications. Today pt feel after not locking wheelchair when he was trying to transfer. Reports he landed on his bottom. No pain noted. MAE without pain or difficulty.  Review of Systems:  Review of Systems  Constitutional: Negative for activity change, appetite change, fatigue and unexpected weight change.  HENT: Negative for congestion and hearing loss.   Eyes: Negative.   Respiratory: Negative for cough and shortness of breath.   Cardiovascular: Negative for chest pain, palpitations and leg swelling.  Gastrointestinal: Negative for abdominal pain, diarrhea and constipation.  Genitourinary: Negative for dysuria and difficulty urinating.  Musculoskeletal: Negative for myalgias and arthralgias.  Skin: Negative for color change and wound.  Neurological: Negative for dizziness and weakness.  Psychiatric/Behavioral: Negative for behavioral problems, confusion and agitation.    Past Medical History  Diagnosis Date  . Hypertension   . Urinary retention   . H/O total hip arthroplasty 05/01/2013   Past Surgical History  Procedure Laterality Date  . Fracture surgery    . Hip surgery    . Hip arthroplasty Left 05/01/2013    Procedure: ARTHROPLASTY BIPOLAR HIP;  Surgeon: Shelda PalMatthew D Olin, MD;  Location: [facility];  Service: Orthopedics;  Laterality: Left;   Social History:   reports that he has never smoked. He does  not have any smokeless tobacco history on file. He reports that he does not drink alcohol or use illicit drugs.  No family history on file.  Medications: Patient's Medications  New Prescriptions   No medications on file  Previous Medications   ACETAMINOPHEN (TYLENOL) 325 MG TABLET    Take 650 mg by mouth every 6 (six) hours as needed for mild pain or moderate pain.   FEEDING SUPPLEMENT (ENSURE COMPLETE) LIQD    Take 237 mLs by mouth 3 (three) times daily with meals.   FERROUS SULFATE 325 (65 FE) MG TABLET    Take 325 mg by mouth 3 (three) times daily with meals. For anemia   HYDROCODONE-ACETAMINOPHEN (NORCO/VICODIN) 5-325 MG PER TABLET    Take 1-2 tablets by mouth every 6 (six) hours as needed for moderate pain.   LOSARTAN (COZAAR) 50 MG TABLET    Take 50 mg by mouth daily. For HTN   MAGNESIUM CITRATE SOLN    Take 1 Bottle by mouth daily as needed for mild constipation.   POLYETHYLENE GLYCOL (MIRALAX / GLYCOLAX) PACKET    Take 17 g by mouth daily. For constipation  Modified Medications   No medications on file  Discontinued Medications   No medications on file     Physical Exam: Filed Vitals:   07/05/14 1739  BP: 111/59  Pulse: 60  Temp: 97.5 F (36.4 C)  Resp: 20  Weight: 149 lb (67.586 kg)    Physical Exam  Constitutional: He is oriented to person, place, and time. No distress.  Thin male in NAD  HENT:  Head: Normocephalic  and atraumatic.  Mouth/Throat: Oropharynx is clear and moist. No oropharyngeal exudate.  Eyes: Conjunctivae and EOM are normal. Pupils are equal, round, and reactive to light.  Neck: Normal range of motion. Neck supple.  Cardiovascular: Normal rate, regular rhythm and normal heart sounds.   Pulmonary/Chest: Effort normal and breath sounds normal.  Abdominal: Soft. Bowel sounds are normal.  Genitourinary:  With chronic foley catheter   Musculoskeletal: He exhibits no edema or tenderness.  Neurological: He is alert and oriented to person, place, and  time.  Skin: Skin is warm and dry. He is not diaphoretic.  Psychiatric: He has a normal mood and affect.    Labs reviewed: Basic Metabolic Panel:  Recent Labs  16/10/96 0846 02/04/14  NA 135* 136*  K 4.9 4.8  CL 100  --   CO2 23  --   GLUCOSE 98  --   BUN 39* 37*  CREATININE 1.16 1.0  CALCIUM 9.5  --    Liver Function Tests: No results for input(s): AST, ALT, ALKPHOS, BILITOT, PROT, ALBUMIN in the last 8760 hours. No results for input(s): LIPASE, AMYLASE in the last 8760 hours. No results for input(s): AMMONIA in the last 8760 hours. CBC:  Recent Labs  11/14/13 0846 01/17/14  WBC 6.0 4.9  NEUTROABS 4.2  --   HGB 10.4* 9.7*  HCT 32.6* 29*  MCV 93.1  --   PLT 230 207   TSH: No results for input(s): TSH in the last 8760 hours. A1C: No results found for: HGBA1C Lipid Panel:  Recent Labs  02/04/14  CHOL 125  HDL 39  LDLCALC 75  TRIG 53    CBC NO Diff (Complete Blood Count)    Result: 04/29/2014 6:03 PM   ( Status: F )       WBC 5.1     4.0-10.5 K/uL SLN   RBC 3.45   L 4.22-5.81 MIL/uL SLN   Hemoglobin 10.3   L 13.0-17.0 g/dL SLN   Hematocrit 04.5   L 39.0-52.0 % SLN   MCV 87.2     78.0-100.0 fL SLN   MCH 29.9     26.0-34.0 pg SLN   MCHC 34.2     30.0-36.0 g/dL SLN   RDW 40.9   H 81.1-91.4 % SLN   Platelet Count 208     150-400 K/uL SLN   BMP with Estimated GFR    Result: 06/05/2014 4:54 PM   ( Status: F )       Sodium 134   L 135-145 mEq/L SLN   Potassium 4.7     3.5-5.3 mEq/L SLN   Chloride 104     96-112 mEq/L SLN   CO2 22     19-32 mEq/L SLN   Glucose 81     70-99 mg/dL SLN   BUN 42   H 7-82 mg/dL SLN   Creatinine 9.56     0.50-1.35 mg/dL SLN   Calcium 8.7     2.1-30.8 mg/dL SLN   Est GFR, African American 56   L  mL/min SLN   Est GFR, NonAfrican American 49   L  mL/min SLN C Lipid Panel    Result: 06/05/2014 4:54 PM   ( Status: F )       Cholesterol 130     0-200 mg/dL SLN C Triglyceride 91     <150 mg/dL SLN   HDL Cholesterol 28    L >39 mg/dL SLN   Total Chol/HDL Ratio 4.6  Ratio SLN   VLDL Cholesterol (Calc) 18     0-40 mg/dL SLN   LDL Cholesterol (Calc) 84     0-99 mg/dL Assessment/Plan  1. Urinary retention conts with chronic foley without complications at this time  2. Constipation - functional -remains stable at this time  3. Essential hypertension, benign controlled  4. Iron deficiency anemia hgb stable  5. S/p fall Without pain or deficits, nursing to cont to monitor

## 2014-08-26 ENCOUNTER — Non-Acute Institutional Stay (SKILLED_NURSING_FACILITY): Payer: Medicare Other | Admitting: Internal Medicine

## 2014-08-26 DIAGNOSIS — D509 Iron deficiency anemia, unspecified: Secondary | ICD-10-CM

## 2014-08-26 DIAGNOSIS — K5904 Chronic idiopathic constipation: Secondary | ICD-10-CM

## 2014-08-26 DIAGNOSIS — I10 Essential (primary) hypertension: Secondary | ICD-10-CM

## 2014-08-26 DIAGNOSIS — K5909 Other constipation: Secondary | ICD-10-CM

## 2014-08-26 DIAGNOSIS — Z9889 Other specified postprocedural states: Secondary | ICD-10-CM

## 2014-08-26 DIAGNOSIS — Z960 Presence of urogenital implants: Secondary | ICD-10-CM

## 2014-08-26 DIAGNOSIS — Z978 Presence of other specified devices: Secondary | ICD-10-CM

## 2014-08-26 DIAGNOSIS — N182 Chronic kidney disease, stage 2 (mild): Secondary | ICD-10-CM

## 2014-08-26 NOTE — Progress Notes (Signed)
MRN: [id number] Name: [name] Bennett  Sex: male Age: 81 y.o. DOB: Mar 23, 1918  [facility] Level Of Care: SNF Provider: Merrilee SeashoreALEXANDER, Skyleigh Windle D Emergency Contacts: Extended Emergency Contact Information Primary Emergency Contact: Beaulah DinningGuy,[place]  [place] Home Phone: [phone] Relation: Niece Secondary Emergency Contact: Mariane MastersMcAdoo,Beverly  [place] Home Phone: [phone] Relation: Niece  Code Status: FULL  Allergies: Review of patient's allergies indicates no known allergies.  Chief Complaint  Patient presents with  . Medical Management of Chronic Issues    HPI: Patient is 81 y.o. male who is does very well being seen for routine issues.  Past Medical History  Diagnosis Date  . Hypertension   . Urinary retention   . H/O total hip arthroplasty 05/01/2013    Past Surgical History  Procedure Laterality Date  . Fracture surgery    . Hip surgery    . Hip arthroplasty Left 05/01/2013    Procedure: ARTHROPLASTY BIPOLAR HIP;  Surgeon: Shelda PalMatthew D Olin, MD;  Location: [facility];  Service: Orthopedics;  Laterality: Left;      Medication List       This list is accurate as of: 08/26/14 11:59 PM.  Always use your most recent med list.               acetaminophen 325 MG tablet  Commonly known as:  TYLENOL  Take 650 mg by mouth every 6 (six) hours as needed for mild pain or moderate pain.     feeding supplement (ENSURE COMPLETE) Liqd  Take 237 mLs by mouth 3 (three) times daily with meals.     ferrous sulfate 325 (65 FE) MG tablet  Take 325 mg by mouth 3 (three) times daily with meals. For anemia     HYDROcodone-acetaminophen 5-325 MG per tablet  Commonly known as:  NORCO/VICODIN  Take 1-2 tablets by mouth every 6 (six) hours as needed for moderate pain.     losartan 50 MG tablet  Commonly known as:  COZAAR  Take 100 mg by mouth daily. For HTN     magnesium citrate Soln  Take 1 Bottle by mouth daily as needed for mild  constipation.     polyethylene glycol packet  Commonly known as:  MIRALAX / GLYCOLAX  Take 17 g by mouth daily. For constipation        No orders of the defined types were placed in this encounter.    Immunization History  Administered Date(s) Administered  . PPD Test 07/17/2013    History  Substance Use Topics  . Smoking status: Never Smoker   . Smokeless tobacco: Not on file  . Alcohol Use: No    Review of Systems  DATA OBTAINED: from patient; no c/o GENERAL:  no fevers, fatigue, appetite changes SKIN: No itching, rash HEENT: No complaint RESPIRATORY: No cough, wheezing, SOB CARDIAC: No chest pain, palpitations, lower extremity edema  GI: No abdominal pain, No N/V/D or constipation, No heartburn or reflux  GU: No dysuria, frequency or urgency, or incontinence  MUSCULOSKELETAL: No unrelieved bone/joint pain NEUROLOGIC: No headache, dizziness  PSYCHIATRIC: No overt anxiety or sadness  Filed Vitals:   08/26/14 1926  BP: 137/76  Pulse: 78  Temp: 97.3 F (36.3 C)  Resp: 18    Physical Exam  GENERAL APPEARANCE: Alert, conversant, No acute distress; very pleasant BM SKIN: No diaphoresis rash, HEENT: Unremarkable RESPIRATORY: Breathing is even, unlabored. Lung sounds are clear   CARDIOVASCULAR: Heart RRR no murmurs, rubs or gallops. No peripheral  edema  GASTROINTESTINAL: Abdomen is soft, non-tender, not distended w/ normal bowel sounds.  GENITOURINARY: Bladder non tender, not distended ; foley in place MUSCULOSKELETAL: No abnormal joints or musculature NEUROLOGIC: Cranial nerves 2-12 grossly intact. Moves all extremities PSYCHIATRIC: Mood and affect appropriate to situation, no behavioral issues  Patient Active Problem List   Diagnosis Date Noted  . CKD (chronic kidney disease) stage 2, GFR 60-89 ml/min 08/28/2014  . Chronic indwelling Foley catheter 07/12/2013  . H/O total hip arthroplasty   . Protein-calorie malnutrition, severe 04/30/2013  . Weakness  generalized 04/28/2013  . Left leg pain 04/28/2013  . Hematuria 09/15/2011  . UTI (lower urinary tract infection) 09/15/2011  . Anemia, iron deficiency 09/15/2011  . Constipation - functional 09/15/2011  . Urinary retention 09/15/2011  . WEIGHT LOSS 07/27/2009  . HIP FRACTURE, RIGHT 01/22/2008  . Essential hypertension, benign 10/03/2007  . DEGENERATIVE JOINT DISEASE, KNEES, BILATERAL 10/03/2007  . HERNIORRHAPHY, HX OF 10/03/2007    CBC    Component Value Date/Time   WBC 4.9 01/17/2014   WBC 6.0 11/14/2013 0846   RBC 3.50* 11/14/2013 0846   RBC 3.28* 04/28/2013 2115   HGB 9.7* 01/17/2014   HCT 29* 01/17/2014   PLT 207 01/17/2014   MCV 93.1 11/14/2013 0846   LYMPHSABS 1.1 11/14/2013 0846   MONOABS 0.6 11/14/2013 0846   EOSABS 0.1 11/14/2013 0846   BASOSABS 0.0 11/14/2013 0846    CMP     Component Value Date/Time   NA 136* 02/04/2014   NA 135* 11/14/2013 0846   K 4.8 02/04/2014   CL 100 11/14/2013 0846   CO2 23 11/14/2013 0846   GLUCOSE 98 11/14/2013 0846   BUN 37* 02/04/2014   BUN 39* 11/14/2013 0846   CREATININE 1.0 02/04/2014   CREATININE 1.16 11/14/2013 0846   CALCIUM 9.5 11/14/2013 0846   PROT 5.8* 05/03/2013 0445   ALBUMIN 2.3* 05/03/2013 0445   AST 15 05/03/2013 0445   ALT 6 05/03/2013 0445   ALKPHOS 46 05/03/2013 0445   BILITOT 0.3 05/03/2013 0445   GFRNONAA 52* 11/14/2013 0846   GFRAA 60* 11/14/2013 0846    Assessment and Plan  Essential hypertension, benign Good control on increased dose of losartan   daily   CKD (chronic kidney disease) stage 2, GFR 60-89 ml/min GFR -64, CrCl -34 in 07/2014; chronic foley 2/2 bladder outlet obs   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

## 2014-08-28 ENCOUNTER — Encounter: Payer: Self-pay | Admitting: Internal Medicine

## 2014-08-28 DIAGNOSIS — N182 Chronic kidney disease, stage 2 (mild): Secondary | ICD-10-CM | POA: Insufficient documentation

## 2014-08-28 NOTE — Assessment & Plan Note (Signed)
GFR -64, CrCl -34 in 07/2014; chronic foley 2/2 bladder outlet obs

## 2014-08-28 NOTE — Assessment & Plan Note (Signed)
2/2 to BPH with obstruction ;followed by urology;severakl UTI's a year, on no prophylaxis now

## 2014-08-28 NOTE — Assessment & Plan Note (Signed)
Chronic and stable on daily miralax and prn mag citrate

## 2014-08-28 NOTE — Assessment & Plan Note (Signed)
With most recent Fe 07/2014 was 27; Hb 10, HCT 30.7; PLAN = continue iron supplent

## 2014-08-28 NOTE — Assessment & Plan Note (Signed)
Good control on increased dose of losartan  100mg  daily

## 2014-10-17 ENCOUNTER — Non-Acute Institutional Stay (SKILLED_NURSING_FACILITY): Payer: Medicare Other | Admitting: Internal Medicine

## 2014-10-17 DIAGNOSIS — I10 Essential (primary) hypertension: Secondary | ICD-10-CM

## 2014-10-17 DIAGNOSIS — N401 Enlarged prostate with lower urinary tract symptoms: Secondary | ICD-10-CM | POA: Diagnosis not present

## 2014-10-17 DIAGNOSIS — D509 Iron deficiency anemia, unspecified: Secondary | ICD-10-CM

## 2014-10-17 DIAGNOSIS — N138 Other obstructive and reflux uropathy: Secondary | ICD-10-CM | POA: Diagnosis not present

## 2014-10-17 NOTE — Progress Notes (Signed)
MRN: [id number] Name: [name] Bennett  Sex: male Age: 81 y.o. DOB: [birth year]  [name] Facility/Room: 222 Level Of Care: SNF Provider: Merrilee Seashore D Emergency Contacts: Extended Emergency Contact Information Primary Emergency Contact: Beaulah Dinning [place] Home Phone: [phone] Relation: Niece Secondary Emergency Contact: Ruberg,Beverly  [place] Home Phone: [phone] Relation: Niece  Code Status: FULL  Allergies: Review of patient's allergies indicates no known allergies.  Chief Complaint  Patient presents with  . Medical Management of Chronic Issues    HPI: Patient is 81 y.o. male who is being seen for routine issues.  Past Medical History  Diagnosis Date  . Hypertension   . Urinary retention   . H/O total hip arthroplasty 05/01/2013    Past Surgical History  Procedure Laterality Date  . Fracture surgery    . Hip surgery    . Hip arthroplasty Left 05/01/2013    Procedure: ARTHROPLASTY BIPOLAR HIP;  Surgeon: Shelda Pal, MD;  Location: [facility];  Service: Orthopedics;  Laterality: Left;      Medication List       This list is accurate as of: 10/17/14 11:59 PM.  Always use your most recent med list.               acetaminophen 325 MG tablet  Commonly known as:  TYLENOL  Take 650 mg by mouth every 6 (six) hours as needed for mild pain or moderate pain.     feeding supplement (ENSURE COMPLETE) Liqd  Take 237 mLs by mouth 3 (three) times daily with meals.     ferrous sulfate 325 (65 FE) MG tablet  Take 325 mg by mouth 3 (three) times daily with meals. For anemia     HYDROcodone-acetaminophen 5-325 MG per tablet  Commonly known as:  NORCO/VICODIN  Take 1-2 tablets by mouth every 6 (six) hours as needed for moderate pain.     losartan 50 MG tablet  Commonly known as:  COZAAR  Take 100 mg by mouth daily. For HTN     magnesium citrate Soln  Take 1 Bottle by mouth daily as needed for mild constipation.     polyethylene glycol packet  Commonly known as:  MIRALAX / GLYCOLAX  Take 17 g by mouth daily. For constipation        No orders of the defined types were placed in this encounter.    Immunization History  Administered Date(s) Administered  . PPD Test 07/17/2013    History  Substance Use Topics  . Smoking status: Never Smoker   . Smokeless tobacco: Not on file  . Alcohol Use: No    Review of Systems  DATA OBTAINED: from patient, nurse, medical record GENERAL:  no fevers, fatigue, appetite changes SKIN: No itching, rash HEENT: No complaint RESPIRATORY: No cough, wheezing, SOB CARDIAC: No chest pain, palpitations, lower extremity edema  GI: No abdominal pain, No N/V/D or constipation, No heartburn or reflux  GU: No dysuria, frequency or urgency; MUSCULOSKELETAL: No unrelieved bone/joint pain NEUROLOGIC: No headache, dizziness  PSYCHIATRIC: No overt anxiety or sadness  Filed Vitals:   10/17/14 2115  BP: 125/61  Pulse: 77  Temp: 97.1 F (36.2 C)  Resp: 18    Physical Exam  GENERAL APPEARANCE: Alert, conversant,BM No acute distress , always upbeat SKIN: No diaphoresis rash, or wounds HEENT: Unremarkable RESPIRATORY: Breathing is even, unlabored. Lung sounds are clear   CARDIOVASCULAR: Heart RRR no murmurs, rubs or gallops. No peripheral edema  GASTROINTESTINAL:  Abdomen is soft, non-tender, not distended w/ normal bowel sounds.  GENITOURINARY: Bladder non tender, not distended; indwelling foley  MUSCULOSKELETAL: No abnormal joints or musculature NEUROLOGIC: Cranial nerves 2-12 grossly intact. Moves all extremities PSYCHIATRIC: Mood and affect appropriate to situation, no behavioral issues  Patient Active Problem List   Diagnosis Date Noted  . CKD (chronic kidney disease) stage 2, GFR 60-89 ml/min 08/28/2014  . Chronic indwelling Foley catheter 07/12/2013  . H/O total hip arthroplasty   . Protein-calorie malnutrition, severe 04/30/2013  . Weakness  generalized 04/28/2013  . Left leg pain 04/28/2013  . Hematuria 09/15/2011  . UTI (lower urinary tract infection) 09/15/2011  . Anemia, iron deficiency 09/15/2011  . Constipation - functional 09/15/2011  . Prostatic hypertrophy, benign, with obstruction 09/15/2011  . WEIGHT LOSS 07/27/2009  . HIP FRACTURE, RIGHT 01/22/2008  . Essential hypertension, benign 10/03/2007  . DEGENERATIVE JOINT DISEASE, KNEES, BILATERAL 10/03/2007  . HERNIORRHAPHY, HX OF 10/03/2007    CBC    Component Value Date/Time   WBC 4.9 01/17/2014   WBC 6.0 11/14/2013 0846   RBC 3.50* 11/14/2013 0846   RBC 3.28* 04/28/2013 2115   HGB 9.7* 01/17/2014   HCT 29* 01/17/2014   PLT 207 01/17/2014   MCV 93.1 11/14/2013 0846   LYMPHSABS 1.1 11/14/2013 0846   MONOABS 0.6 11/14/2013 0846   EOSABS 0.1 11/14/2013 0846   BASOSABS 0.0 11/14/2013 0846    CMP     Component Value Date/Time   NA 136* 02/04/2014   NA 135* 11/14/2013 0846   K 4.8 02/04/2014   CL 100 11/14/2013 0846   CO2 23 11/14/2013 0846   GLUCOSE 98 11/14/2013 0846   BUN 37* 02/04/2014   BUN 39* 11/14/2013 0846   CREATININE 1.0 02/04/2014   CREATININE 1.16 11/14/2013 0846   CALCIUM 9.5 11/14/2013 0846   PROT 5.8* 05/03/2013 0445   ALBUMIN 2.3* 05/03/2013 0445   AST 15 05/03/2013 0445   ALT 6 05/03/2013 0445   ALKPHOS 46 05/03/2013 0445   BILITOT 0.3 05/03/2013 0445   GFRNONAA 52* 11/14/2013 0846   GFRAA 60* 11/14/2013 0846    Assessment and Plan  Essential hypertension, benign Controlled on losartan 100mg  daily;Plan continue losartan   Anemia, iron deficiency Pt on iron and vtamin C added in addition;Plan- continue both supplements   Prostatic hypertrophy, benign, with obstruction Pt continues with chronic indwelling foley without recent UTI     Margit HanksALEXANDER, ANNE D, MD

## 2014-11-06 ENCOUNTER — Encounter: Payer: Self-pay | Admitting: Internal Medicine

## 2014-11-06 NOTE — Assessment & Plan Note (Signed)
Pt continues with chronic indwelling foley without recent UTI

## 2014-11-06 NOTE — Assessment & Plan Note (Signed)
Pt on iron and vtamin C added in addition;Plan- continue both supplements

## 2014-11-06 NOTE — Assessment & Plan Note (Signed)
Controlled on losartan 100mg  daily;Plan continue losartan

## 2014-12-19 ENCOUNTER — Encounter: Payer: Self-pay | Admitting: Internal Medicine

## 2014-12-19 ENCOUNTER — Non-Acute Institutional Stay (SKILLED_NURSING_FACILITY): Payer: Medicare Other | Admitting: Internal Medicine

## 2014-12-19 DIAGNOSIS — N182 Chronic kidney disease, stage 2 (mild): Secondary | ICD-10-CM

## 2014-12-19 DIAGNOSIS — I10 Essential (primary) hypertension: Secondary | ICD-10-CM | POA: Diagnosis not present

## 2014-12-19 NOTE — Progress Notes (Signed)
MRN: [id number] Name: [name] Bennett  Sex: male Age: 81 y.o. DOB: 1918-03-03  [name] Facility/Room: Level Of Care: SNF Provider: Merrilee Seashore D Emergency Contacts: Extended Emergency Contact Information Primary Emergency Contact: Beaulah Dinning [place] Home Phone: [phone] Relation: Niece Secondary Emergency Contact: Vanderweide,Beverly  [place] Home Phone: [phone] Relation: Niece  Code Status:FULL   Allergies: Review of patient's allergies indicates no known allergies.  Chief Complaint  Patient presents with  . Medical Management of Chronic Issues    HPI: Patient is 81 y.o. male who is being seen for routine issues.   Past Medical History  Diagnosis Date  . Hypertension   . Urinary retention   . H/O total hip arthroplasty 05/01/2013    Past Surgical History  Procedure Laterality Date  . Fracture surgery    . Hip surgery    . Hip arthroplasty Left 05/01/2013    Procedure: ARTHROPLASTY BIPOLAR HIP;  Surgeon: Shelda Pal, MD;  Location: [facility];  Service: Orthopedics;  Laterality: Left;      Medication List       This list is accurate as of: 12/19/14  2:02 PM.  Always use your most recent med list.               acetaminophen 325 MG tablet  Commonly known as:  TYLENOL  Take 650 mg by mouth every 6 (six) hours as needed for mild pain or moderate pain.     feeding supplement (ENSURE COMPLETE) Liqd  Take 237 mLs by mouth 3 (three) times daily with meals.     ferrous sulfate 325 (65 FE) MG tablet  Take 325 mg by mouth 3 (three) times daily with meals. For anemia     HYDROcodone-acetaminophen 5-325 MG per tablet  Commonly known as:  NORCO/VICODIN  Take 1-2 tablets by mouth every 6 (six) hours as needed for moderate pain.     losartan 50 MG tablet  Commonly known as:  COZAAR  Take 100 mg by mouth daily. For HTN     magnesium citrate Soln  Take 1 Bottle by mouth daily as needed for mild constipation.     polyethylene glycol packet  Commonly known as:  MIRALAX / GLYCOLAX  Take 17 g by mouth daily. For constipation        No orders of the defined types were placed in this encounter.    Immunization History  Administered Date(s) Administered  . PPD Test 07/17/2013    History  Substance Use Topics  . Smoking status: Never Smoker   . Smokeless tobacco: Not on file  . Alcohol Use: No    Review of Systems  DATA OBTAINED: from patient, nurse, medical record GENERAL:  no fevers, fatigue, appetite changes SKIN: No itching, rash HEENT: No complaint RESPIRATORY: No cough, wheezing, SOB CARDIAC: No chest pain, palpitations, lower extremity edema  GI: No abdominal pain, No N/V/D or constipation, No heartburn or reflux  GU: No dysuria, frequency or urgency, or incontinence  MUSCULOSKELETAL: No unrelieved bone/joint pain NEUROLOGIC: No headache, dizziness  PSYCHIATRIC: No overt anxiety or sadness  Filed Vitals:   12/19/14 1401  BP: 152/71  Pulse: 78  Temp: 98.2 F (36.8 C)  Resp: 18    Physical Exam  GENERAL APPEARANCE: Alert, conversant,BM No acute distress  SKIN: No diaphoresis rash HEENT: Unremarkable RESPIRATORY: Breathing is even, unlabored. Lung sounds are clear   CARDIOVASCULAR: Heart RRR no murmurs, rubs or gallops. No peripheral edema  GASTROINTESTINAL:  Abdomen is soft, non-tender, not distended w/ normal bowel sounds.  GENITOURINARY: Bladder non tender, not distended; indwelling foley  MUSCULOSKELETAL: No abnormal joints or musculature NEUROLOGIC: Cranial nerves 2-12 grossly intact. Moves all extremities PSYCHIATRIC: Mood and affect appropriate to situation, no behavioral issues  Patient Active Problem List   Diagnosis Date Noted  . CKD (chronic kidney disease) stage 2, GFR 60-89 ml/min 08/28/2014  . Chronic indwelling Foley catheter 07/12/2013  . H/O total hip arthroplasty   . Protein-calorie malnutrition, severe 04/30/2013  . Weakness generalized 04/28/2013   . Left leg pain 04/28/2013  . Hematuria 09/15/2011  . UTI (lower urinary tract infection) 09/15/2011  . Anemia, iron deficiency 09/15/2011  . Constipation - functional 09/15/2011  . Prostatic hypertrophy, benign, with obstruction 09/15/2011  . WEIGHT LOSS 07/27/2009  . HIP FRACTURE, RIGHT 01/22/2008  . Essential hypertension, benign 10/03/2007  . DEGENERATIVE JOINT DISEASE, KNEES, BILATERAL 10/03/2007  . HERNIORRHAPHY, HX OF 10/03/2007    CBC    Component Value Date/Time   WBC 4.9 01/17/2014   WBC 6.0 11/14/2013 0846   RBC 3.50* 11/14/2013 0846   RBC 3.28* 04/28/2013 2115   HGB 9.7* 01/17/2014   HCT 29* 01/17/2014   PLT 207 01/17/2014   MCV 93.1 11/14/2013 0846   LYMPHSABS 1.1 11/14/2013 0846   MONOABS 0.6 11/14/2013 0846   EOSABS 0.1 11/14/2013 0846   BASOSABS 0.0 11/14/2013 0846    CMP     Component Value Date/Time   NA 136* 02/04/2014   NA 135* 11/14/2013 0846   K 4.8 02/04/2014   CL 100 11/14/2013 0846   CO2 23 11/14/2013 0846   GLUCOSE 98 11/14/2013 0846   BUN 37* 02/04/2014   BUN 39* 11/14/2013 0846   CREATININE 1.0 02/04/2014   CREATININE 1.16 11/14/2013 0846   CALCIUM 9.5 11/14/2013 0846   PROT 5.8* 05/03/2013 0445   ALBUMIN 2.3* 05/03/2013 0445   AST 15 05/03/2013 0445   ALT 6 05/03/2013 0445   ALKPHOS 46 05/03/2013 0445   BILITOT 0.3 05/03/2013 0445   GFRNONAA 52* 11/14/2013 0846   GFRAA 60* 11/14/2013 0846    Assessment and Plan  CKD (chronic kidney disease) stage 2, GFR 60-89 ml/min Chronic and stable; calculate CrCl -44;on losartan 50 ; Cont current meds and monitormg for HTN   Essential hypertension, benign Controlled on Losartan 100 mg daily with goal < 150/90 2/2 age     Margit HanksALEXANDER, Levy Wellman D, MD

## 2014-12-19 NOTE — Assessment & Plan Note (Signed)
Controlled on Losartan 100 mg daily with goal < 150/90 2/2 age

## 2014-12-19 NOTE — Progress Notes (Signed)
MRN: [id number] Name: [name] Bennett  Sex: male Age: 81 y.o. DOB: 11-21-1917  [name] Facility/Room: Level Of Care: SNF Provider: Merrilee Seashore D Emergency Contacts: Extended Emergency Contact Information Primary Emergency Contact: Beaulah Dinning [place] Home Phone: [phone] Relation: Niece Secondary Emergency Contact: Puig,Beverly  [place] Home Phone: [phone] Relation: Niece  Code Status:FULL   Allergies: Review of patient's allergies indicates no known allergies.  Chief Complaint  Patient presents with  . Medical Management of Chronic Issues    HPI: Patient is 81 y.o. male who is being seen for routine issues.  Past Medical History  Diagnosis Date  . Hypertension   . Urinary retention   . H/O total hip arthroplasty 05/01/2013    Past Surgical History  Procedure Laterality Date  . Fracture surgery    . Hip surgery    . Hip arthroplasty Left 05/01/2013    Procedure: ARTHROPLASTY BIPOLAR HIP;  Surgeon: Shelda Pal, MD;  Location: [facility];  Service: Orthopedics;  Laterality: Left;      Medication List       This list is accurate as of: 12/19/14 11:32 AM.  Always use your most recent med list.               acetaminophen 325 MG tablet  Commonly known as:  TYLENOL  Take 650 mg by mouth every 6 (six) hours as needed for mild pain or moderate pain.     feeding supplement (ENSURE COMPLETE) Liqd  Take 237 mLs by mouth 3 (three) times daily with meals.     ferrous sulfate 325 (65 FE) MG tablet  Take 325 mg by mouth 3 (three) times daily with meals. For anemia     HYDROcodone-acetaminophen 5-325 MG per tablet  Commonly known as:  NORCO/VICODIN  Take 1-2 tablets by mouth every 6 (six) hours as needed for moderate pain.     losartan 50 MG tablet  Commonly known as:  COZAAR  Take 100 mg by mouth daily. For HTN     magnesium citrate Soln  Take 1 Bottle by mouth daily as needed for mild constipation.     polyethylene glycol packet  Commonly known as:  MIRALAX / GLYCOLAX  Take 17 g by mouth daily. For constipation        No orders of the defined types were placed in this encounter.    Immunization History  Administered Date(s) Administered  . PPD Test 07/17/2013    History  Substance Use Topics  . Smoking status: Never Smoker   . Smokeless tobacco: Not on file  . Alcohol Use: No    Review of Systems  DATA OBTAINED: from patient, nurses GENERAL:  no fevers, fatigue, appetite changes SKIN: No itching, rash HEENT: No complaint RESPIRATORY: No cough, wheezing, SOB CARDIAC: No chest pain, palpitations, lower extremity edema  GI: No abdominal pain, No N/V/D or constipation, No heartburn or reflux  GU: No dysuria, frequency or urgency, or incontinence  MUSCULOSKELETAL: No unrelieved bone/joint pain NEUROLOGIC: No headache, dizziness  PSYCHIATRIC: No overt anxiety or sadness  There were no vitals filed for this visit.  Physical Exam  GENERAL APPEARANCE: Alert, conversant,BM No acute distress  SKIN: No diaphoresis rash HEENT: Unremarkable RESPIRATORY: Breathing is even, unlabored. Lung sounds are clear   CARDIOVASCULAR: Heart RRR no murmurs, rubs or gallops. No peripheral edema  GASTROINTESTINAL: Abdomen is soft, non-tender, not distended w/ normal bowel sounds.  GENITOURINARY: Bladder non tender, not distended  MUSCULOSKELETAL:  No abnormal joints or musculature NEUROLOGIC: Cranial nerves 2-12 grossly intact. Moves all extremities PSYCHIATRIC: Mood and affect appropriate to situation, no behavioral issues  Patient Active Problem List   Diagnosis Date Noted  . CKD (chronic kidney disease) stage 2, GFR 60-89 ml/min 08/28/2014  . Chronic indwelling Foley catheter 07/12/2013  . H/O total hip arthroplasty   . Protein-calorie malnutrition, severe 04/30/2013  . Weakness generalized 04/28/2013  . Left leg pain 04/28/2013  . Hematuria 09/15/2011  . UTI (lower urinary tract  infection) 09/15/2011  . Anemia, iron deficiency 09/15/2011  . Constipation - functional 09/15/2011  . Prostatic hypertrophy, benign, with obstruction 09/15/2011  . WEIGHT LOSS 07/27/2009  . HIP FRACTURE, RIGHT 01/22/2008  . Essential hypertension, benign 10/03/2007  . DEGENERATIVE JOINT DISEASE, KNEES, BILATERAL 10/03/2007  . HERNIORRHAPHY, HX OF 10/03/2007    CBC    Component Value Date/Time   WBC 4.9 01/17/2014   WBC 6.0 11/14/2013 0846   RBC 3.50* 11/14/2013 0846   RBC 3.28* 04/28/2013 2115   HGB 9.7* 01/17/2014   HCT 29* 01/17/2014   PLT 207 01/17/2014   MCV 93.1 11/14/2013 0846   LYMPHSABS 1.1 11/14/2013 0846   MONOABS 0.6 11/14/2013 0846   EOSABS 0.1 11/14/2013 0846   BASOSABS 0.0 11/14/2013 0846    CMP     Component Value Date/Time   NA 136* 02/04/2014   NA 135* 11/14/2013 0846   K 4.8 02/04/2014   CL 100 11/14/2013 0846   CO2 23 11/14/2013 0846   GLUCOSE 98 11/14/2013 0846   BUN 37* 02/04/2014   BUN 39* 11/14/2013 0846   CREATININE 1.0 02/04/2014   CREATININE 1.16 11/14/2013 0846   CALCIUM 9.5 11/14/2013 0846   PROT 5.8* 05/03/2013 0445   ALBUMIN 2.3* 05/03/2013 0445   AST 15 05/03/2013 0445   ALT 6 05/03/2013 0445   ALKPHOS 46 05/03/2013 0445   BILITOT 0.3 05/03/2013 0445   GFRNONAA 52* 11/14/2013 0846   GFRAA 60* 11/14/2013 0846    Assessment and Plan  CKD (chronic kidney disease) stage 2, GFR 60-89 ml/min Chronic and stable; calculate CrCl -44;on losartan 50 ; Cont current meds and monitormg for HTN   Essential hypertension, benign Controlled on Losartan 100 mg daily with goal < 150/90 2/2 age     Margit HanksALEXANDER, Martisha Toulouse D, MD

## 2014-12-19 NOTE — Assessment & Plan Note (Signed)
Chronic and stable; calculate CrCl -44;on losartan 50 ; Cont current meds and monitormg for HTN

## 2015-02-22 ENCOUNTER — Encounter: Payer: Self-pay | Admitting: Internal Medicine

## 2015-02-22 NOTE — Progress Notes (Signed)
MRN: [id number] Name: [name] Bennett  Sex: male Age: 81 y.o. DOB: [birth year]  PSC #:  Facility/Room: Level Of Care: SNF Provider: Merrilee Seashore D Emergency Contacts: Extended Emergency Contact Information Primary Emergency Contact: Beaulah Dinning [place] Home Phone: [phone] Relation: Niece Secondary Emergency Contact: Hawe,Beverly  [place] Home Phone: [phone] Relation: Niece  Code Status:   Allergies: Review of patient's allergies indicates no known allergies.  No chief complaint on file.   HPI: Patient is 81 y.o. male who   Past Medical History  Diagnosis Date  . Hypertension   . Urinary retention   . H/O total hip arthroplasty 05/01/2013    Past Surgical History  Procedure Laterality Date  . Fracture surgery    . Hip surgery    . Hip arthroplasty Left 05/01/2013    Procedure: ARTHROPLASTY BIPOLAR HIP;  Surgeon: Shelda Pal, MD;  Location: [facility];  Service: Orthopedics;  Laterality: Left;      Medication List       This list is accurate as of: 02/22/15  9:17 PM.  Always use your most recent med list.               acetaminophen 325 MG tablet  Commonly known as:  TYLENOL  Take 650 mg by mouth every 6 (six) hours as needed for mild pain or moderate pain.     feeding supplement (ENSURE COMPLETE) Liqd  Take 237 mLs by mouth 3 (three) times daily with meals.     ferrous sulfate 325 (65 FE) MG tablet  Take 325 mg by mouth 3 (three) times daily with meals. For anemia     HYDROcodone-acetaminophen 5-325 MG per tablet  Commonly known as:  NORCO/VICODIN  Take 1-2 tablets by mouth every 6 (six) hours as needed for moderate pain.     losartan 50 MG tablet  Commonly known as:  COZAAR  Take 100 mg by mouth daily. For HTN     magnesium citrate Soln  Take 1 Bottle by mouth daily as needed for mild constipation.     polyethylene glycol packet  Commonly known as:  MIRALAX / GLYCOLAX  Take 17 g by mouth daily. For  constipation        No orders of the defined types were placed in this encounter.    Immunization History  Administered Date(s) Administered  . PPD Test 07/17/2013    History  Substance Use Topics  . Smoking status: Never Smoker   . Smokeless tobacco: Not on file  . Alcohol Use: No    Review of Systems  DATA OBTAINED: from patient, nurse, medical record, family member GENERAL:  no fevers, fatigue, appetite changes SKIN: No itching, rash HEENT: No complaint RESPIRATORY: No cough, wheezing, SOB CARDIAC: No chest pain, palpitations, lower extremity edema  GI: No abdominal pain, No N/V/D or constipation, No heartburn or reflux  GU: No dysuria, frequency or urgency, or incontinence  MUSCULOSKELETAL: No unrelieved bone/joint pain NEUROLOGIC: No headache, dizziness  PSYCHIATRIC: No overt anxiety or sadness  There were no vitals filed for this visit.  Physical Exam  GENERAL APPEARANCE: Alert, conversant, No acute distress  SKIN: No diaphoresis rash, or wounds HEENT: Unremarkable RESPIRATORY: Breathing is even, unlabored. Lung sounds are clear   CARDIOVASCULAR: Heart RRR no murmurs, rubs or gallops. No peripheral edema  GASTROINTESTINAL: Abdomen is soft, non-tender, not distended w/ normal bowel sounds.  GENITOURINARY: Bladder non tender, not distended  MUSCULOSKELETAL: No abnormal joints or musculature NEUROLOGIC:  Cranial nerves 2-12 grossly intact. Moves all extremities PSYCHIATRIC: Mood and affect appropriate to situation, no behavioral issues  Patient Active Problem List   Diagnosis Date Noted  . CKD (chronic kidney disease) stage 2, GFR 60-89 ml/min 08/28/2014  . Chronic indwelling Foley catheter 07/12/2013  . H/O total hip arthroplasty   . Protein-calorie malnutrition, severe 04/30/2013  . Weakness generalized 04/28/2013  . Left leg pain 04/28/2013  . Hematuria 09/15/2011  . UTI (lower urinary tract infection) 09/15/2011  . Anemia, iron deficiency 09/15/2011   . Constipation - functional 09/15/2011  . Prostatic hypertrophy, benign, with obstruction 09/15/2011  . WEIGHT LOSS 07/27/2009  . HIP FRACTURE, RIGHT 01/22/2008  . Essential hypertension, benign 10/03/2007  . DEGENERATIVE JOINT DISEASE, KNEES, BILATERAL 10/03/2007  . HERNIORRHAPHY, HX OF 10/03/2007    CBC    Component Value Date/Time   WBC 4.9 01/17/2014   WBC 6.0 11/14/2013 0846   RBC 3.50* 11/14/2013 0846   RBC 3.28* 04/28/2013 2115   HGB 9.7* 01/17/2014   HCT 29* 01/17/2014   PLT 207 01/17/2014   MCV 93.1 11/14/2013 0846   LYMPHSABS 1.1 11/14/2013 0846   MONOABS 0.6 11/14/2013 0846   EOSABS 0.1 11/14/2013 0846   BASOSABS 0.0 11/14/2013 0846    CMP     Component Value Date/Time   NA 136* 02/04/2014   NA 135* 11/14/2013 0846   K 4.8 02/04/2014   CL 100 11/14/2013 0846   CO2 23 11/14/2013 0846   GLUCOSE 98 11/14/2013 0846   BUN 37* 02/04/2014   BUN 39* 11/14/2013 0846   CREATININE 1.0 02/04/2014   CREATININE 1.16 11/14/2013 0846   CALCIUM 9.5 11/14/2013 0846   PROT 5.8* 05/03/2013 0445   ALBUMIN 2.3* 05/03/2013 0445   AST 15 05/03/2013 0445   ALT 6 05/03/2013 0445   ALKPHOS 46 05/03/2013 0445   BILITOT 0.3 05/03/2013 0445   GFRNONAA 52* 11/14/2013 0846   GFRAA 60* 11/14/2013 0846    Assessment and Plan  No problem-specific assessment & plan notes found for this encounter.   Margit Hanks, MD     This encounter was created in error - please disregard.

## 2015-04-13 ENCOUNTER — Non-Acute Institutional Stay (SKILLED_NURSING_FACILITY): Payer: Medicare Other | Admitting: Internal Medicine

## 2015-04-13 ENCOUNTER — Encounter: Payer: Self-pay | Admitting: Internal Medicine

## 2015-04-13 DIAGNOSIS — N401 Enlarged prostate with lower urinary tract symptoms: Secondary | ICD-10-CM | POA: Diagnosis not present

## 2015-04-13 DIAGNOSIS — N138 Other obstructive and reflux uropathy: Secondary | ICD-10-CM | POA: Diagnosis not present

## 2015-04-13 DIAGNOSIS — IMO0002 Reserved for concepts with insufficient information to code with codable children: Secondary | ICD-10-CM

## 2015-04-13 DIAGNOSIS — N23 Unspecified renal colic: Secondary | ICD-10-CM | POA: Diagnosis not present

## 2015-04-13 NOTE — Progress Notes (Signed)
MRN: [id number] Name: [name] Bennett  Sex: male Age: 81 y.o. DOB: [birth year]  PSC #: [name] Facility/Room:222 Level Of Care: SNF Provider: Merrilee Seashore D Emergency Contacts: Extended Emergency Contact Information Primary Emergency Contact: Beaulah Dinning [place] Home Phone: [phone] Relation: Niece Secondary Emergency Contact: Mariane Masters [place] Home Phone: [phone] Relation: Niece  Code Status:   Allergies: Review of patient's allergies indicates no known allergies.  Chief Complaint  Patient presents with  . Acute Visit    HPI: Patient is 81 y.o. male with hx HTN and urinary retention, wearing chronic foley who nursing says is c/o penis pain for several days, no fever,no foul urine, gets his foley changed monthly, no acute MS change although pt has had some decline noted in past months. Nursing thinks he may be pulling on his foley as his hands have been in that area more lately.  Past Medical History  Diagnosis Date  . Hypertension   . Urinary retention   . H/O total hip arthroplasty 05/01/2013    Past Surgical History  Procedure Laterality Date  . Fracture surgery    . Hip surgery    . Hip arthroplasty Left 05/01/2013    Procedure: ARTHROPLASTY BIPOLAR HIP;  Surgeon: Shelda Pal, MD;  Location: [facility];  Service: Orthopedics;  Laterality: Left;      Medication List       This list is accurate as of: 04/13/15 11:59 PM.  Always use your most recent med list.               acetaminophen 325 MG tablet  Commonly known as:  TYLENOL  Take 650 mg by mouth every 6 (six) hours as needed for mild pain or moderate pain.     feeding supplement (ENSURE COMPLETE) Liqd  Take 237 mLs by mouth 3 (three) times daily with meals.     ferrous sulfate 325 (65 FE) MG tablet  Take 325 mg by mouth 3 (three) times daily with meals. For anemia     HYDROcodone-acetaminophen 5-325 MG per tablet  Commonly known as:  NORCO/VICODIN   Take 1-2 tablets by mouth every 6 (six) hours as needed for moderate pain.     losartan 50 MG tablet  Commonly known as:  COZAAR  Take 100 mg by mouth daily. For HTN     magnesium citrate Soln  Take 1 Bottle by mouth daily as needed for mild constipation.     polyethylene glycol packet  Commonly known as:  MIRALAX / GLYCOLAX  Take 17 g by mouth daily. For constipation        No orders of the defined types were placed in this encounter.    Immunization History  Administered Date(s) Administered  . PPD Test 07/17/2013    Social History  Substance Use Topics  . Smoking status: Never Smoker   . Smokeless tobacco: Not on file  . Alcohol Use: No    Review of Systems  DATA OBTAINED: from patient, nurse-as per HPI GENERAL:  no fevers, fatigue, appetite changes SKIN: No itching, rash HEENT: No complaint RESPIRATORY: No cough, wheezing, SOB CARDIAC: No chest pain, palpitations, lower extremity edema  GI: No abdominal pain, No N/V/D or constipation, No heartburn or reflux  GU: wears foley-"pain down there", MUSCULOSKELETAL: No unrelieved bone/joint pain NEUROLOGIC: No headache, dizziness  PSYCHIATRIC: No overt anxiety or sadness  Filed Vitals:   04/13/15 2050  BP: 113/70  Pulse: 73  Temp: 98.2 F (36.8 C)  Resp: 20    Physical Exam  GENERAL APPEARANCE: Alert, conversant, BM No acute distress  SKIN: No diaphoresis rash, or wounds HEENT: Unremarkable RESPIRATORY: Breathing is even, unlabored. Lung sounds are clear   CARDIOVASCULAR: Heart RRR no murmurs, rubs or gallops. No peripheral edema  GASTROINTESTINAL: Abdomen is soft, non-tender, not distended w/ normal bowel sounds.  GENITOURINARY: Bladder non tender, not distended  MUSCULOSKELETAL: No abnormal joints or musculature NEUROLOGIC: Cranial nerves 2-12 grossly intact. Moves all extremities PSYCHIATRIC: Mood and affect appropriate to situation, with some dementia, no behavioral issues  Patient Active Problem  List   Diagnosis Date Noted  . CKD (chronic kidney disease) stage 2, GFR 60-89 ml/min 08/28/2014  . Chronic indwelling Foley catheter 07/12/2013  . H/O total hip arthroplasty   . Protein-calorie malnutrition, severe 04/30/2013  . Weakness generalized 04/28/2013  . Left leg pain 04/28/2013  . Hematuria 09/15/2011  . UTI (lower urinary tract infection) 09/15/2011  . Anemia, iron deficiency 09/15/2011  . Constipation - functional 09/15/2011  . Prostatic hypertrophy, benign, with obstruction 09/15/2011  . WEIGHT LOSS 07/27/2009  . HIP FRACTURE, RIGHT 01/22/2008  . Essential hypertension, benign 10/03/2007  . DEGENERATIVE JOINT DISEASE, KNEES, BILATERAL 10/03/2007  . HERNIORRHAPHY, HX OF 10/03/2007    CBC    Component Value Date/Time   WBC 4.9 01/17/2014   WBC 6.0 11/14/2013 0846   RBC 3.50* 11/14/2013 0846   RBC 3.28* 04/28/2013 2115   HGB 9.7* 01/17/2014   HCT 29* 01/17/2014   PLT 207 01/17/2014   MCV 93.1 11/14/2013 0846   LYMPHSABS 1.1 11/14/2013 0846   MONOABS 0.6 11/14/2013 0846   EOSABS 0.1 11/14/2013 0846   BASOSABS 0.0 11/14/2013 0846    CMP     Component Value Date/Time   NA 136* 02/04/2014   NA 135* 11/14/2013 0846   K 4.8 02/04/2014   CL 100 11/14/2013 0846   CO2 23 11/14/2013 0846   GLUCOSE 98 11/14/2013 0846   BUN 37* 02/04/2014   BUN 39* 11/14/2013 0846   CREATININE 1.0 02/04/2014   CREATININE 1.16 11/14/2013 0846   CALCIUM 9.5 11/14/2013 0846   PROT 5.8* 05/03/2013 0445   ALBUMIN 2.3* 05/03/2013 0445   AST 15 05/03/2013 0445   ALT 6 05/03/2013 0445   ALKPHOS 46 05/03/2013 0445   BILITOT 0.3 05/03/2013 0445   GFRNONAA 52* 11/14/2013 0846   GFRAA 60* 11/14/2013 0846    Assessment/ Plan: Prostatic hypertrophy, benign, with obstruction  Penile pain reported started several days, intermiddent, no particular time of day; not usual presentation for UTI;will check urine and start urispas 100 mg TID and monitor sx     Margit Hanks,  MD

## 2015-04-16 ENCOUNTER — Encounter: Payer: Self-pay | Admitting: Internal Medicine

## 2015-04-16 NOTE — Assessment & Plan Note (Signed)
Penile pain reported started several days, intermiddent, no particular time of day; not usual presentation for UTI;will check urine and start urispas 100 mg TID and monitor sx

## 2015-06-26 ENCOUNTER — Encounter: Payer: Self-pay | Admitting: Internal Medicine

## 2015-06-26 ENCOUNTER — Non-Acute Institutional Stay (SKILLED_NURSING_FACILITY): Payer: Medicare Other | Admitting: Internal Medicine

## 2015-06-26 DIAGNOSIS — N138 Other obstructive and reflux uropathy: Secondary | ICD-10-CM | POA: Diagnosis not present

## 2015-06-26 DIAGNOSIS — N401 Enlarged prostate with lower urinary tract symptoms: Secondary | ICD-10-CM | POA: Diagnosis not present

## 2015-06-26 DIAGNOSIS — B351 Tinea unguium: Secondary | ICD-10-CM | POA: Diagnosis not present

## 2015-06-26 DIAGNOSIS — N182 Chronic kidney disease, stage 2 (mild): Secondary | ICD-10-CM

## 2015-06-26 DIAGNOSIS — I10 Essential (primary) hypertension: Secondary | ICD-10-CM

## 2015-06-26 DIAGNOSIS — Z9289 Personal history of other medical treatment: Secondary | ICD-10-CM | POA: Diagnosis not present

## 2015-06-26 DIAGNOSIS — E43 Unspecified severe protein-calorie malnutrition: Secondary | ICD-10-CM | POA: Diagnosis not present

## 2015-06-26 DIAGNOSIS — D509 Iron deficiency anemia, unspecified: Secondary | ICD-10-CM | POA: Diagnosis not present

## 2015-06-26 DIAGNOSIS — Z978 Presence of other specified devices: Secondary | ICD-10-CM

## 2015-06-26 DIAGNOSIS — K5900 Constipation, unspecified: Secondary | ICD-10-CM | POA: Diagnosis not present

## 2015-06-26 DIAGNOSIS — Z960 Presence of urogenital implants: Secondary | ICD-10-CM

## 2015-06-26 NOTE — Progress Notes (Signed)
Patient ID: [name] Bennett, male   DOB: January 02, 1918, 81 y.o.   MRN: [id number]    DATE: 06/26/15  Location:  [facility]    Place of Service: SNF (31)   Extended Emergency Contact Information Primary Emergency Contact: Beaulah Dinning [place] Home Phone: [phone] Relation: Niece Secondary Emergency Contact: Mariane Masters [place] Home Phone: [phone] Relation: Niece  Advanced Directive information  FULL CODE  Chief Complaint  Patient presents with  . Medical Management of Chronic Issues    HPI:  81 yo male long term resident seen today for f/u. He has no c/o. He was seen by podiatry last month to f/u on toenail fungus and pain. He has intermittent discomfort. He attempts to walk daily.  HTN - BP stable on losartan, norvasc.  Constipation - stable on miralax  Anemia - iron deficiency; stable on iron supplement  BPH with obstruction/urinary retention/chronic indwelling foley cath - followed by urology. He takes uristat  He takes vitamins, minerals and nutritional supplement  Past Medical History  Diagnosis Date  . Hypertension   . Urinary retention   . H/O total hip arthroplasty 05/01/2013    Past Surgical History  Procedure Laterality Date  . Fracture surgery    . Hip surgery    . Hip arthroplasty Left 05/01/2013    Procedure: ARTHROPLASTY BIPOLAR HIP;  Surgeon: Shelda Pal, MD;  Location: [facility];  Service: Orthopedics;  Laterality: Left;    Patient Care Team: Margit Hanks, MD as PCP - General (Internal Medicine)  Social History   Social History  . Marital Status: Widowed    Spouse Name: N/A  . Number of Children: N/A  . Years of Education: N/A   Occupational History  . Not on file.   Social History Main Topics  . Smoking status: Never Smoker   . Smokeless tobacco: Not on file  . Alcohol Use: No  . Drug Use: No  . Sexual Activity: Not on file   Other Topics Concern  . Not on file    Social History Narrative     reports that he has never smoked. He does not have any smokeless tobacco history on file. He reports that he does not drink alcohol or use illicit drugs.  Immunization History  Administered Date(s) Administered  . PPD Test 07/17/2013    No Known Allergies  Medications: Patient's Medications  New Prescriptions   No medications on file  Previous Medications   ACETAMINOPHEN (TYLENOL) 325 MG TABLET    Take 650 mg by mouth every 6 (six) hours as needed for mild pain or moderate pain.   FEEDING SUPPLEMENT (ENSURE COMPLETE) LIQD    Take 237 mLs by mouth 3 (three) times daily with meals.   FERROUS SULFATE 325 (65 FE) MG TABLET    Take 325 mg by mouth 3 (three) times daily with meals. For anemia   HYDROCODONE-ACETAMINOPHEN (NORCO/VICODIN) 5-325 MG PER TABLET    Take 1-2 tablets by mouth every 6 (six) hours as needed for moderate pain.   LOSARTAN (COZAAR) 50 MG TABLET    Take 100 mg by mouth daily. For HTN   MAGNESIUM CITRATE SOLN    Take 1 Bottle by mouth daily as needed for mild constipation.   POLYETHYLENE GLYCOL (MIRALAX / GLYCOLAX) PACKET    Take 17 g by mouth daily. For constipation  Modified Medications   No medications on file  Discontinued Medications   No medications on file  Review of Systems  Constitutional: Negative for chills, activity change and fatigue.  HENT: Negative for sore throat and trouble swallowing.   Eyes: Negative for visual disturbance.  Respiratory: Negative for cough, chest tightness and shortness of breath.   Cardiovascular: Negative for chest pain, palpitations and leg swelling.  Gastrointestinal: Negative for nausea, vomiting, abdominal pain and blood in stool.  Genitourinary: Positive for difficulty urinating. Negative for urgency and frequency.  Musculoskeletal: Positive for arthralgias. Negative for gait problem.  Skin: Negative for rash.  Neurological: Negative for weakness and headaches.  Psychiatric/Behavioral:  Negative for confusion and sleep disturbance. The patient is not nervous/anxious.     Filed Vitals:   06/26/15 2235  BP: 121/65  Pulse: 76  Temp: 98.2 F (36.8 C)  Weight: 145 lb 6.4 oz (65.953 kg)   Body mass index is 17.71 kg/(m^2).  Physical Exam  Constitutional: He is oriented to person, place, and time. He appears well-developed.  Frail appearing in NAD. Sitting on bed  HENT:  Mouth/Throat: Oropharynx is clear and moist.  Eyes: Pupils are equal, round, and reactive to light. No scleral icterus.  Neck: Neck supple. Carotid bruit is not present. No thyromegaly present.  Cardiovascular: Normal rate, regular rhythm, normal heart sounds and intact distal pulses.  Exam reveals no gallop and no friction rub.   No murmur heard. no distal LE swelling. No calf TTP  Pulmonary/Chest: Effort normal and breath sounds normal. He has no wheezes. He has no rales. He exhibits no tenderness.  Abdominal: Soft. Bowel sounds are normal. He exhibits no distension, no abdominal bruit, no pulsatile midline mass and no mass. There is no tenderness. There is no rebound and no guarding.  Genitourinary:  Foley DTG orange colored urine  Musculoskeletal: He exhibits edema and tenderness.  Lymphadenopathy:    He has no cervical adenopathy.  Neurological: He is alert and oriented to person, place, and time.  Skin: Skin is warm and dry. No rash noted.  Psychiatric: He has a normal mood and affect. His behavior is normal. Thought content normal.     Labs reviewed: No visits with results within 3 Month(s) from this visit. Latest known visit with results is:  Nursing Home on 02/18/2014  Component Date Value Ref Range Status  . Hemoglobin 01/17/2014 9.7* 13.5 - 17.5 g/dL Final  . HCT 96/29/528406/22/2015 29* 41 - 53 % Final  . Platelets 01/17/2014 207  150 - 399 K/L Final  . WBC 01/17/2014 4.9   Final  . Glucose 02/04/2014 81   Final  . BUN 02/04/2014 37* 4 - 21 mg/dL Final  . Creatinine 13/24/401007/04/2014 1.0  .6 -  1.3 mg/dL Final  . Potassium 27/25/366407/04/2014 4.8  3.4 - 5.3 mmol/L Final  . Sodium 02/04/2014 136* 137 - 147 mmol/L Final  . LDl/HDL Ratio 02/04/2014 3.2   Final  . Triglycerides 02/04/2014 53  40 - 160 mg/dL Final  . Cholesterol 40/34/742507/04/2014 125  0 - 200 mg/dL Final  . HDL 95/63/875607/04/2014 39  35 - 70 mg/dL Final  . LDL Cholesterol 02/04/2014 75   Final    No results found.   Assessment/Plan   ICD-9-CM ICD-10-CM   1. Prostatic hypertrophy, benign, with obstruction - stable with foley 600.21 N40.1    599.69 N13.8   2. Chronic indwelling Foley catheter - due to #1 V45.89 Z92.89   3. Essential hypertension, benign - stable 401.1 I10   4. Iron deficiency anemia - stable 280.9 D50.9   5. CKD (chronic kidney disease)  stage 2, GFR 60-89 ml/min - stable 585.2 N18.2   6. Protein-calorie malnutrition, severe (HCC) - stable 262 E43   7. Onychomycosis of toenail - stable 110.1 B35.1   8. Constipation, unspecified constipation type - stable 564.00 K59.00    Cont podiatry f/u   Pt is medically stable on current tx plan. Continue current medications as ordered. PT/OT/ST as indicated. Will follow  Iktan Aikman S. Ancil Linsey  [facility] [address] [place], [place] [address] [phone] Cell (Monday-Friday 8 AM - 5 PM) [phone] After 5 PM and follow prompts

## 2015-08-07 ENCOUNTER — Other Ambulatory Visit: Payer: Self-pay | Admitting: *Deleted

## 2015-08-07 MED ORDER — ALPRAZOLAM 0.25 MG PO TABS: ORAL_TABLET | ORAL | Status: DC

## 2015-08-07 NOTE — Telephone Encounter (Signed)
[facility] 

## 2015-08-14 ENCOUNTER — Emergency Department (HOSPITAL_COMMUNITY)
Admission: EM | Admit: 2015-08-14 | Discharge: 2015-08-14 | Disposition: A | Discharge status: Home / Self Care | Payer: Medicare Other | Attending: Emergency Medicine | Admitting: Emergency Medicine

## 2015-08-14 ENCOUNTER — Encounter (HOSPITAL_COMMUNITY): Payer: Self-pay | Admitting: Emergency Medicine

## 2015-08-14 DIAGNOSIS — R1030 Lower abdominal pain, unspecified: Secondary | ICD-10-CM | POA: Insufficient documentation

## 2015-08-14 DIAGNOSIS — R319 Hematuria, unspecified: Secondary | ICD-10-CM | POA: Diagnosis not present

## 2015-08-14 DIAGNOSIS — T83098A Other mechanical complication of other indwelling urethral catheter, initial encounter: Secondary | ICD-10-CM | POA: Insufficient documentation

## 2015-08-14 DIAGNOSIS — Y658 Other specified misadventures during surgical and medical care: Secondary | ICD-10-CM | POA: Insufficient documentation

## 2015-08-14 DIAGNOSIS — Z96642 Presence of left artificial hip joint: Secondary | ICD-10-CM | POA: Diagnosis not present

## 2015-08-14 DIAGNOSIS — R339 Retention of urine, unspecified: Secondary | ICD-10-CM | POA: Diagnosis present

## 2015-08-14 DIAGNOSIS — I10 Essential (primary) hypertension: Secondary | ICD-10-CM | POA: Insufficient documentation

## 2015-08-14 DIAGNOSIS — Z79899 Other long term (current) drug therapy: Secondary | ICD-10-CM | POA: Insufficient documentation

## 2015-08-14 DIAGNOSIS — T83021A Displacement of indwelling urethral catheter, initial encounter: Secondary | ICD-10-CM

## 2015-08-14 LAB — CBC WITH DIFFERENTIAL/PLATELET
BASOS ABS: 0 10*3/uL (ref 0.0–0.1)
Basophils Relative: 0 %
Eosinophils Absolute: 0 10*3/uL (ref 0.0–0.7)
Eosinophils Relative: 1 %
HCT: 32.2 % — ABNORMAL LOW (ref 39.0–52.0)
Hemoglobin: 10.1 g/dL — ABNORMAL LOW (ref 13.0–17.0)
LYMPHS PCT: 9 %
Lymphs Abs: 0.7 10*3/uL (ref 0.7–4.0)
MCH: 31 pg (ref 26.0–34.0)
MCHC: 31.4 g/dL (ref 30.0–36.0)
MCV: 98.8 fL (ref 78.0–100.0)
Monocytes Absolute: 0.9 10*3/uL (ref 0.1–1.0)
Monocytes Relative: 12 %
NEUTROS ABS: 5.8 10*3/uL (ref 1.7–7.7)
NEUTROS PCT: 79 %
PLATELETS: 213 10*3/uL (ref 150–400)
RBC: 3.26 MIL/uL — AB (ref 4.22–5.81)
RDW: 13.7 % (ref 11.5–15.5)
WBC: 7.4 10*3/uL (ref 4.0–10.5)

## 2015-08-14 LAB — URINALYSIS, ROUTINE W REFLEX MICROSCOPIC
Glucose, UA: NEGATIVE mg/dL
Ketones, ur: 15 mg/dL — AB
NITRITE: POSITIVE — AB
PROTEIN: 100 mg/dL — AB
Specific Gravity, Urine: 1.009 (ref 1.005–1.030)
pH: 7 (ref 5.0–8.0)

## 2015-08-14 LAB — BASIC METABOLIC PANEL
ANION GAP: 11 (ref 5–15)
BUN: 31 mg/dL — ABNORMAL HIGH (ref 6–20)
CO2: 23 mmol/L (ref 22–32)
Calcium: 9.8 mg/dL (ref 8.9–10.3)
Chloride: 104 mmol/L (ref 101–111)
Creatinine, Ser: 1.2 mg/dL (ref 0.61–1.24)
GFR, EST AFRICAN AMERICAN: 57 mL/min — AB (ref >60)
GFR, EST NON AFRICAN AMERICAN: 50 mL/min — AB (ref >60)
Glucose, Bld: 106 mg/dL — ABNORMAL HIGH (ref 65–99)
POTASSIUM: 4.9 mmol/L (ref 3.5–5.1)
SODIUM: 138 mmol/L (ref 135–145)

## 2015-08-14 LAB — URINE MICROSCOPIC-ADD ON

## 2015-08-14 LAB — PROTIME-INR
INR: 1.14 (ref 0.00–1.49)
Prothrombin Time: 14.8 seconds (ref 11.6–15.2)

## 2015-08-14 MED ORDER — CEPHALEXIN 500 MG PO CAPS: 500.0000 mg | ORAL_CAPSULE | Freq: Two times a day (BID) | ORAL | Status: DC

## 2015-08-14 MED ORDER — ACETAMINOPHEN 325 MG PO TABS
650.0000 mg | ORAL_TABLET | Freq: Once | ORAL | Status: AC
  Administered 2015-08-14: 650 mg via ORAL
  Filled 2015-08-14: qty 2

## 2015-08-14 MED ORDER — LIDOCAINE HCL 2 % EX GEL
1.0000 "application " | Freq: Once | CUTANEOUS | Status: DC
  Filled 2015-08-14: qty 20

## 2015-08-14 MED ORDER — CEPHALEXIN 250 MG PO CAPS
500.0000 mg | ORAL_CAPSULE | Freq: Once | ORAL | Status: AC
  Administered 2015-08-14: 500 mg via ORAL
  Filled 2015-08-14: qty 2

## 2015-08-14 NOTE — ED Provider Notes (Signed)
CSN: [id number]     Arrival date & time 08/14/15  1610 History   First MD Initiated Contact with Patient 08/14/15 1620     Chief Complaint  Patient presents with  . Urinary Retention     The history is provided by the nursing home and the patient. No language interpreter was used.   [name] Bennett is a 82 y.o. male who presents to the Emergency Department complaining of foley catheter replacement.  The patient has an in dwelling Foley catheter that is chronic. He had a catheter replacement yesterday. Today he complained of abdominal pain and bloody urine.  His catheter bulb was deflated slightly at 10:30 AM and his penile/abdominal pain decreased.There were clots of blood noted in his catheter at 2:30 PM. There was no urine present. the nursing home attempted to replace the Foley catheter and the patient complained of pain and clots of blood were noted in the urine and they were unable to fully pass the catheter. Catheter was removed. The patient currently complains of pain in his penis and some lower abdominal discomfort. His catheter has been replaced 6 times in the last month. History is provided by the patient and nursing home records. He denies fevers or vomiting, takes no blood thinners. Symptoms are moderate, constant, worsening.  Past Medical History  Diagnosis Date  . Hypertension   . Urinary retention   . H/O total hip arthroplasty 05/01/2013   Past Surgical History  Procedure Laterality Date  . Fracture surgery    . Hip surgery    . Hip arthroplasty Left 05/01/2013    Procedure: ARTHROPLASTY BIPOLAR HIP;  Surgeon: Shelda Pal, MD;  Location: [facility];  Service: Orthopedics;  Laterality: Left;   History reviewed. No pertinent family history. Social History  Substance Use Topics  . Smoking status: Never Smoker   . Smokeless tobacco: None  . Alcohol Use: No    Review of Systems  All other systems reviewed and are negative.     Allergies  Review of patient's allergies  indicates no known allergies.  Home Medications   Prior to Admission medications   Medication Sig Start Date End Date Taking? Authorizing Provider  acetaminophen (TYLENOL) 325 MG tablet Take 650 mg by mouth every 6 (six) hours as needed for mild pain or moderate pain. 05/03/13  Yes Jacques Navy, MD  ALPRAZolam Prudy Feeler) 0.25 MG tablet Take one tablet by mouth twice daily for anixety 08/07/15  Yes Tiffany L Reed, DO  amLODipine (NORVASC) 5 MG tablet Take 5 mg by mouth daily.   Yes Historical Provider, MD  Cholecalciferol (VITAMIN D3) 50000 units TABS Take 1 tablet by mouth every 30 (thirty) days.   Yes Historical Provider, MD  cyanocobalamin 1000 MCG tablet Take 100 mcg by mouth daily.   Yes Historical Provider, MD  feeding supplement (ENSURE COMPLETE) LIQD Take 237 mLs by mouth 3 (three) times daily with meals. 05/03/13  Yes Jacques Navy, MD  ferrous sulfate 325 (65 FE) MG tablet Take 325 mg by mouth 3 (three) times daily with meals. For anemia 05/03/13  Yes Jacques Navy, MD  losartan (COZAAR) 100 MG tablet Take 100 mg by mouth daily.   Yes Historical Provider, MD  Phenazopyridine HCl & UTI Test (URISTAT UTI RELIEF PAK) 95 MG THPK Take 1 tablet by mouth 3 (three) times daily. For penile pain   Yes Historical Provider, MD  polyethylene glycol (MIRALAX / GLYCOLAX) packet Take 17 g by mouth daily. For constipation  05/03/13  Yes Jacques NavyMichael E Norins, MD  cephALEXin (KEFLEX) 500 MG capsule Take 1 capsule (500 mg total) by mouth 2 (two) times daily. 08/14/15   Tilden FossaElizabeth Alante Weimann, MD  magnesium citrate SOLN Take 1 Bottle by mouth daily as needed for mild constipation. 05/03/13   Jacques NavyMichael E Norins, MD   BP 118/82 mmHg  Pulse 71  Temp(Src) 99.1 F (37.3 C) (Oral)  Resp 18  Ht 6\' 2"  (1.88 m)  Wt 150 lb (68.04 kg)  BMI 19.25 kg/m2  SpO2 99% Physical Exam  Constitutional: He is oriented to person, place, and time. He appears well-developed and well-nourished.  HENT:  Head: Normocephalic and atraumatic.   Cardiovascular: Normal rate and regular rhythm.   No murmur heard. Pulmonary/Chest: Effort normal and breath sounds normal. No respiratory distress.  Abdominal: Soft. There is no tenderness. There is no rebound and no guarding.  Genitourinary:  Small amount of blood at the urethral meatus.  Musculoskeletal: He exhibits no edema or tenderness.  Neurological: He is alert and oriented to person, place, and time.  Skin: Skin is warm and dry.  Psychiatric: He has a normal mood and affect. His behavior is normal.  Nursing note and vitals reviewed.   ED Course  Procedures (including critical care time) Labs Review Labs Reviewed  URINALYSIS, ROUTINE W REFLEX MICROSCOPIC (NOT AT [facility]) - Abnormal; Notable for the following:    Color, Urine BROWN (*)    APPearance TURBID (*)    Hgb urine dipstick LARGE (*)    Bilirubin Urine SMALL (*)    Ketones, ur 15 (*)    Protein, ur 100 (*)    Nitrite POSITIVE (*)    Leukocytes, UA LARGE (*)    All other components within normal limits  BASIC METABOLIC PANEL - Abnormal; Notable for the following:    Glucose, Bld 106 (*)    BUN 31 (*)    GFR calc non Af Amer 50 (*)    GFR calc Af Amer 57 (*)    All other components within normal limits  CBC WITH DIFFERENTIAL/PLATELET - Abnormal; Notable for the following:    RBC 3.26 (*)    Hemoglobin 10.1 (*)    HCT 32.2 (*)    All other components within normal limits  URINE MICROSCOPIC-ADD ON - Abnormal; Notable for the following:    Squamous Epithelial / LPF 0-5 (*)    Bacteria, UA MANY (*)    All other components within normal limits  URINE CULTURE  PROTIME-INR    Imaging Review No results found. I have personally reviewed and evaluated these images and lab results as part of my medical decision-making.   EKG Interpretation None      MDM   Final diagnoses:  Dislodged Foley catheter [facility](HCC)    Patient here for evaluation of hematuria and clots in his catheter. Foley catheter placed in the  emergency department with no gross clots, urine is flowing well. Urinalysis is concerning for urinary tract infection, will treat with antibiotics and cultures are sent. He is nontoxic appearing on examination. Discussed urology follow-up and home care for Foley catheter. Discussed with the patient's niece and power of attorney findings of studies and need for outpatient follow-up as well as return precautions.  Tilden FossaElizabeth Donnice Nielsen, MD 08/15/15 0120

## 2015-08-14 NOTE — Discharge Instructions (Signed)
[name] Bennett urine looked as if there might be a urinary tract infection present. Urine cultures were sent. Please contact his urologist for follow-up. If his urine cultures did not grow out bacteria his urologist or his family doctor can discontinue the antibiotics. Get him rechecked immediately if he develops fevers, change in mental status, decreased urinary output. Encourage oral fluid hydration.  Foley Catheter Care, Adult A Foley catheter is a soft, flexible tube that is placed into the bladder to drain urine. A Foley catheter may be inserted if:  You leak urine or are not able to control when you urinate (urinary incontinence).  You are not able to urinate when you need to (urinary retention).  You had prostate surgery or surgery on the genitals.  You have certain medical conditions, such as multiple sclerosis, dementia, or a spinal cord injury. If you are going home with a Foley catheter in place, follow the instructions below. TAKING CARE OF THE CATHETER  Wash your hands with soap and water.  Using mild soap and warm water on a clean washcloth:  Clean the area on your body closest to the catheter insertion site using a circular motion, moving away from the catheter. Never wipe toward the catheter because this could sweep bacteria up into the urethra and cause infection.  Remove all traces of soap. Pat the area dry with a clean towel. For males, reposition the foreskin.  Attach the catheter to your leg so there is no tension on the catheter. Use adhesive tape or a leg strap. If you are using adhesive tape, remove any sticky residue left behind by the previous tape you used.  Keep the drainage bag below the level of the bladder, but keep it off the floor.  Check throughout the day to be sure the catheter is working and urine is draining freely. Make sure the tubing does not become kinked.  Do not pull on the catheter or try to remove it. Pulling could damage internal  tissues. TAKING CARE OF THE DRAINAGE BAGS You will be given two drainage bags to take home. One is a large overnight drainage bag, and the other is a smaller leg bag that fits underneath clothing. You may wear the overnight bag at any time, but you should never wear the smaller leg bag at night. Follow the instructions below for how to empty, change, and clean your drainage bags. Emptying the Drainage Bag You must empty your drainage bag when it is  - full or at least 2-3 times a day.  Wash your hands with soap and water.  Keep the drainage bag below your hips, below the level of your bladder. This stops urine from going back into the tubing and into your bladder.  Hold the dirty bag over the toilet or a clean container.  Open the pour spout at the bottom of the bag and empty the urine into the toilet or container. Do not let the pour spout touch the toilet, container, or any other surface. Doing so can place bacteria on the bag, which can cause an infection.  Clean the pour spout with a gauze pad or cotton ball that has rubbing alcohol on it.  Close the pour spout.  Attach the bag to your leg with adhesive tape or a leg strap.  Wash your hands well. Changing the Drainage Bag Change your drainage bag once a month or sooner if it starts to smell bad or look dirty. Below are steps to follow when changing the  drainage bag.  Wash your hands with soap and water.  Pinch off the rubber catheter so that urine does not spill out.  Disconnect the catheter tube from the drainage tube at the connection valve. Do not let the tubes touch any surface.  Clean the end of the catheter tube with an alcohol wipe. Use a different alcohol wipe to clean the end of the drainage tube.  Connect the catheter tube to the drainage tube of the clean drainage bag.  Attach the new bag to the leg with adhesive tape or a leg strap. Avoid attaching the new bag too tightly.  Wash your hands well. Cleaning the  Drainage Bag 1. Wash your hands with soap and water. 2. Wash the bag in warm, soapy water. 3. Rinse the bag thoroughly with warm water. 4. Fill the bag with a solution of white vinegar and water (1 cup vinegar to 1 qt warm water [.2 L vinegar to 1 L warm water]). Close the bag and soak it for 30 minutes in the solution. 5. Rinse the bag with warm water. 6. Hang the bag to dry with the pour spout open and hanging downward. 7. Store the clean bag (once it is dry) in a clean plastic bag. 8. Wash your hands well. PREVENTING INFECTION  Wash your hands before and after handling your catheter.  Take showers daily and wash the area where the catheter enters your body. Do not take baths. Replace wet leg straps with dry ones, if this applies.  Do not use powders, sprays, or lotions on the genital area. Only use creams, lotions, or ointments as directed by your caregiver.  For females, wipe from front to back after each bowel movement.  Drink enough fluids to keep your urine clear or pale yellow unless you have a fluid restriction.  Do not let the drainage bag or tubing touch or lie on the floor.  Wear cotton underwear to absorb moisture and to keep your skin drier. SEEK MEDICAL CARE IF:   Your urine is cloudy or smells unusually bad.  Your catheter becomes clogged.  You are not draining urine into the bag or your bladder feels full.  Your catheter starts to leak. SEEK IMMEDIATE MEDICAL CARE IF:   You have pain, swelling, redness, or pus where the catheter enters the body.  You have pain in the abdomen, legs, lower back, or bladder.  You have a fever.  You see blood fill the catheter, or your urine is pink or red.  You have nausea, vomiting, or chills.  Your catheter gets pulled out. MAKE SURE YOU:   Understand these instructions.  Will watch your condition.  Will get help right away if you are not doing well or get worse.   This information is not intended to replace  advice given to you by your health care provider. Make sure you discuss any questions you have with your health care provider.   Document Released: 07/15/2005 Document Revised: 11/29/2013 Document Reviewed: 07/06/2012 Elsevier Interactive Patient Education 2016 [employer]  Urinary Tract Infection Urinary tract infections (UTIs) can develop anywhere along your urinary tract. Your urinary tract is your body's drainage system for removing wastes and extra water. Your urinary tract includes two kidneys, two ureters, a bladder, and a urethra. Your kidneys are a pair of bean-shaped organs. Each kidney is about the size of your fist. They are located below your ribs, one on each side of your spine. CAUSES Infections are caused by microbes, which  are microscopic organisms, including fungi, viruses, and bacteria. These organisms are so small that they can only be seen through a microscope. Bacteria are the microbes that most commonly cause UTIs. SYMPTOMS  Symptoms of UTIs may vary by age and gender of the patient and by the location of the infection. Symptoms in young women typically include a frequent and intense urge to urinate and a painful, burning feeling in the bladder or urethra during urination. Older women and men are more likely to be tired, shaky, and weak and have muscle aches and abdominal pain. A fever may mean the infection is in your kidneys. Other symptoms of a kidney infection include pain in your back or sides below the ribs, nausea, and vomiting. DIAGNOSIS To diagnose a UTI, your caregiver will ask you about your symptoms. Your caregiver will also ask you to provide a urine sample. The urine sample will be tested for bacteria and white blood cells. White blood cells are made by your body to help fight infection. TREATMENT  Typically, UTIs can be treated with medication. Because most UTIs are caused by a bacterial infection, they usually can be treated with the use of antibiotics. The  choice of antibiotic and length of treatment depend on your symptoms and the type of bacteria causing your infection. HOME CARE INSTRUCTIONS  If you were prescribed antibiotics, take them exactly as your caregiver instructs you. Finish the medication even if you feel better after you have only taken some of the medication.  Drink enough water and fluids to keep your urine clear or pale yellow.  Avoid caffeine, tea, and carbonated beverages. They tend to irritate your bladder.  Empty your bladder often. Avoid holding urine for long periods of time.  Empty your bladder before and after sexual intercourse.  After a bowel movement, women should cleanse from front to back. Use each tissue only once. SEEK MEDICAL CARE IF:   You have back pain.  You develop a fever.  Your symptoms do not begin to resolve within 3 days. SEEK IMMEDIATE MEDICAL CARE IF:   You have severe back pain or lower abdominal pain.  You develop chills.  You have nausea or vomiting.  You have continued burning or discomfort with urination. MAKE SURE YOU:   Understand these instructions.  Will watch your condition.  Will get help right away if you are not doing well or get worse.   This information is not intended to replace advice given to you by your health care provider. Make sure you discuss any questions you have with your health care provider.   Document Released: 04/24/2005 Document Revised: 04/05/2015 Document Reviewed: 08/23/2011 Elsevier Interactive Patient Education [employer]

## 2015-08-14 NOTE — ED Notes (Signed)
Pt here from [facility] for acute urinary retention. Pt has hx of 6 foleys this month, facility reports that they keep clogging with blood clots. Pt is a/o at this time. Sts pain in penis and abdomen.

## 2015-08-15 LAB — URINE CULTURE

## 2015-08-21 LAB — CBC AND DIFFERENTIAL
HEMATOCRIT: 32 % — AB (ref 41–53)
HEMOGLOBIN: 10.6 g/dL — AB (ref 13.5–17.5)
Platelets: 267 10*3/uL (ref 150–399)
WBC: 5.7 10*3/mL

## 2015-08-21 LAB — BASIC METABOLIC PANEL
BUN: 34 mg/dL — AB (ref 4–21)
CREATININE: 1.2 mg/dL (ref 0.6–1.3)
Potassium: 4.5 mmol/L (ref 3.4–5.3)
SODIUM: 134 mmol/L — AB (ref 137–147)

## 2015-09-25 ENCOUNTER — Encounter: Payer: Self-pay | Admitting: Internal Medicine

## 2015-09-25 ENCOUNTER — Non-Acute Institutional Stay (SKILLED_NURSING_FACILITY): Payer: Medicare Other | Admitting: Internal Medicine

## 2015-09-25 DIAGNOSIS — D509 Iron deficiency anemia, unspecified: Secondary | ICD-10-CM

## 2015-09-25 DIAGNOSIS — I10 Essential (primary) hypertension: Secondary | ICD-10-CM | POA: Diagnosis not present

## 2015-09-25 DIAGNOSIS — Z9289 Personal history of other medical treatment: Secondary | ICD-10-CM

## 2015-09-25 DIAGNOSIS — Z960 Presence of urogenital implants: Secondary | ICD-10-CM

## 2015-09-25 DIAGNOSIS — E43 Unspecified severe protein-calorie malnutrition: Secondary | ICD-10-CM

## 2015-09-25 DIAGNOSIS — Z978 Presence of other specified devices: Secondary | ICD-10-CM

## 2015-09-25 DIAGNOSIS — N401 Enlarged prostate with lower urinary tract symptoms: Secondary | ICD-10-CM | POA: Diagnosis not present

## 2015-09-25 DIAGNOSIS — K5900 Constipation, unspecified: Secondary | ICD-10-CM | POA: Diagnosis not present

## 2015-09-25 DIAGNOSIS — N182 Chronic kidney disease, stage 2 (mild): Secondary | ICD-10-CM | POA: Diagnosis not present

## 2015-09-25 DIAGNOSIS — N138 Other obstructive and reflux uropathy: Secondary | ICD-10-CM

## 2015-09-25 NOTE — Progress Notes (Signed)
Patient ID: ABDIFATAH COLQUHOUN, male   DOB: 23-Jul-1918, 82 y.o.   MRN: [id number]    DATE: 09/25/15  Location:  [facility]    Place of Service: SNF (31)   Extended Emergency Contact Information Primary Emergency Contact: Eldridge Scot [place] of [place] Phone: [phone] Relation: Niece Secondary Emergency Contact: Audie Pinto [place] of [place] Phone: [phone] Relation: Niece  Advanced Directive information  FULL CODE  Chief Complaint  Patient presents with  . Medical Management of Chronic Issues    [facility]    HPI:  82 yo male long term resident seen today for f/u. His sister is present. He has no c/o. No nursing issues. No falls. Appetite is excellent. He has a chronic foley due to BPH. He saw podiatry recently and had nail care due to onychomycosis  HTN - BP stable on losartan, norvasc.  Constipation - stable on miralax  Anemia - iron deficiency; stable on iron supplement  BPH with obstruction/urinary retention/chronic indwelling foley cath - followed by urology. He takes uristat  CKD - Cr stable at 1.2  He takes vitamins, minerals and nutritional supplement  He tries to get as much exercise as he cans on a daily basis. He ambulates with standard w/c.   Past Medical History  Diagnosis Date  . Hypertension   . Urinary retention   . H/O total hip arthroplasty 05/01/2013    Past Surgical History  Procedure Laterality Date  . Fracture surgery    . Hip surgery    . Hip arthroplasty Left 05/01/2013    Procedure: ARTHROPLASTY BIPOLAR HIP;  Surgeon: Mauri Pole, MD;  Location: [facility];  Service: Orthopedics;  Laterality: Left;    Patient Care Team: Hennie Duos, MD as PCP - General (Internal Medicine)  Social History   Social History  . Marital Status: Widowed    Spouse Name: N/A  . Number of Children: N/A  . Years of Education: N/A   Occupational History  . Not on file.   Social History Main Topics  .  Smoking status: Never Smoker   . Smokeless tobacco: Not on file  . Alcohol Use: No  . Drug Use: No  . Sexual Activity: Not on file   Other Topics Concern  . Not on file   Social History Narrative     reports that he has never smoked. He does not have any smokeless tobacco history on file. He reports that he does not drink alcohol or use illicit drugs.  Immunization History  Administered Date(s) Administered  . PPD Test 07/17/2013    No Known Allergies  Medications: Patient's Medications  New Prescriptions   No medications on file  Previous Medications   ACETAMINOPHEN (TYLENOL) 325 MG TABLET    Take 650 mg by mouth every 6 (six) hours as needed for mild pain or moderate pain.   ALPRAZOLAM (XANAX) 0.25 MG TABLET    Take one tablet by mouth twice daily for anixety   AMLODIPINE (NORVASC) 5 MG TABLET    Take 5 mg by mouth daily.   CEPHALEXIN (KEFLEX) 500 MG CAPSULE    Take 1 capsule (500 mg total) by mouth 2 (two) times daily.   CHOLECALCIFEROL (VITAMIN D3) 50000 UNITS TABS    Take 1 tablet by mouth every 30 (thirty) days.   CYANOCOBALAMIN 1000 MCG TABLET    Take 100 mcg by mouth daily.   FEEDING SUPPLEMENT (ENSURE COMPLETE) LIQD    Take 237 mLs by  mouth 3 (three) times daily with meals.   FERROUS SULFATE 325 (65 FE) MG TABLET    Take 325 mg by mouth 3 (three) times daily with meals. For anemia   LOSARTAN (COZAAR) 100 MG TABLET    Take 100 mg by mouth daily.   MAGNESIUM CITRATE SOLN    Take 1 Bottle by mouth daily as needed for mild constipation.   PHENAZOPYRIDINE HCL & UTI TEST (URISTAT UTI RELIEF PAK) 95 MG THPK    Take 1 tablet by mouth 3 (three) times daily. For penile pain   POLYETHYLENE GLYCOL (MIRALAX / GLYCOLAX) PACKET    Take 17 g by mouth daily. For constipation  Modified Medications   No medications on file  Discontinued Medications   No medications on file    Review of Systems  Constitutional: Negative for chills, activity change and fatigue.  HENT: Negative for  sore throat and trouble swallowing.   Eyes: Negative for visual disturbance.  Respiratory: Negative for cough, chest tightness and shortness of breath.   Cardiovascular: Negative for chest pain, palpitations and leg swelling.  Gastrointestinal: Negative for nausea, vomiting, abdominal pain and blood in stool.  Genitourinary: Positive for difficulty urinating (has chronic foley). Negative for urgency and frequency.  Musculoskeletal: Negative for arthralgias and gait problem.  Skin: Negative for rash.  Neurological: Negative for weakness and headaches.  Psychiatric/Behavioral: Negative for confusion and sleep disturbance. The patient is not nervous/anxious.     Filed Vitals:   09/25/15 1703  BP: 116/60  Pulse: 60  Temp: 98.5 F (36.9 C)  SpO2: 97%   There is no weight on file to calculate BMI.  Physical Exam  Constitutional: He is oriented to person, place, and time. He appears well-developed.  Frail appearing in NAD sitting on edge of bed  HENT:  Mouth/Throat: Oropharynx is clear and moist.  Eyes: Pupils are equal, round, and reactive to light. No scleral icterus.  Neck: Neck supple. Carotid bruit is not present. No thyromegaly present.  Cardiovascular: Normal rate, regular rhythm and intact distal pulses.  Exam reveals no gallop and no friction rub.   Murmur (1/6 sem) heard. no distal LE swelling. No calf TTP  Pulmonary/Chest: Effort normal and breath sounds normal. He has no wheezes. He has no rales. He exhibits no tenderness.  Abdominal: Soft. Bowel sounds are normal. He exhibits no distension, no abdominal bruit, no pulsatile midline mass and no mass. There is no tenderness. There is no rebound and no guarding.  Genitourinary:  Foley cath intact and DTG clear yellow urine  Musculoskeletal: He exhibits edema.  Thoracic kyphosis  Lymphadenopathy:    He has no cervical adenopathy.  Neurological: He is alert and oriented to person, place, and time.  Skin: Skin is warm and dry.  No rash noted.  Psychiatric: He has a normal mood and affect. His behavior is normal. Judgment and thought content normal.     Labs reviewed:  CBC Latest Ref Rng 08/21/2015 08/14/2015 01/17/2014  WBC - 5.7 7.4 4.9  Hemoglobin 13.5 - 17.5 g/dL 10.6(A) 10.1(L) 9.7(A)  Hematocrit 41 - 53 % 32(A) 32.2(L) 29(A)  Platelets 150 - 399 K/L 267 213 207    CMP Latest Ref Rng 08/21/2015 08/14/2015 02/04/2014  Glucose 65 - 99 mg/dL - 106(H) -  BUN 4 - 21 mg/dL 34(A) 31(H) 37(A)  Creatinine 0.6 - 1.3 mg/dL 1.2 1.20 1.0  Sodium 137 - 147 mmol/L 134(A) 138 136(A)  Potassium 3.4 - 5.3 mmol/L 4.5 4.9 4.8  Chloride  101 - 111 mmol/L - 104 -  CO2 22 - 32 mmol/L - 23 -  Calcium 8.9 - 10.3 mg/dL - 9.8 -  Total Protein 6.0 - 8.3 g/dL - - -  Total Bilirubin 0.3 - 1.2 mg/dL - - -  Alkaline Phos 39 - 117 U/L - - -  AST 0 - 37 U/L - - -  ALT ALBUMIN  0 - 53 U/L  3.9 - -     Admission on 08/14/2015, Discharged on 08/14/2015  Component Date Value Ref Range Status  . Color, Urine 08/14/2015 BROWN* YELLOW Final   BIOCHEMICALS MAY BE AFFECTED BY COLOR  . APPearance 08/14/2015 TURBID* CLEAR Final  . Specific Gravity, Urine 08/14/2015 1.009  1.005 - 1.030 Final  . pH 08/14/2015 7.0  5.0 - 8.0 Final  . Glucose, UA 08/14/2015 NEGATIVE  NEGATIVE mg/dL Final  . Hgb urine dipstick 08/14/2015 LARGE* NEGATIVE Final  . Bilirubin Urine 08/14/2015 SMALL* NEGATIVE Final  . Ketones, ur 08/14/2015 15* NEGATIVE mg/dL Final  . Protein, ur 08/14/2015 100* NEGATIVE mg/dL Final  . Nitrite 08/14/2015 POSITIVE* NEGATIVE Final  . Leukocytes, UA 08/14/2015 LARGE* NEGATIVE Final  . Specimen Description 08/14/2015 URINE, RANDOM   Final  . Special Requests 08/14/2015 NONE   Final  . Culture 08/14/2015 MULTIPLE SPECIES PRESENT, SUGGEST RECOLLECTION   Final  . Report Status 08/14/2015 08/15/2015 FINAL   Final  . Sodium 08/14/2015 138  135 - 145 mmol/L Final  . Potassium 08/14/2015 4.9  3.5 - 5.1 mmol/L Final  . Chloride  08/14/2015 104  101 - 111 mmol/L Final  . CO2 08/14/2015 23  22 - 32 mmol/L Final  . Glucose, Bld 08/14/2015 106* 65 - 99 mg/dL Final  . BUN 08/14/2015 31* 6 - 20 mg/dL Final  . Creatinine, Ser 08/14/2015 1.20  0.61 - 1.24 mg/dL Final  . Calcium 08/14/2015 9.8  8.9 - 10.3 mg/dL Final  . GFR calc non Af Amer 08/14/2015 50* >60 mL/min Final  . GFR calc Af Amer 08/14/2015 57* >60 mL/min Final   Comment: (NOTE) The eGFR has been calculated using the CKD EPI equation. This calculation has not been validated in all clinical situations. eGFR's persistently <60 mL/min signify possible Chronic Kidney Disease.   . Anion gap 08/14/2015 11  5 - 15 Final  . WBC 08/14/2015 7.4  4.0 - 10.5 K/uL Final  . RBC 08/14/2015 3.26* 4.22 - 5.81 MIL/uL Final  . Hemoglobin 08/14/2015 10.1* 13.0 - 17.0 g/dL Final  . HCT 08/14/2015 32.2* 39.0 - 52.0 % Final  . MCV 08/14/2015 98.8  78.0 - 100.0 fL Final  . MCH 08/14/2015 31.0  26.0 - 34.0 pg Final  . MCHC 08/14/2015 31.4  30.0 - 36.0 g/dL Final  . RDW 08/14/2015 13.7  11.5 - 15.5 % Final  . Platelets 08/14/2015 213  150 - 400 K/uL Final  . Neutrophils Relative % 08/14/2015 79   Final  . Neutro Abs 08/14/2015 5.8  1.7 - 7.7 K/uL Final  . Lymphocytes Relative 08/14/2015 9   Final  . Lymphs Abs 08/14/2015 0.7  0.7 - 4.0 K/uL Final  . Monocytes Relative 08/14/2015 12   Final  . Monocytes Absolute 08/14/2015 0.9  0.1 - 1.0 K/uL Final  . Eosinophils Relative 08/14/2015 1   Final  . Eosinophils Absolute 08/14/2015 0.0  0.0 - 0.7 K/uL Final  . Basophils Relative 08/14/2015 0   Final  . Basophils Absolute 08/14/2015 0.0  0.0 - 0.1 K/uL Final  .  Prothrombin Time 08/14/2015 14.8  11.6 - 15.2 seconds Final  . INR 08/14/2015 1.14  0.00 - 1.49 Final  . Squamous Epithelial / LPF 08/14/2015 0-5* NONE SEEN Final  . WBC, UA 08/14/2015 TOO NUMEROUS TO COUNT  0 - 5 WBC/hpf Final  . RBC / HPF 08/14/2015 TOO NUMEROUS TO COUNT  0 - 5 RBC/hpf Final  . Bacteria, UA 08/14/2015  MANY* NONE SEEN Final    No results found.   Assessment/Plan   ICD-9-CM ICD-10-CM   1. Essential hypertension, benign 401.1 I10   2. Protein-calorie malnutrition, severe ([place]) 262 E43   3. Iron deficiency anemia 280.9 D50.9   4. Chronic indwelling Foley catheter V45.89 Z92.89   5. Prostatic hypertrophy, benign, with obstruction 600.21 N40.1    599.69 N13.8   6. CKD (chronic kidney disease) stage 2, GFR 60-89 ml/min 585.2 N18.2   7. Constipation, unspecified constipation type 564.00 K59.00     [facility] NP to follow  Pt is medically stable on current tx plan. Continue current medications as ordered. PT/OT/ST as indicated. Will follow  Sharhonda Atwood S. Perlie Gold  [facility] [address] [place], [place] [address] [phone] Cell (Monday-Friday 8 AM - 5 PM) [phone] After 5 PM and follow prompts

## 2015-11-02 ENCOUNTER — Encounter: Payer: Self-pay | Admitting: Internal Medicine

## 2015-11-02 ENCOUNTER — Non-Acute Institutional Stay (SKILLED_NURSING_FACILITY): Payer: Medicare Other | Admitting: Internal Medicine

## 2015-11-02 DIAGNOSIS — E43 Unspecified severe protein-calorie malnutrition: Secondary | ICD-10-CM | POA: Diagnosis not present

## 2015-11-02 DIAGNOSIS — Z9289 Personal history of other medical treatment: Secondary | ICD-10-CM | POA: Diagnosis not present

## 2015-11-02 DIAGNOSIS — D509 Iron deficiency anemia, unspecified: Secondary | ICD-10-CM | POA: Diagnosis not present

## 2015-11-02 DIAGNOSIS — Z960 Presence of urogenital implants: Secondary | ICD-10-CM

## 2015-11-02 DIAGNOSIS — N138 Other obstructive and reflux uropathy: Secondary | ICD-10-CM | POA: Diagnosis not present

## 2015-11-02 DIAGNOSIS — I10 Essential (primary) hypertension: Secondary | ICD-10-CM

## 2015-11-02 DIAGNOSIS — Z978 Presence of other specified devices: Secondary | ICD-10-CM

## 2015-11-02 DIAGNOSIS — N401 Enlarged prostate with lower urinary tract symptoms: Secondary | ICD-10-CM

## 2015-11-02 DIAGNOSIS — R319 Hematuria, unspecified: Secondary | ICD-10-CM | POA: Diagnosis not present

## 2015-11-02 LAB — HEPATIC FUNCTION PANEL
ALK PHOS: 64 U/L (ref 25–125)
ALT: 12 U/L (ref 10–40)
AST: 19 U/L (ref 14–40)
Bilirubin, Total: 1 mg/dL

## 2015-11-02 LAB — CBC AND DIFFERENTIAL
HEMATOCRIT: 31 % — AB (ref 41–53)
HEMOGLOBIN: 10.3 g/dL — AB (ref 13.5–17.5)
Platelets: 232 10*3/uL (ref 150–399)
WBC: 3.4 10^3/mL

## 2015-11-02 LAB — BASIC METABOLIC PANEL
BUN: 37 mg/dL — AB (ref 4–21)
Creatinine: 1.4 mg/dL — AB (ref 0.6–1.3)
Glucose: 74 mg/dL
Potassium: 4.7 mmol/L (ref 3.4–5.3)
Sodium: 140 mmol/L (ref 137–147)

## 2015-11-02 NOTE — Progress Notes (Signed)
Patient ID: [name] Bennett, male   DOB: 28-Nov-1917, 82 y.o.   MRN: [id number]    DATE: 11/02/15  Location:  [facility] Room Number: 409 B Place of Service: SNF (31)   Extended Emergency Contact Information Primary Emergency Contact: Eldridge Scot States of [place] Phone: [phone] Relation: Niece Secondary Emergency Contact: Brackney,Beverly  [place] Phone: [phone] Relation: Niece  Advanced Directive information Does patient have an advance directive?: No, Would patient like information on creating an advanced directive?: No - patient declined information  FULL CODE  Chief Complaint  Patient presents with  . Medical Management of Chronic Issues; [facility]    HPI:  82 yo male long term resident seen today for f/u. He reports feeling poorly today. Nursing noted foley cath clogged and blood noted in tubing. When cath flushed and changed, gross hematuria present. Pt notes abdominal pain prior to cath change but none now. He has increased blood clots and blood in foley cath bag. No N/V, fever but has had decreased po intake and chills. [facility] NP present and has also seen pt.   HTN - BP stable on losartan, norvasc.  Constipation - stable on miralax  Anemia - iron deficiency; stable on iron supplement  BPH with obstruction/urinary retention/chronic indwelling foley cath - followed by urology. He takes uristat  CKD - Cr stable at 1.2  He takes vitamins, minerals and nutritional supplement  He tries to get as much exercise as he cans on a daily basis. He ambulates with standard w/c.    Past Medical History  Diagnosis Date  . Hypertension   . Urinary retention   . H/O total hip arthroplasty 05/01/2013    Past Surgical History  Procedure Laterality Date  . Fracture surgery    . Hip surgery    . Hip arthroplasty Left 05/01/2013    Procedure: ARTHROPLASTY BIPOLAR HIP;  Surgeon: Mauri Pole, MD;  Location: [facility];  Service: Orthopedics;  Laterality: Left;    Patient Care Team: Hennie Duos, MD as PCP - General (Internal Medicine)  Social History   Social History  . Marital Status: Widowed    Spouse Name: N/A  . Number of Children: N/A  . Years of Education: N/A   Occupational History  . Not on file.   Social History Main Topics  . Smoking status: Never Smoker   . Smokeless tobacco: Not on file  . Alcohol Use: No  . Drug Use: No  . Sexual Activity: Not on file   Other Topics Concern  . Not on file   Social History Narrative     reports that he has never smoked. He does not have any smokeless tobacco history on file. He reports that he does not drink alcohol or use illicit drugs.  Immunization History  Administered Date(s) Administered  . PPD Test 07/17/2013    No Known Allergies  Medications: Patient's Medications  New Prescriptions   No medications on file  Previous Medications   ACETAMINOPHEN (TYLENOL) 325 MG TABLET    Take 650 mg by mouth every 6 (six) hours as needed for mild pain or moderate pain.   ALPRAZOLAM (XANAX) 0.25 MG TABLET    Take one tablet by mouth twice daily for anixety   AMLODIPINE (NORVASC) 5 MG TABLET    Take 5 mg by mouth daily.   CHOLECALCIFEROL (VITAMIN D3) 50000 UNITS TABS    Take 1 tablet by mouth every 30 (  thirty) days.   CRANBERRY-VITAMIN C-INULIN (UTI-STAT) LIQD    Take 30 mLs by mouth daily.   CRANBERRY-VITAMIN C-INULIN (UTI-STAT) LIQD    Take 30 mLs by mouth daily.   CYANOCOBALAMIN 1000 MCG TABLET    Take 100 mcg by mouth daily.   FERROUS SULFATE 325 (65 FE) MG TABLET    Take 325 mg by mouth 3 (three) times daily with meals. For anemia   LOSARTAN (COZAAR) 100 MG TABLET    Take 100 mg by mouth daily.   MAGNESIUM CITRATE SOLN    Take 1 Bottle by mouth daily as needed for mild constipation.   PHENAZOPYRIDINE HCL & UTI TEST (URISTAT UTI RELIEF PAK) 95 MG THPK    Take 1 tablet by mouth 3 (three) times daily. For penile pain    POLYETHYLENE GLYCOL (MIRALAX / GLYCOLAX) PACKET    Take 17 g by mouth daily. For constipation  Modified Medications   No medications on file  Discontinued Medications   CEPHALEXIN (KEFLEX) 500 MG CAPSULE    Take 1 capsule (500 mg total) by mouth 2 (two) times daily.   FEEDING SUPPLEMENT (ENSURE COMPLETE) LIQD    Take 237 mLs by mouth 3 (three) times daily with meals.    Review of Systems  Constitutional: Positive for activity change, appetite change and fatigue. Negative for chills.  HENT: Negative for sore throat and trouble swallowing.   Eyes: Negative for visual disturbance.  Respiratory: Negative for cough, chest tightness and shortness of breath.   Cardiovascular: Negative for chest pain, palpitations and leg swelling.  Gastrointestinal: Positive for abdominal pain. Negative for nausea, vomiting and blood in stool.  Genitourinary: Positive for hematuria and difficulty urinating (has chronic foley). Negative for urgency and frequency.  Musculoskeletal: Negative for arthralgias and gait problem.  Skin: Negative for rash.  Neurological: Negative for weakness and headaches.  Psychiatric/Behavioral: Negative for confusion and sleep disturbance. The patient is not nervous/anxious.     Filed Vitals:   11/02/15 1048  BP: 115/69  Pulse: 68  Temp: 98.4 F (36.9 C)  TempSrc: Oral  Resp: 18  Height: 6' 2"  (1.88 m)  Weight: 143 lb 12.8 oz (65.227 kg)   Body mass index is 18.45 kg/(m^2).  Physical Exam  Constitutional: He is oriented to person, place, and time. He appears well-developed.  Frail appearing in NAD lying in bed, looks pale  HENT:  Mouth/Throat: No oropharyngeal exudate.  MMM dry  Eyes: Pupils are equal, round, and reactive to light. No scleral icterus.  Neck: Neck supple. Carotid bruit is not present. No thyromegaly present.  Cardiovascular: Normal rate, regular rhythm and intact distal pulses.  Exam reveals no gallop and no friction rub.   Murmur (1/6 sem) heard. no  distal LE swelling. No calf TTP  Pulmonary/Chest: Effort normal and breath sounds normal. He has no wheezes. He has no rales. He exhibits no tenderness.  Abdominal: Soft. Bowel sounds are normal. He exhibits no distension, no abdominal bruit, no pulsatile midline mass and no mass. There is no tenderness. There is no rebound and no guarding.  Genitourinary:  Foley cath intact and DTG grossly bloody urine with clots  Musculoskeletal: He exhibits edema.  Thoracic kyphosis  Lymphadenopathy:    He has no cervical adenopathy.  Neurological: He is alert and oriented to person, place, and time.  Skin: Skin is warm and dry. No rash noted.  Psychiatric: He has a normal mood and affect. His behavior is normal. Judgment and thought content normal.  Labs reviewed: Nursing Home on 09/25/2015  Component Date Value Ref Range Status  . Hemoglobin 08/21/2015 10.6* 13.5 - 17.5 g/dL Final  . HCT 08/21/2015 32* 41 - 53 % Final  . Platelets 08/21/2015 267  150 - 399 K/L Final  . WBC 08/21/2015 5.7   Final  . BUN 08/21/2015 34* 4 - 21 mg/dL Final  . Creatinine 08/21/2015 1.2  0.6 - 1.3 mg/dL Final  . Potassium 08/21/2015 4.5  3.4 - 5.3 mmol/L Final  . Sodium 08/21/2015 134* 137 - 147 mmol/L Final  Admission on 08/14/2015, Discharged on 08/14/2015  Component Date Value Ref Range Status  . Color, Urine 08/14/2015 BROWN* YELLOW Final   BIOCHEMICALS MAY BE AFFECTED BY COLOR  . APPearance 08/14/2015 TURBID* CLEAR Final  . Specific Gravity, Urine 08/14/2015 1.009  1.005 - 1.030 Final  . pH 08/14/2015 7.0  5.0 - 8.0 Final  . Glucose, UA 08/14/2015 NEGATIVE  NEGATIVE mg/dL Final  . Hgb urine dipstick 08/14/2015 LARGE* NEGATIVE Final  . Bilirubin Urine 08/14/2015 SMALL* NEGATIVE Final  . Ketones, ur 08/14/2015 15* NEGATIVE mg/dL Final  . Protein, ur 08/14/2015 100* NEGATIVE mg/dL Final  . Nitrite 08/14/2015 POSITIVE* NEGATIVE Final  . Leukocytes, UA 08/14/2015 LARGE* NEGATIVE Final  . Specimen  Description 08/14/2015 URINE, RANDOM   Final  . Special Requests 08/14/2015 NONE   Final  . Culture 08/14/2015 MULTIPLE SPECIES PRESENT, SUGGEST RECOLLECTION   Final  . Report Status 08/14/2015 08/15/2015 FINAL   Final  . Sodium 08/14/2015 138  135 - 145 mmol/L Final  . Potassium 08/14/2015 4.9  3.5 - 5.1 mmol/L Final  . Chloride 08/14/2015 104  101 - 111 mmol/L Final  . CO2 08/14/2015 23  22 - 32 mmol/L Final  . Glucose, Bld 08/14/2015 106* 65 - 99 mg/dL Final  . BUN 08/14/2015 31* 6 - 20 mg/dL Final  . Creatinine, Ser 08/14/2015 1.20  0.61 - 1.24 mg/dL Final  . Calcium 08/14/2015 9.8  8.9 - 10.3 mg/dL Final  . GFR calc non Af Amer 08/14/2015 50* >60 mL/min Final  . GFR calc Af Amer 08/14/2015 57* >60 mL/min Final   Comment: (NOTE) The eGFR has been calculated using the CKD EPI equation. This calculation has not been validated in all clinical situations. eGFR's persistently <60 mL/min signify possible Chronic Kidney Disease.   . Anion gap 08/14/2015 11  5 - 15 Final  . WBC 08/14/2015 7.4  4.0 - 10.5 K/uL Final  . RBC 08/14/2015 3.26* 4.22 - 5.81 MIL/uL Final  . Hemoglobin 08/14/2015 10.1* 13.0 - 17.0 g/dL Final  . HCT 08/14/2015 32.2* 39.0 - 52.0 % Final  . MCV 08/14/2015 98.8  78.0 - 100.0 fL Final  . MCH 08/14/2015 31.0  26.0 - 34.0 pg Final  . MCHC 08/14/2015 31.4  30.0 - 36.0 g/dL Final  . RDW 08/14/2015 13.7  11.5 - 15.5 % Final  . Platelets 08/14/2015 213  150 - 400 K/uL Final  . Neutrophils Relative % 08/14/2015 79   Final  . Neutro Abs 08/14/2015 5.8  1.7 - 7.7 K/uL Final  . Lymphocytes Relative 08/14/2015 9   Final  . Lymphs Abs 08/14/2015 0.7  0.7 - 4.0 K/uL Final  . Monocytes Relative 08/14/2015 12   Final  . Monocytes Absolute 08/14/2015 0.9  0.1 - 1.0 K/uL Final  . Eosinophils Relative 08/14/2015 1   Final  . Eosinophils Absolute 08/14/2015 0.0  0.0 - 0.7 K/uL Final  . Basophils Relative 08/14/2015 0  Final  . Basophils Absolute 08/14/2015 0.0  0.0 - 0.1 K/uL  Final  . Prothrombin Time 08/14/2015 14.8  11.6 - 15.2 seconds Final  . INR 08/14/2015 1.14  0.00 - 1.49 Final  . Squamous Epithelial / LPF 08/14/2015 0-5* NONE SEEN Final  . WBC, UA 08/14/2015 TOO NUMEROUS TO COUNT  0 - 5 WBC/hpf Final  . RBC / HPF 08/14/2015 TOO NUMEROUS TO COUNT  0 - 5 RBC/hpf Final  . Bacteria, UA 08/14/2015 MANY* NONE SEEN Final    No results found.   Assessment/Plan   ICD-9-CM ICD-10-CM   1. Hematuria 599.70 R31.9   2. Chronic indwelling Foley catheter V45.89 Z92.89   3. Prostatic hypertrophy, benign, with obstruction 600.21 N40.1    599.69 N13.8   4. Protein-calorie malnutrition, severe ([place]) 262 E43   5. Essential hypertension, benign 401.1 I10   6. Iron deficiency anemia 280.9 D50.9     He may need IVF for dehydration pending CMP results  [facility] NP saw pt also and labs (CBC w diff, CMP, UA c& s) ordered stat  Pt has been given 1gm IM rocephin after UA taken. Will start ceftin 228m po BID tomorrow x 5 days.  Cont other meds as ordered  F/u with urology  PT/OT/ST as ordered  Will follow  Faduma Cho S. CPerlie Gold PThe [facility] LLCand Adult [facility] [address] [phone] (Monday-Friday 8 AM - 5 PM) ([phone] 5 PM and follow prompts

## 2015-11-03 ENCOUNTER — Encounter (HOSPITAL_COMMUNITY): Payer: Self-pay

## 2015-11-03 ENCOUNTER — Inpatient Hospital Stay (HOSPITAL_COMMUNITY)
Admission: EM | Admit: 2015-11-03 | Discharge: 2015-11-05 | DRG: 695 | Disposition: A | Discharge status: Skilled Nursing Facility | Payer: Medicare Other | Attending: Internal Medicine | Admitting: Internal Medicine

## 2015-11-03 DIAGNOSIS — I714 Abdominal aortic aneurysm, without rupture, unspecified: Secondary | ICD-10-CM | POA: Diagnosis present

## 2015-11-03 DIAGNOSIS — E860 Dehydration: Secondary | ICD-10-CM

## 2015-11-03 DIAGNOSIS — I10 Essential (primary) hypertension: Secondary | ICD-10-CM | POA: Diagnosis present

## 2015-11-03 DIAGNOSIS — D509 Iron deficiency anemia, unspecified: Secondary | ICD-10-CM | POA: Diagnosis present

## 2015-11-03 DIAGNOSIS — R310 Gross hematuria: Secondary | ICD-10-CM | POA: Diagnosis not present

## 2015-11-03 DIAGNOSIS — D62 Acute posthemorrhagic anemia: Secondary | ICD-10-CM | POA: Diagnosis present

## 2015-11-03 DIAGNOSIS — R319 Hematuria, unspecified: Secondary | ICD-10-CM | POA: Diagnosis present

## 2015-11-03 DIAGNOSIS — N289 Disorder of kidney and ureter, unspecified: Secondary | ICD-10-CM

## 2015-11-03 DIAGNOSIS — Z960 Presence of urogenital implants: Secondary | ICD-10-CM

## 2015-11-03 DIAGNOSIS — Z9289 Personal history of other medical treatment: Secondary | ICD-10-CM | POA: Diagnosis not present

## 2015-11-03 DIAGNOSIS — N390 Urinary tract infection, site not specified: Secondary | ICD-10-CM | POA: Diagnosis present

## 2015-11-03 DIAGNOSIS — N138 Other obstructive and reflux uropathy: Secondary | ICD-10-CM | POA: Diagnosis present

## 2015-11-03 DIAGNOSIS — E43 Unspecified severe protein-calorie malnutrition: Secondary | ICD-10-CM | POA: Diagnosis present

## 2015-11-03 DIAGNOSIS — N179 Acute kidney failure, unspecified: Secondary | ICD-10-CM

## 2015-11-03 DIAGNOSIS — Z978 Presence of other specified devices: Secondary | ICD-10-CM

## 2015-11-03 DIAGNOSIS — Z9689 Presence of other specified functional implants: Secondary | ICD-10-CM | POA: Diagnosis present

## 2015-11-03 DIAGNOSIS — Z87891 Personal history of nicotine dependence: Secondary | ICD-10-CM

## 2015-11-03 DIAGNOSIS — Z79899 Other long term (current) drug therapy: Secondary | ICD-10-CM

## 2015-11-03 DIAGNOSIS — Z96642 Presence of left artificial hip joint: Secondary | ICD-10-CM | POA: Diagnosis present

## 2015-11-03 DIAGNOSIS — N401 Enlarged prostate with lower urinary tract symptoms: Secondary | ICD-10-CM

## 2015-11-03 DIAGNOSIS — Z681 Body mass index (BMI) 19 or less, adult: Secondary | ICD-10-CM

## 2015-11-03 DIAGNOSIS — T83511A Infection and inflammatory reaction due to indwelling urethral catheter, initial encounter: Secondary | ICD-10-CM

## 2015-11-03 LAB — CBC WITH DIFFERENTIAL/PLATELET
Basophils Absolute: 0 10*3/uL (ref 0.0–0.1)
Basophils Relative: 0 %
EOS PCT: 0 %
Eosinophils Absolute: 0 10*3/uL (ref 0.0–0.7)
HEMATOCRIT: 27.4 % — AB (ref 39.0–52.0)
HEMOGLOBIN: 9 g/dL — AB (ref 13.0–17.0)
LYMPHS ABS: 0.7 10*3/uL (ref 0.7–4.0)
LYMPHS PCT: 3 %
MCH: 31.5 pg (ref 26.0–34.0)
MCHC: 32.8 g/dL (ref 30.0–36.0)
MCV: 95.8 fL (ref 78.0–100.0)
MONOS PCT: 6 %
Monocytes Absolute: 1.4 10*3/uL — ABNORMAL HIGH (ref 0.1–1.0)
Neutro Abs: 20.7 10*3/uL — ABNORMAL HIGH (ref 1.7–7.7)
Neutrophils Relative %: 91 %
Platelets: 166 10*3/uL (ref 150–400)
RBC: 2.86 MIL/uL — AB (ref 4.22–5.81)
RDW: 14.7 % (ref 11.5–15.5)
WBC MORPHOLOGY: INCREASED
WBC: 22.8 10*3/uL — AB (ref 4.0–10.5)

## 2015-11-03 LAB — I-STAT CHEM 8, ED
BUN: 44 mg/dL — AB (ref 6–20)
CREATININE: 1.7 mg/dL — AB (ref 0.61–1.24)
Calcium, Ion: 1.14 mmol/L (ref 1.13–1.30)
Chloride: 103 mmol/L (ref 101–111)
GLUCOSE: 72 mg/dL (ref 65–99)
HEMATOCRIT: 30 % — AB (ref 39.0–52.0)
Hemoglobin: 10.2 g/dL — ABNORMAL LOW (ref 13.0–17.0)
POTASSIUM: 5 mmol/L (ref 3.5–5.1)
Sodium: 136 mmol/L (ref 135–145)
TCO2: 21 mmol/L (ref 0–100)

## 2015-11-03 LAB — URINALYSIS, ROUTINE W REFLEX MICROSCOPIC
Glucose, UA: NEGATIVE mg/dL
Ketones, ur: 15 mg/dL — AB
Nitrite: POSITIVE — AB
Protein, ur: >300 mg/dL — AB
SPECIFIC GRAVITY, URINE: 1.013 (ref 1.005–1.030)
pH: 8 (ref 5.0–8.0)

## 2015-11-03 LAB — URINE MICROSCOPIC-ADD ON

## 2015-11-03 MED ORDER — DEXTROSE 5 % IV SOLN
1.0000 g | INTRAVENOUS | Status: DC
  Administered 2015-11-03 – 2015-11-04 (×2): 1 g via INTRAVENOUS
  Filled 2015-11-03 (×4): qty 10

## 2015-11-03 MED ORDER — AMLODIPINE BESYLATE 5 MG PO TABS
5.0000 mg | ORAL_TABLET | Freq: Every day | ORAL | Status: DC
  Administered 2015-11-03 – 2015-11-05 (×3): 5 mg via ORAL
  Filled 2015-11-03 (×3): qty 1

## 2015-11-03 MED ORDER — ALBUTEROL SULFATE (2.5 MG/3ML) 0.083% IN NEBU
2.5000 mg | INHALATION_SOLUTION | RESPIRATORY_TRACT | Status: DC | PRN
Start: 2015-11-03 — End: 2015-11-05

## 2015-11-03 MED ORDER — UTI-STAT PO LIQD: 30.0000 mL | Freq: Every day | ORAL | Status: DC

## 2015-11-03 MED ORDER — PRO-STAT SUGAR FREE PO LIQD
30.0000 mL | Freq: Two times a day (BID) | ORAL | Status: DC
  Administered 2015-11-03 – 2015-11-05 (×4): 30 mL via ORAL
  Filled 2015-11-03 (×4): qty 30

## 2015-11-03 MED ORDER — ACETAMINOPHEN 325 MG PO TABS
650.0000 mg | ORAL_TABLET | Freq: Once | ORAL | Status: AC
  Administered 2015-11-03: 650 mg via ORAL
  Filled 2015-11-03: qty 2

## 2015-11-03 MED ORDER — SODIUM CHLORIDE 0.9 % IV BOLUS (SEPSIS)
500.0000 mL | Freq: Once | INTRAVENOUS | Status: AC
  Administered 2015-11-03: 500 mL via INTRAVENOUS

## 2015-11-03 MED ORDER — FERROUS SULFATE 325 (65 FE) MG PO TABS
325.0000 mg | ORAL_TABLET | Freq: Three times a day (TID) | ORAL | Status: DC
  Administered 2015-11-03 – 2015-11-05 (×6): 325 mg via ORAL
  Filled 2015-11-03 (×6): qty 1

## 2015-11-03 MED ORDER — VITAMIN B-12 1000 MCG PO TABS
1000.0000 ug | ORAL_TABLET | Freq: Every day | ORAL | Status: DC
  Administered 2015-11-03 – 2015-11-05 (×3): 1000 ug via ORAL
  Filled 2015-11-03 (×3): qty 1

## 2015-11-03 MED ORDER — ENSURE ENLIVE PO LIQD
237.0000 mL | Freq: Two times a day (BID) | ORAL | Status: DC
  Administered 2015-11-04: 237 mL via ORAL

## 2015-11-03 MED ORDER — VITAMIN C 500 MG PO TABS
500.0000 mg | ORAL_TABLET | Freq: Two times a day (BID) | ORAL | Status: DC
  Administered 2015-11-03 – 2015-11-05 (×4): 500 mg via ORAL
  Filled 2015-11-03 (×4): qty 1

## 2015-11-03 MED ORDER — ALPRAZOLAM 0.25 MG PO TABS: 0.2500 mg | ORAL_TABLET | Freq: Two times a day (BID) | ORAL | Status: DC | PRN

## 2015-11-03 MED ORDER — SODIUM CHLORIDE 0.9 % IV SOLN
INTRAVENOUS | Status: DC
  Administered 2015-11-03 – 2015-11-04 (×2): via INTRAVENOUS
  Administered 2015-11-04 – 2015-11-05 (×2): 1 mL via INTRAVENOUS

## 2015-11-03 MED ORDER — VITAMIN D3 1.25 MG (50000 UT) PO TABS: 1.0000 | ORAL_TABLET | ORAL | Status: DC

## 2015-11-03 NOTE — Progress Notes (Signed)
Ceftriaxone for UTI per pharmacy ordered.  Ceftriaxone does not need renal adjustment.  [employer] policy allows pharmacy to change the ordered dose based on indication without contacting the physician, therefore a consult is not needed.   Plan: -ceftriaxone 1g IV q24h -pharmacy to sign off as no adjustment needed.  

## 2015-11-03 NOTE — ED Notes (Signed)
Original foley d/c as dorected by Dr. Trinidad CuretPicering. Pt voids urine and gross blood. Dr. Rubin PayorPickering informed of asmae. States to continue with attempt to replace foley and irrigate. 18g. Foley attempted and unable to pass. Dr. Rubin PayorPickering informed and states to attempt coude.

## 2015-11-03 NOTE — ED Notes (Signed)
Pt arrives from NH by EMS with c/o blood noted in foley bag since last night. Pt denies pain.

## 2015-11-03 NOTE — H&P (Signed)
Patient Demographics  [name] Bennett, is a 82 y.o. male  MRN: [id number]   DOB - 1918-06-08  Admit Date - 11/03/2015  Outpatient Primary MD for the patient is Margit HanksALEXANDER, ANNE D, MD   With History of -  Past Medical History  Diagnosis Date  . Hypertension   . Urinary retention   . H/O total hip arthroplasty 05/01/2013      Past Surgical History  Procedure Laterality Date  . Fracture surgery    . Hip surgery    . Hip arthroplasty Left 05/01/2013    Procedure: ARTHROPLASTY BIPOLAR HIP;  Surgeon: Shelda PalMatthew D Olin, MD;  Location: [facility];  Service: Orthopedics;  Laterality: Left;    in for   Chief Complaint  Patient presents with  . Hematuria     HPI  [name] Bennett  is a 82 y.o. male, With past medical history of hypertension, urinary retention, chronic indwelling Foley catheter, sent from a facility for hematuria, patient with known history of chronic Foley catheter secondary to urinary retention, hematuria noticed yesterday, received IV Rocephin at a facility yesterday, patient with most confusion today, so he was sent by facility, NAD patient with gross hematuria initially, Foley catheter was changed, no evidence of urinary retention or obstruction after new Foley catheter inserted, with a significant for hematuria and UTI, started on IV Rocephin after urine cultures were sent, blood work significant for acute renal failure with a creatinine of 1.7, I was called to admit for IV hydration and UTI treatment.    Review of Systems    In addition to the HPI above,  No Fever-chills, No Headache, No changes with Vision or hearing, No problems swallowing food or Liquids, No Chest pain, Cough or Shortness of Breath, Complains of abdominal pain initially, but currently he denies any, No Nausea or Vommitting, Bowel movements are regular, No Blood in stool or Urine, No dysuria, but had hematuria and Foley catheter No new skin rashes or bruises, No new joints pains-aches,  No new  weakness, tingling, numbness in any extremity, No recent weight gain or loss, No polyuria, polydypsia or polyphagia, No significant Mental Stressors.  A full 10 point Review of Systems was done, except as stated above, all other Review of Systems were negative.   Social History Social History  Substance Use Topics  . Smoking status: Former Smoker    Quit date: 11/02/1952  . Smokeless tobacco: Not on file  . Alcohol Use: No     Family History Family history was reviewed, no pertinent family history  Prior to Admission medications   Medication Sig Start Date End Date Taking? Authorizing Provider  acetaminophen (TYLENOL) 325 MG tablet Take 650 mg by mouth every 6 (six) hours as needed for mild pain or moderate pain. 05/03/13  Yes Jacques NavyMichael E Norins, MD  ALPRAZolam Prudy Feeler(XANAX) 0.25 MG tablet Take one tablet by mouth twice daily for anixety 08/07/15  Yes Tiffany L Reed, DO  Amino Acids-Protein Hydrolys (FEEDING SUPPLEMENT, PRO-STAT SUGAR FREE 64,) LIQD Take 30 mLs by mouth 2 (two) times daily.   Yes Historical Provider, MD  amLODipine (NORVASC) 5 MG tablet Take 5 mg by mouth daily.   Yes Historical Provider, MD  Cholecalciferol (VITAMIN D3) 50000 units TABS Take 1 tablet by mouth every 30 (thirty) days.   Yes Historical Provider, MD  Cranberry-Vitamin C-Inulin (UTI-STAT) LIQD Take 30 mLs by mouth daily.   Yes Historical Provider, MD  cyanocobalamin 1000 MCG tablet Take 1,000 mcg by mouth daily.  Yes Historical Provider, MD  ferrous sulfate 325 (65 FE) MG tablet Take 325 mg by mouth 3 (three) times daily with meals. For anemia 05/03/13  Yes Jacques Navy, MD  losartan (COZAAR) 100 MG tablet Take 100 mg by mouth daily.   Yes Historical Provider, MD  phenazopyridine (URISTAT) 95 MG tablet Take 95 mg by mouth 3 (three) times daily.   Yes Historical Provider, MD  polyethylene glycol (MIRALAX / GLYCOLAX) packet Take 17 g by mouth daily. For constipation 05/03/13  Yes Jacques Navy, MD  vitamin C  (ASCORBIC ACID) 500 MG tablet Take 500 mg by mouth 2 (two) times daily.   Yes Historical Provider, MD    No Known Allergies  Physical Exam  Vitals  Blood pressure 131/116, pulse 93, temperature 97.5 F (36.4 C), temperature source Oral, resp. rate 18, SpO2 95 %.   1. General Frail elderly male lying in bed in NAD,    2. Normal affect and insight, Not Suicidal or Homicidal, Awake Alert, Oriented X 2.  3. No F.N deficits, ALL C.Nerves Intact, motor strength grossly intact, Plantars down going.  4. Ears and Eyes appear Normal, Conjunctivae clear, PERRLA. Dry Oral Mucosa.  5. Supple Neck, No JVD, No cervical lymphadenopathy appriciated, No Carotid Bruits.  6. Symmetrical Chest wall movement, Good air movement bilaterally, CTAB.  7. RRR, No Gallops, Rubs or Murmurs, No Parasternal Heave.  8. Positive Bowel Sounds, Abdomen Soft, No tenderness, No organomegaly appriciated, Foley catheter draining light pink urine..  9.  No Cyanosis, Normal Skin Turgor, No Skin Rash or Bruise.  10. Good muscle tone,  joints appear normal , no effusions, Normal ROM.    Data Review  CBC  Recent Labs Lab 11/03/15 1054 11/03/15 1106  WBC 22.8*  --   HGB 9.0* 10.2*  HCT 27.4* 30.0*  PLT 166  --   MCV 95.8  --   MCH 31.5  --   MCHC 32.8  --   RDW 14.7  --   LYMPHSABS 0.7  --   MONOABS 1.4*  --   EOSABS 0.0  --   BASOSABS 0.0  --    ------------------------------------------------------------------------------------------------------------------  Chemistries   Recent Labs Lab 11/03/15 1106  NA 136  K 5.0  CL 103  GLUCOSE 72  BUN 44*  CREATININE 1.70*   ------------------------------------------------------------------------------------------------------------------ estimated creatinine clearance is 23.4 mL/min (by C-G formula based on Cr of 1.7). ------------------------------------------------------------------------------------------------------------------ No results for  input(s): TSH, T4TOTAL, T3FREE, THYROIDAB in the last 72 hours.  Invalid input(s): FREET3   Coagulation profile No results for input(s): INR, PROTIME in the last 168 hours. ------------------------------------------------------------------------------------------------------------------- No results for input(s): DDIMER in the last 72 hours. -------------------------------------------------------------------------------------------------------------------  Cardiac Enzymes No results for input(s): CKMB, TROPONINI, MYOGLOBIN in the last 168 hours.  Invalid input(s): CK ------------------------------------------------------------------------------------------------------------------ Invalid input(s): POCBNP   ---------------------------------------------------------------------------------------------------------------  Urinalysis    Component Value Date/Time   COLORURINE RED* 11/03/2015 1235   APPEARANCEUR TURBID* 11/03/2015 1235   LABSPEC 1.013 11/03/2015 1235   PHURINE 8.0 11/03/2015 1235   GLUCOSEU NEGATIVE 11/03/2015 1235   HGBUR LARGE* 11/03/2015 1235   BILIRUBINUR MODERATE* 11/03/2015 1235   KETONESUR 15* 11/03/2015 1235   PROTEINUR >300* 11/03/2015 1235   UROBILINOGEN 4.0* 11/14/2013 0820   NITRITE POSITIVE* 11/03/2015 1235   LEUKOCYTESUR LARGE* 11/03/2015 1235    ----------------------------------------------------------------------------------------------------------------  Imaging results:   No results found.    Assessment & Plan  Active Problems:   Essential hypertension, benign   Hematuria   UTI (lower  urinary tract infection)   Anemia, iron deficiency   Prostatic hypertrophy, benign, with obstruction   Protein-calorie malnutrition, severe (HCC)   Chronic indwelling Foley catheter   Acute renal failure (ARF) (HCC)   Hematuria - Patient with chronic indwelling Foley catheter, unclear if hematuria is related to manipulation of his Foley, but  currently Foley catheter has been changed in ED, significant improvement, currently has light pink urine.  UTI - Continue with IV Rocephin, follow on urine cultures - Leukocytosis most likely related to UTI, patient is afebrile, no tachycardia, no evidence of sepsis.  Acute renal failure - Most likely prerenal azotemia secondary to UTI, continue with IV fluids, monitor closely, hold losartan  Hypertension - Continue with Norvasc, hold losartan  Iron deficiency anemia - Continue with by mouth iron  Urinary retention/chronic indwelling Foley catheter - Foley catheter was changed in ED  Protein calorie malnutrition - Will start on ensure   DVT Prophylaxis SCDs, will hold on Lovenox until hematuria resolves.  AM Labs Ordered, also please review Full Orders  Family Communication: Admission, patients condition and plan of care including tests being ordered have been discussed with the patient  who indicate understanding and agree with the plan and Code Status.  Code Status  Full  Likely DC to  SNF  Condition GUARDED   Time spent in minutes : 55 minutes    Torin Modica M.D on 11/03/2015 at 2:33 PM  Between 7am to 7pm - Pager - [phone]  After 7pm go to [email or web address] - password [email or web address]  And look for the night coverage person covering me after hours  [facility] Office  [phone]

## 2015-11-03 NOTE — ED Provider Notes (Signed)
CSN: [id number]     Arrival date & time 11/03/15  1032 History   First MD Initiated Contact with Patient 11/03/15 1037     Chief Complaint  Patient presents with  . Hematuria      Patient is a 82 y.o. male presenting with hematuria. The history is provided by the patient.  Hematuria This is a recurrent problem. Associated symptoms include abdominal pain. Pertinent negatives include no chest pain and no shortness of breath.  Patient presents with hematuria. Comes from nursing home. Reportedly had bleeding last night. Patient states he is having difficulty urinating and there was issues with it flowing. Reportedly got IV fluids. Denies fevers. Has a chronic Foley due to urinary retention.  Past Medical History  Diagnosis Date  . Hypertension   . Urinary retention   . H/O total hip arthroplasty 05/01/2013   Past Surgical History  Procedure Laterality Date  . Fracture surgery    . Hip surgery    . Hip arthroplasty Left 05/01/2013    Procedure: ARTHROPLASTY BIPOLAR HIP;  Surgeon: Shelda PalMatthew D Olin, MD;  Location: [facility];  Service: Orthopedics;  Laterality: Left;   No family history on file. Social History  Substance Use Topics  . Smoking status: Former Smoker    Quit date: 11/02/1952  . Smokeless tobacco: None  . Alcohol Use: No    Review of Systems  Constitutional: Negative for appetite change.  Respiratory: Negative for shortness of breath.   Cardiovascular: Negative for chest pain.  Gastrointestinal: Positive for abdominal pain.  Genitourinary: Positive for hematuria.  Musculoskeletal: Negative for back pain.  Skin: Negative for wound.  Neurological: Negative for light-headedness.  Hematological: Negative for adenopathy.  Psychiatric/Behavioral: Negative for behavioral problems.      Allergies  Review of patient's allergies indicates no known allergies.  Home Medications   Prior to Admission medications   Medication Sig Start Date End Date Taking? Authorizing Provider   acetaminophen (TYLENOL) 325 MG tablet Take 650 mg by mouth every 6 (six) hours as needed for mild pain or moderate pain. 05/03/13  Yes Jacques NavyMichael E Norins, MD  ALPRAZolam Prudy Feeler(XANAX) 0.25 MG tablet Take one tablet by mouth twice daily for anixety 08/07/15  Yes Tiffany L Reed, DO  Amino Acids-Protein Hydrolys (FEEDING SUPPLEMENT, PRO-STAT SUGAR FREE 64,) LIQD Take 30 mLs by mouth 2 (two) times daily.   Yes Historical Provider, MD  amLODipine (NORVASC) 5 MG tablet Take 5 mg by mouth daily.   Yes Historical Provider, MD  Cholecalciferol (VITAMIN D3) 50000 units TABS Take 1 tablet by mouth every 30 (thirty) days.   Yes Historical Provider, MD  Cranberry-Vitamin C-Inulin (UTI-STAT) LIQD Take 30 mLs by mouth daily.   Yes Historical Provider, MD  cyanocobalamin 1000 MCG tablet Take 1,000 mcg by mouth daily.    Yes Historical Provider, MD  ferrous sulfate 325 (65 FE) MG tablet Take 325 mg by mouth 3 (three) times daily with meals. For anemia 05/03/13  Yes Jacques NavyMichael E Norins, MD  losartan (COZAAR) 100 MG tablet Take 100 mg by mouth daily.   Yes Historical Provider, MD  phenazopyridine (URISTAT) 95 MG tablet Take 95 mg by mouth 3 (three) times daily.   Yes Historical Provider, MD  polyethylene glycol (MIRALAX / GLYCOLAX) packet Take 17 g by mouth daily. For constipation 05/03/13  Yes Jacques NavyMichael E Norins, MD  vitamin C (ASCORBIC ACID) 500 MG tablet Take 500 mg by mouth 2 (two) times daily.   Yes Historical Provider, MD   BP 102/52  mmHg  Pulse 88  Temp(Src) 97.5 F (36.4 C) (Oral)  Resp 18  SpO2 96% Physical Exam  Constitutional: He appears well-developed.  HENT:  Head: Atraumatic.  Cardiovascular: Normal rate.   Pulmonary/Chest: Effort normal.  Abdominal: There is tenderness.  Mild tenderness in suprapubic area.   Genitourinary:  Chronic Foley. Some blood at the meatus.  Musculoskeletal: He exhibits no edema.  Neurological: He is alert.  Skin: Skin is warm.    ED Course  Procedures (including critical care  time) Labs Review Labs Reviewed  URINALYSIS, ROUTINE W REFLEX MICROSCOPIC (NOT AT [facility]) - Abnormal; Notable for the following:    Color, Urine RED (*)    APPearance TURBID (*)    Hgb urine dipstick LARGE (*)    Bilirubin Urine MODERATE (*)    Ketones, ur 15 (*)    Protein, ur >300 (*)    Nitrite POSITIVE (*)    Leukocytes, UA LARGE (*)    All other components within normal limits  CBC WITH DIFFERENTIAL/PLATELET - Abnormal; Notable for the following:    WBC 22.8 (*)    RBC 2.86 (*)    Hemoglobin 9.0 (*)    HCT 27.4 (*)    Neutro Abs 20.7 (*)    Monocytes Absolute 1.4 (*)    All other components within normal limits  URINE MICROSCOPIC-ADD ON - Abnormal; Notable for the following:    Squamous Epithelial / LPF 6-30 (*)    Bacteria, UA FEW (*)    Crystals TRIPLE PHOSPHATE CRYSTALS (*)    All other components within normal limits  I-STAT CHEM 8, ED - Abnormal; Notable for the following:    BUN 44 (*)    Creatinine, Ser 1.70 (*)    Hemoglobin 10.2 (*)    HCT 30.0 (*)    All other components within normal limits  URINE CULTURE    Imaging Review No results found. I have personally reviewed and evaluated these images and lab results as part of my medical decision-making.   EKG Interpretation None      MDM   Final diagnoses:  Urinary tract infection associated with catheterization of urinary tract, initial encounter  Hematuria  Renal insufficiency  Dehydration    Patient sent in for hematuria and decreased oral intake. Seen by PCP yesterday. Treated with Rocephin for presumed urinary tract infection. Creatinine has a baseline of 1.2 and is up to 1.7 today. Urinalysis does show infection. Foley has been taken out and replaced. Irrigated after hematuria. With worsening renal function will likely admit to hospital.    Benjiman Core, MD 11/03/15 1316

## 2015-11-03 NOTE — ED Notes (Signed)
Attempt to call report.

## 2015-11-04 ENCOUNTER — Observation Stay (HOSPITAL_COMMUNITY): Payer: Medicare Other

## 2015-11-04 DIAGNOSIS — N170 Acute kidney failure with tubular necrosis: Secondary | ICD-10-CM

## 2015-11-04 DIAGNOSIS — Z9289 Personal history of other medical treatment: Secondary | ICD-10-CM | POA: Diagnosis not present

## 2015-11-04 DIAGNOSIS — D509 Iron deficiency anemia, unspecified: Secondary | ICD-10-CM | POA: Diagnosis not present

## 2015-11-04 LAB — URINE CULTURE: Culture: NO GROWTH

## 2015-11-04 LAB — CBC
HEMATOCRIT: 24.2 % — AB (ref 39.0–52.0)
HEMOGLOBIN: 7.6 g/dL — AB (ref 13.0–17.0)
MCH: 29.8 pg (ref 26.0–34.0)
MCHC: 31.4 g/dL (ref 30.0–36.0)
MCV: 94.9 fL (ref 78.0–100.0)
Platelets: 180 10*3/uL (ref 150–400)
RBC: 2.55 MIL/uL — AB (ref 4.22–5.81)
RDW: 14.7 % (ref 11.5–15.5)
WBC: 15.5 10*3/uL — AB (ref 4.0–10.5)

## 2015-11-04 LAB — BASIC METABOLIC PANEL
ANION GAP: 9 (ref 5–15)
BUN: 40 mg/dL — ABNORMAL HIGH (ref 6–20)
CHLORIDE: 107 mmol/L (ref 101–111)
CO2: 21 mmol/L — AB (ref 22–32)
Calcium: 8.7 mg/dL — ABNORMAL LOW (ref 8.9–10.3)
Creatinine, Ser: 1.45 mg/dL — ABNORMAL HIGH (ref 0.61–1.24)
GFR calc non Af Amer: 39 mL/min — ABNORMAL LOW (ref >60)
GFR, EST AFRICAN AMERICAN: 45 mL/min — AB (ref >60)
Glucose, Bld: 77 mg/dL (ref 65–99)
POTASSIUM: 4.5 mmol/L (ref 3.5–5.1)
SODIUM: 137 mmol/L (ref 135–145)

## 2015-11-04 LAB — PREPARE RBC (CROSSMATCH)

## 2015-11-04 MED ORDER — POLYETHYLENE GLYCOL 3350 17 G PO PACK
17.0000 g | PACK | Freq: Every day | ORAL | Status: DC
  Administered 2015-11-04 – 2015-11-05 (×2): 17 g via ORAL
  Filled 2015-11-04 (×2): qty 1

## 2015-11-04 MED ORDER — SODIUM CHLORIDE 0.9 % IV SOLN
Freq: Once | INTRAVENOUS | Status: AC
  Administered 2015-11-04: 15:00:00 via INTRAVENOUS

## 2015-11-04 MED ORDER — MAGNESIUM HYDROXIDE 400 MG/5ML PO SUSP
30.0000 mL | Freq: Every day | ORAL | Status: DC | PRN
  Administered 2015-11-04 – 2015-11-05 (×2): 30 mL via ORAL
  Filled 2015-11-04 (×3): qty 30

## 2015-11-04 NOTE — Progress Notes (Signed)
[facility] PROGRESS NOTE  [name] Bennett:[id number] DOB: Jul 25, 1918 DOA: 11/03/2015 PCP: Margit Hanks, MD  Length of stay:    Assessment/Plan: Active Problems:   Essential hypertension, benign   Hematuria   UTI (lower urinary tract infection)   Anemia, iron deficiency   Prostatic hypertrophy, benign, with obstruction   Protein-calorie malnutrition, severe (HCC)   Chronic indwelling Foley catheter   Acute renal failure (ARF) (HCC)   HPI  [name] Bennett is a 82 y.o. male, With past medical history of hypertension, urinary retention, chronic indwelling Foley catheter, sent from a facility for hematuria, patient with known history of chronic Foley catheter secondary to urinary retention, hematuria noticed yesterday, received IV Rocephin at a facility yesterday, patient with most confusion today, so he was sent by facility, NAD patient with gross hematuria initially, Foley catheter was changed, no evidence of urinary retention or obstruction after new Foley catheter inserted, with a significant for hematuria and UTI, started on IV Rocephin after urine cultures were sent, blood work significant for acute renal failure with a creatinine of 1.7, I was called to admit for IV hydration and UTI treatment    Assessment and plan Hematuria-history of bladder outlet obstruction, BPH, hemorrhagic cystitis, Primary urologist Dr. Alfredo Martinez. - Patient with chronic indwelling Foley catheter, unclear if hematuria is related to manipulation of his Foley/UTI, but currently Foley catheter has been changed in ED, significant improvement, currently has light pink urine. Continue IV fluids Obtain CT abdomen pelvis to further evaluate anatomy of the bladder/kidney/prostrate   Acute blood loss anemia-hemoglobin around 10 at baseline Now 7.6 will transfuse 1 unit of packed red blood cells  UTI - Continue with IV Rocephin, follow on urine cultures based on previous urine  microbiology - Leukocytosis most likely related to UTI, patient is afebrile, no tachycardia, no evidence of sepsis.  Acute renal failure-baseline creatinine 1.2 - Most likely prerenal azotemia secondary to UTI, continue with IV fluids, monitor closely, hold losartan  Hypertension - Continue with Norvasc, hold losartan  Iron deficiency anemia - Continue with by mouth iron  Urinary retention/chronic indwelling Foley catheter - Foley catheter was changed in ED  Protein calorie malnutrition - Will start on ensure  DVT prophylaxsis  SCDs   Code Status:      Code Status Orders        Start     Ordered   11/03/15 1615  Full code   Continuous     11/03/15 1614     Family Communication: Discussed in detail with the patient, all imaging results, lab results explained to the patient   Disposition Plan:  Anticipate discharge on Monday if hemoglobin stable     Consultants:   none  Procedures:  none   Antibiotics: Anti-infectives    Start     Dose/Rate Route Frequency Ordered Stop   11/03/15 1345  cefTRIAXone (ROCEPHIN) 1 g in dextrose 5 % 50 mL IVPB     1 g 100 mL/hr over 30 Minutes Intravenous Every 24 hours 11/03/15 1332           HPI/Subjective: Laying in bed comfortably, very alert and conversive,denies any pelvic pain,   Objective: Filed Vitals:   11/03/15 1415 11/03/15 1612 11/03/15 2120 11/04/15 0450  BP: 131/116 123/54 119/62 124/60  Pulse: 93 91 84 82  Temp:  98.4 F (36.9 C) 98.1 F (36.7 C) 98 F (36.7 C)  TempSrc:  Oral Oral Oral  Resp: 18 18 17  17  Height:    (1.88 m)   Weight:  64.6 kg (142 lb 6.7 oz) 64.6 kg (142 lb 6.7 oz)   SpO2: 95% 92% 94% 93%    Intake/Output Summary (Last 24 hours) at 11/04/15 1610 Last data filed at 11/04/15 9604  Gross per 24 hour  Intake    360 ml  Output   1276 ml  Net   -916 ml    Exam:  General: No acute respiratory distress Lungs: Clear to auscultation bilaterally without wheezes or  crackles Cardiovascular: Regular rate and rhythm without murmur gallop or rub normal S1 and S2 Abdomen: Nontender, nondistended, soft, bowel sounds positive, no rebound, no ascites, no appreciable mass Extremities: No significant cyanosis, clubbing, or edema bilateral lower extremities     Data Review   Micro Results No results found for this or any previous visit (from the past 240 hour(s)).  Radiology Reports No results found.   CBC  Recent Labs Lab 11/03/15 1054 11/03/15 1106 11/04/15 0438  WBC 22.8*  --  15.5*  HGB 9.0* 10.2* 7.6*  HCT 27.4* 30.0* 24.2*  PLT 166  --  180  MCV 95.8  --  94.9  MCH 31.5  --  29.8  MCHC 32.8  --  31.4  RDW 14.7  --  14.7  LYMPHSABS 0.7  --   --   MONOABS 1.4*  --   --   EOSABS 0.0  --   --   BASOSABS 0.0  --   --     Chemistries   Recent Labs Lab 11/03/15 1106 11/04/15 0438  NA 136 137  K 5.0 4.5  CL 103 107  CO2  --  21*  GLUCOSE 72 77  BUN 44* 40*  CREATININE 1.70* 1.45*  CALCIUM  --  8.7*   ------------------------------------------------------------------------------------------------------------------ estimated creatinine clearance is 27.2 mL/min (by C-G formula based on Cr of 1.45). ------------------------------------------------------------------------------------------------------------------ No results for input(s): HGBA1C in the last 72 hours. ------------------------------------------------------------------------------------------------------------------ No results for input(s): CHOL, HDL, LDLCALC, TRIG, CHOLHDL, LDLDIRECT in the last 72 hours. ------------------------------------------------------------------------------------------------------------------ No results for input(s): TSH, T4TOTAL, T3FREE, THYROIDAB in the last 72 hours.  Invalid input(s): FREET3 ------------------------------------------------------------------------------------------------------------------ No results for input(s):  VITAMINB12, FOLATE, FERRITIN, TIBC, IRON, RETICCTPCT in the last 72 hours.  Coagulation profile No results for input(s): INR, PROTIME in the last 168 hours.  No results for input(s): DDIMER in the last 72 hours.  Cardiac Enzymes No results for input(s): CKMB, TROPONINI, MYOGLOBIN in the last 168 hours.  Invalid input(s): CK ------------------------------------------------------------------------------------------------------------------ Invalid input(s): POCBNP   CBG: No results for input(s): GLUCAP in the last 168 hours.     Studies: No results found.    No results found for: HGBA1C Lab Results  Component Value Date   LDLCALC 75 02/04/2014   CREATININE 1.45* 11/04/2015       Scheduled Meds: . amLODipine  5 mg Oral Daily  . cefTRIAXone (ROCEPHIN)  IV  1 g Intravenous Q24H  . feeding supplement (ENSURE ENLIVE)  237 mL Oral BID BM  . feeding supplement (PRO-STAT SUGAR FREE 64)  30 mL Oral BID  . ferrous sulfate  325 mg Oral TID WC  . cyanocobalamin  1,000 mcg Oral Daily  . vitamin C  500 mg Oral BID   Continuous Infusions: . sodium chloride 1 mL (11/04/15 0444)    Active Problems:   Essential hypertension, benign   Hematuria   UTI (lower urinary tract infection)   Anemia, iron deficiency   Prostatic  hypertrophy, benign, with obstruction   Protein-calorie malnutrition, severe (HCC)   Chronic indwelling Foley catheter   Acute renal failure (ARF) (HCC)    Time spent: 45 minutes   Burgess Memorial HospitalBROL,Jarquavious Fentress  [facility] Pager [phone]. If 7PM-7AM, please contact night-coverage at [email or web address], password [email or web address] 11/04/2015, 9:03 AM

## 2015-11-05 DIAGNOSIS — Z87891 Personal history of nicotine dependence: Secondary | ICD-10-CM | POA: Diagnosis not present

## 2015-11-05 DIAGNOSIS — I714 Abdominal aortic aneurysm, without rupture, unspecified: Secondary | ICD-10-CM | POA: Diagnosis present

## 2015-11-05 DIAGNOSIS — R310 Gross hematuria: Secondary | ICD-10-CM | POA: Diagnosis present

## 2015-11-05 DIAGNOSIS — N401 Enlarged prostate with lower urinary tract symptoms: Secondary | ICD-10-CM | POA: Diagnosis present

## 2015-11-05 DIAGNOSIS — N390 Urinary tract infection, site not specified: Secondary | ICD-10-CM | POA: Diagnosis not present

## 2015-11-05 DIAGNOSIS — R319 Hematuria, unspecified: Secondary | ICD-10-CM

## 2015-11-05 DIAGNOSIS — Z681 Body mass index (BMI) 19 or less, adult: Secondary | ICD-10-CM | POA: Diagnosis not present

## 2015-11-05 DIAGNOSIS — E43 Unspecified severe protein-calorie malnutrition: Secondary | ICD-10-CM | POA: Diagnosis present

## 2015-11-05 DIAGNOSIS — D62 Acute posthemorrhagic anemia: Secondary | ICD-10-CM | POA: Diagnosis present

## 2015-11-05 DIAGNOSIS — D509 Iron deficiency anemia, unspecified: Secondary | ICD-10-CM | POA: Diagnosis not present

## 2015-11-05 DIAGNOSIS — Z79899 Other long term (current) drug therapy: Secondary | ICD-10-CM | POA: Diagnosis not present

## 2015-11-05 DIAGNOSIS — Z9689 Presence of other specified functional implants: Secondary | ICD-10-CM | POA: Diagnosis present

## 2015-11-05 DIAGNOSIS — N179 Acute kidney failure, unspecified: Secondary | ICD-10-CM | POA: Diagnosis not present

## 2015-11-05 DIAGNOSIS — E860 Dehydration: Secondary | ICD-10-CM | POA: Diagnosis present

## 2015-11-05 DIAGNOSIS — Z96642 Presence of left artificial hip joint: Secondary | ICD-10-CM | POA: Diagnosis present

## 2015-11-05 DIAGNOSIS — I10 Essential (primary) hypertension: Secondary | ICD-10-CM | POA: Diagnosis present

## 2015-11-05 LAB — CBC
HCT: 28.4 % — ABNORMAL LOW (ref 39.0–52.0)
Hemoglobin: 9.2 g/dL — ABNORMAL LOW (ref 13.0–17.0)
MCH: 30.1 pg (ref 26.0–34.0)
MCHC: 32.4 g/dL (ref 30.0–36.0)
MCV: 92.8 fL (ref 78.0–100.0)
PLATELETS: ADEQUATE 10*3/uL (ref 150–400)
RBC: 3.06 MIL/uL — ABNORMAL LOW (ref 4.22–5.81)
RDW: 15 % (ref 11.5–15.5)
WBC: 15 10*3/uL — AB (ref 4.0–10.5)

## 2015-11-05 LAB — TYPE AND SCREEN
ABO/RH(D): O POS
Antibody Screen: NEGATIVE
Unit division: 0

## 2015-11-05 LAB — COMPREHENSIVE METABOLIC PANEL
ALT: 17 U/L (ref 17–63)
ANION GAP: 9 (ref 5–15)
AST: 32 U/L (ref 15–41)
Albumin: 2.9 g/dL — ABNORMAL LOW (ref 3.5–5.0)
Alkaline Phosphatase: 59 U/L (ref 38–126)
BUN: 24 mg/dL — ABNORMAL HIGH (ref 6–20)
CHLORIDE: 108 mmol/L (ref 101–111)
CO2: 20 mmol/L — ABNORMAL LOW (ref 22–32)
Calcium: 9.2 mg/dL (ref 8.9–10.3)
Creatinine, Ser: 1.22 mg/dL (ref 0.61–1.24)
GFR calc non Af Amer: 48 mL/min — ABNORMAL LOW (ref >60)
GFR, EST AFRICAN AMERICAN: 56 mL/min — AB (ref >60)
Glucose, Bld: 78 mg/dL (ref 65–99)
POTASSIUM: 4.5 mmol/L (ref 3.5–5.1)
Sodium: 137 mmol/L (ref 135–145)
TOTAL PROTEIN: 6.4 g/dL — AB (ref 6.5–8.1)
Total Bilirubin: 1.2 mg/dL (ref 0.3–1.2)

## 2015-11-05 MED ORDER — FERROUS SULFATE 325 (65 FE) MG PO TABS: 325.0000 mg | ORAL_TABLET | Freq: Every day | ORAL | Status: DC

## 2015-11-05 MED ORDER — CEFUROXIME AXETIL 500 MG PO TABS: 500.0000 mg | ORAL_TABLET | Freq: Two times a day (BID) | ORAL | Status: AC

## 2015-11-05 MED ORDER — ALPRAZOLAM 0.25 MG PO TABS
0.2500 mg | ORAL_TABLET | Freq: Two times a day (BID) | ORAL | Status: DC
  Administered 2015-11-05: 0.25 mg via ORAL
  Filled 2015-11-05: qty 1

## 2015-11-05 NOTE — Progress Notes (Signed)
[facility], reported to nurse Alcario DroughtErica.

## 2015-11-05 NOTE — [place] FL2 (Signed)
[place] MEDICAID FL2 LEVEL OF CARE SCREENING TOOL     IDENTIFICATION  Patient Name: [name] Bennett Birthdate: 1917/12/14 Sex: male Admission Date (Current Location): 11/03/2015  [facility] and [place] Number:  Producer, television/film/video and Address:  The . [facility], [address] [place], [place] [address]      Provider Number: [id number]  Attending Physician Name and Address:  Christiane Ha, MD  Relative Name and Phone Number:       Current Level of Care: Hospital Recommended Level of Care: Nursing Facility Prior Approval Number:    Date Approved/Denied:   PASRR Number:    Discharge Plan: SNF    Current Diagnoses: Patient Active Problem List   Diagnosis Date Noted  . AAA (abdominal aortic aneurysm, ruptured) (HCC) 11/05/2015  . Acute renal failure (ARF) (HCC) 11/03/2015  . CKD (chronic kidney disease) stage 2, GFR 60-89 ml/min 08/28/2014  . Chronic indwelling Foley catheter 07/12/2013  . H/O total hip arthroplasty   . Protein-calorie malnutrition, severe (HCC) 04/30/2013  . Weakness generalized 04/28/2013  . Left leg pain 04/28/2013  . Hematuria 09/15/2011  . UTI (lower urinary tract infection) 09/15/2011  . Anemia, iron deficiency 09/15/2011  . Constipation - functional 09/15/2011  . Prostatic hypertrophy, benign, with obstruction 09/15/2011  . WEIGHT LOSS 07/27/2009  . HIP FRACTURE, RIGHT 01/22/2008  . Essential hypertension, benign 10/03/2007  . DEGENERATIVE JOINT DISEASE, KNEES, BILATERAL 10/03/2007  . HERNIORRHAPHY, HX OF 10/03/2007    Orientation RESPIRATION BLADDER Height & Weight     Self, Time, Situation, Place  Normal Indwelling catheter Weight: 142 lb 6.7 oz (64.6 kg) Height:   (188 cm)  BEHAVIORAL SYMPTOMS/MOOD NEUROLOGICAL BOWEL NUTRITION STATUS    Convulsions/Seizures    (low sodium/heart healthy)  AMBULATORY STATUS COMMUNICATION OF NEEDS Skin   Limited Assist Verbally Normal                       Personal  Care Assistance Level of Assistance  Bathing, Dressing Bathing Assistance: Limited assistance   Dressing Assistance: Limited assistance     Functional Limitations Info  Sight, Hearing, Speech Sight Info: Adequate Hearing Info: Adequate Speech Info: Adequate    SPECIAL CARE FACTORS FREQUENCY                       Contractures Contractures Info: Not present    Additional Factors Info  Allergies Code Status Info:  (full code) Allergies Info:  (NKDA)           Current Medications (11/05/2015):  This is the current hospital active medication list Current Facility-Administered Medications  Medication Dose Route Frequency Provider Last Rate Last Dose  . 0.9 %  sodium chloride infusion   Intravenous Continuous Richarda Overlie, MD 100 mL/hr at 11/05/15 0559 1 mL at 11/05/15 0559  . albuterol (PROVENTIL) (2.5 MG/3ML) 0.083% nebulizer solution 2.5 mg  2.5 mg Nebulization Q2H PRN Starleen Arms, MD      . ALPRAZolam Prudy Feeler) tablet 0.25 mg  0.25 mg Oral BID Christiane Ha, MD   0.25 mg at 11/05/15 0809  . amLODipine (NORVASC) tablet 5 mg  5 mg Oral Daily Starleen Arms, MD   5 mg at 11/05/15 0929  . cefTRIAXone (ROCEPHIN) 1 g in dextrose 5 % 50 mL IVPB  1 g Intravenous Q24H Starleen Arms, MD 100 mL/hr at 11/04/15 1326 1 g at 11/04/15 1326  . feeding supplement (ENSURE ENLIVE) (  ENSURE ENLIVE) liquid 237 mL  237 mL Oral BID BM Starleen Armsawood S Elgergawy, MD   237 mL at 11/04/15 0934  . feeding supplement (PRO-STAT SUGAR FREE 64) liquid 30 mL  30 mL Oral BID Starleen Armsawood S Elgergawy, MD   30 mL at 11/05/15 0929  . ferrous sulfate tablet 325 mg  325 mg Oral TID WC Starleen Armsawood S Elgergawy, MD   325 mg at 11/05/15 0809  . magnesium hydroxide (MILK OF MAGNESIA) suspension 30 mL  30 mL Oral Daily PRN Richarda OverlieNayana Abrol, MD   30 mL at 11/05/15 0928  . polyethylene glycol (MIRALAX / GLYCOLAX) packet 17 g  17 g Oral Daily Richarda OverlieNayana Abrol, MD   17 g at 11/05/15 0928  . vitamin B-12 (CYANOCOBALAMIN) tablet  1,000 mcg  1,000 mcg Oral Daily Starleen Armsawood S Elgergawy, MD   1,000 mcg at 11/05/15 0929  . vitamin C (ASCORBIC ACID) tablet 500 mg  500 mg Oral BID Starleen Armsawood S Elgergawy, MD   500 mg at 11/05/15 [id number]     Discharge Medications: Please see discharge summary for a list of discharge medications.  Relevant Imaging Results:  Relevant Lab Results:   Additional Information    Angelos Wasco M, LCSW

## 2015-11-05 NOTE — Progress Notes (Signed)
CSW waiting on confirmation from [facility] that they can accept pt today.  FL2 prepared for co-sign and d/c information sent to facility via Epic hub.  CSW will continue to follow and facilitate d/c as appropriate.  Pollyann SavoyJody Audre Cenc[place], [place] Coverage [phone]

## 2015-11-05 NOTE — Discharge Summary (Signed)
Physician Discharge Summary  Zenaida DeedRussell L Gater ZOX:[id number] DOB: 16-Sep-1917 DOA: 11/03/2015  PCP: Margit HanksALEXANDER, ANNE D, MD  Admit date: 11/03/2015 Discharge date: 11/05/2015  Time spent: greater than 30 minutes  Recommendations for Outpatient Follow-up:  1. Monitor BMET 2. Monitor CBC 3. Monitor blood pressure   Discharge Diagnoses:  Active Problems:   Essential hypertension, benign   Hematuria   UTI (lower urinary tract infection)   Anemia, iron deficiency   Prostatic hypertrophy, benign, with obstruction   Protein-calorie malnutrition, severe (HCC)   Chronic indwelling Foley catheter   Acute renal failure (ARF) (HCC)   AAA (abdominal aortic aneurysm, ruptured) [facility](HCC)   Discharge Condition: stable  Diet recommendation: heart healthy  Filed Weights   11/03/15 1612 11/03/15 2120  Weight: 64.6 kg (142 lb 6.7 oz) 64.6 kg (142 lb 6.7 oz)    History of present illness:   [name] Bennett is a 82 y.o. male, With past medical history of hypertension, urinary retention, chronic indwelling Foley catheter, sent from a facility for hematuria, patient with known history of chronic Foley catheter secondary to urinary retention, hematuria noticed yesterday, received IV Rocephin at a facility yesterday, patient with most confusion today, so he was sent by facility, NAD patient with gross hematuria initially, Foley catheter was changed, no evidence of urinary retention or obstruction after new Foley catheter inserted, with a significant for hematuria and UTI, started on IV Rocephin after urine cultures were sent, blood work significant for acute renal failure with a creatinine of 1.7  Hospital Course:  Hematuria-history of bladder outlet obstruction, BPH, hemorrhagic cystitis, Primary urologist Dr. Alfredo MartinezScott MacDiarmid. - Patient with chronic indwelling Foley catheter, unclear if hematuria is related to manipulation of his Foley/UTI, but currently Foley catheter has been changed in ED, significant  improvement, by discharge, gross hematuria resolved  CT abdomen pelvis without acute abnormality. See below  Acute blood loss anemia atop chronic anemia-hemoglobin around 10 at baseline dropped 7.6. Transfused 1 unit of packed red blood cells. At discharge, hemoglobin is 9.2.  UTI Received Rocephin while here. Urine culture is negative, but unreliable, as patient had been on antibiotic prior to admission. Will continue Ceftin to complete a seven-day course.  Acute renal failure-baseline creatinine 1.2 Losartan held. With hydration, creatinine decreased back to baseline. Will discharge off losartan. Will need monitoring of blood pressure and basic metabolic panel. Might need to have antihypertensives adjusted. Currently normotensive  Hypertension - Continue with Norvasc, hold losartan  Iron deficiency anemia - Continue with by mouth iron.  Urinary retention/chronic indwelling Foley catheter - Foley catheter was changed in ED  Protein calorie malnutrition - Will start on ensure  Incidental abdominal aortic aneurysm noted on CAT scan. Not an operative candidate due to age.  Procedures:  none  Consultations:  none  Discharge Exam: Filed Vitals:   11/04/15 2000 11/05/15 0517  BP: 117/50 115/60  Pulse: 61 60  Temp: 98.8 F (37.1 C) 98.4 F (36.9 C)  Resp: 19 19    General: Alert, oriented and talkative. Cardiovascular: Regular rate rhythm without murmurs gallops rubs Respiratory: Clear to auscultation bilaterally without wheezes rhonchi or rales Abdomen soft nontender nondistended External remedies no clubbing cyanosis or edema  Discharge Instructions   Discharge Instructions    Diet - low sodium heart healthy    Complete by:  As directed      Walk with assistance    Complete by:  As directed           Current Discharge Medication List  CONTINUE these medications which have CHANGED   Details  ferrous sulfate 325 (65 FE) MG tablet Take 1 tablet (325 mg  total) by mouth daily with breakfast. For anemia Refills: 3      CONTINUE these medications which have NOT CHANGED   Details  acetaminophen (TYLENOL) 325 MG tablet Take 650 mg by mouth every 6 (six) hours as needed for mild pain or moderate pain.    ALPRAZolam (XANAX) 0.25 MG tablet Take one tablet by mouth twice daily for anixety Qty: 60 tablet, Refills: 5    Amino Acids-Protein Hydrolys (FEEDING SUPPLEMENT, PRO-STAT SUGAR FREE 64,) LIQD Take 30 mLs by mouth 2 (two) times daily.    amLODipine (NORVASC) 5 MG tablet Take 5 mg by mouth daily.    Cholecalciferol (VITAMIN D3) 50000 units TABS Take 1 tablet by mouth every 30 (thirty) days.    Cranberry-Vitamin C-Inulin (UTI-STAT) LIQD Take 30 mLs by mouth daily.    cyanocobalamin 1000 MCG tablet Take 1,000 mcg by mouth daily.     phenazopyridine (URISTAT) 95 MG tablet Take 95 mg by mouth 3 (three) times daily.    polyethylene glycol (MIRALAX / GLYCOLAX) packet Take 17 g by mouth daily. For constipation    vitamin C (ASCORBIC ACID) 500 MG tablet Take 500 mg by mouth 2 (two) times daily.      STOP taking these medications     losartan (COZAAR) 100 MG tablet        No Known Allergies Follow-up Information    Follow up with Margit Hanks, MD In 1 week.   Specialty:  Internal Medicine   Contact information:   [address] [place] [address] [phone]        The results of significant diagnostics from this hospitalization (including imaging, microbiology, ancillary and laboratory) are listed below for reference.    Significant Diagnostic Studies: Ct Abdomen Pelvis Wo Contrast  11/04/2015  CLINICAL DATA:  Pt admitted due to gross hematuria; pt states that this has since resolved; pt denies any abdominal or pelvic pain; hematuria felt to be secondary to UTI EXAM: CT ABDOMEN AND PELVIS WITHOUT CONTRAST TECHNIQUE: Multidetector CT imaging of the abdomen and pelvis was performed following the standard protocol without  IV contrast. COMPARISON:  01/28/2013 FINDINGS: Lung bases: Stable emphysematous changes at the lung bases. Mild dependent subsegmental atelectasis. No convincing pneumonia or edema. Heart normal in size. Liver, spleen, gallbladder, pancreas, adrenal glands:  Unremarkable. Kidneys, ureters, bladder: Mild bilateral renal cortical thinning. Bilateral low-density renal masses reflecting cysts stable from the prior study. No hydronephrosis. Ureters normal course and caliber. Bladder is not well evaluated. It is decompressed by a Foley catheter and obscured by artifact from bilateral hip prostheses. Vascular: 5.6 cm infrarenal abdominal aortic aneurysm. 3.9 x 3.4 cm right common iliac artery aneurysm. Abdominal aortic aneurysm has increased from 5.1 cm previously. Common iliac artery aneurysm is similar. Lymph nodes:  No adenopathy. Ascites:  None. Gastrointestinal: Mild generalized increased stool noted in the right colon with mild right colon distention. No evidence of bowel obstruction. No bowel thickening or mesenteric inflammation. Normal appendix visualized. Musculoskeletal: Visualized portions of bilateral hip prostheses appear well seated and aligned. Bones are diffusely demineralized. There are degenerative changes throughout the visualized spine as well as a scoliosis. No osteoblastic or osteolytic lesions. IMPRESSION: 1. No renal or ureteral stones or obstructive uropathy. 2. Bladder not well evaluated. It is decompressed with a Foley catheter and obscured by artifact from bilateral hip prosthesis. 3. No  no acute findings within the abdomen or pelvis. 4. 5.6 infrarenal abdominal aortic aneurysm, increased 5 mm when compared to the prior CT. Right iliac artery and mild, similar to the prior CT. Vascular surgery consultation recommended due to increased risk of rupture for AAA >5.5 cm. This recommendation follows ACR consensus guidelines: White Paper of the ACR Incidental Findings Committee II on Vascular  Findings. J Am Coll Radiol 2013; 10:789-794. Electronically Signed   By: Amie Portland M.D.   On: 11/04/2015 12:32    Microbiology: Recent Results (from the past 240 hour(s))  Urine culture     Status: None   Collection Time: 11/03/15 12:35 PM  Result Value Ref Range Status   Specimen Description URINE, CATHETERIZED  Final   Special Requests NONE  Final   Culture NO GROWTH 1 DAY  Final   Report Status 11/04/2015 FINAL  Final     Labs: Basic Metabolic Panel:  Recent Labs Lab 11/03/15 1106 11/04/15 0438 11/05/15 0714  NA 136 137 137  K 5.0 4.5 4.5  CL 103 107 108  CO2  --  21* 20*  GLUCOSE 72 77 78  BUN 44* 40* 24*  CREATININE 1.70* 1.45* 1.22  CALCIUM  --  8.7* 9.2   Liver Function Tests:  Recent Labs Lab 11/05/15 0714  AST 32  ALT 17  ALKPHOS 59  BILITOT 1.2  PROT 6.4*  ALBUMIN 2.9*   No results for input(s): LIPASE, AMYLASE in the last 168 hours. No results for input(s): AMMONIA in the last 168 hours. CBC:  Recent Labs Lab 11/03/15 1054 11/03/15 1106 11/04/15 0438 11/05/15 0714  WBC 22.8*  --  15.5* 15.0*  NEUTROABS 20.7*  --   --   --   HGB 9.0* 10.2* 7.6* 9.2*  HCT 27.4* 30.0* 24.2* 28.4*  MCV 95.8  --  94.9 92.8  PLT 166  --  180 PLATELET CLUMPS NOTED ON SMEAR, COUNT APPEARS ADEQUATE   Cardiac Enzymes: No results for input(s): CKTOTAL, CKMB, CKMBINDEX, TROPONINI in the last 168 hours. BNP: BNP (last 3 results) No results for input(s): BNP in the last 8760 hours.  ProBNP (last 3 results) No results for input(s): PROBNP in the last 8760 hours.  CBG: No results for input(s): GLUCAP in the last 168 hours.     Signed:  Christiane Ha MD [facility] 11/05/2015, 9:08 AM

## 2015-11-30 ENCOUNTER — Non-Acute Institutional Stay (SKILLED_NURSING_FACILITY): Payer: Medicare Other | Admitting: Internal Medicine

## 2015-11-30 ENCOUNTER — Encounter: Payer: Self-pay | Admitting: Internal Medicine

## 2015-11-30 DIAGNOSIS — D509 Iron deficiency anemia, unspecified: Secondary | ICD-10-CM | POA: Diagnosis not present

## 2015-11-30 DIAGNOSIS — Z9289 Personal history of other medical treatment: Secondary | ICD-10-CM

## 2015-11-30 DIAGNOSIS — N182 Chronic kidney disease, stage 2 (mild): Secondary | ICD-10-CM | POA: Diagnosis not present

## 2015-11-30 DIAGNOSIS — Z978 Presence of other specified devices: Secondary | ICD-10-CM

## 2015-11-30 DIAGNOSIS — Z960 Presence of urogenital implants: Secondary | ICD-10-CM

## 2015-11-30 DIAGNOSIS — E43 Unspecified severe protein-calorie malnutrition: Secondary | ICD-10-CM

## 2015-11-30 DIAGNOSIS — N401 Enlarged prostate with lower urinary tract symptoms: Secondary | ICD-10-CM | POA: Diagnosis not present

## 2015-11-30 DIAGNOSIS — R339 Retention of urine, unspecified: Secondary | ICD-10-CM | POA: Diagnosis not present

## 2015-11-30 DIAGNOSIS — I10 Essential (primary) hypertension: Secondary | ICD-10-CM | POA: Diagnosis not present

## 2015-11-30 DIAGNOSIS — J309 Allergic rhinitis, unspecified: Secondary | ICD-10-CM | POA: Diagnosis not present

## 2015-11-30 DIAGNOSIS — N138 Other obstructive and reflux uropathy: Secondary | ICD-10-CM | POA: Diagnosis not present

## 2015-11-30 NOTE — Progress Notes (Signed)
Patient ID: [name] Bennett, male   DOB: [birth year], 82 y.o.   MRN: [id number]    DATE: 11/30/15  Location:  [facility]    Place of Service: SNF (31)   Extended Emergency Contact Information Primary Emergency Contact: Eldridge Scot [place] of [address] Phone: [phone] Relation: Niece Secondary Emergency Contact: Audie Pinto [place] of [place] Phone: [phone] Relation: Niece  Advanced Directive information  FULL CODE  Chief Complaint  Patient presents with  . Medical Management of Chronic Issues    OPTUM    HPI:  82 yo male long term resident seen today for f/u. He has no c/o. claritin added to regimen yesterday by [facility] provider. No nursing issues. No falls.   HTN - BP stable on losartan, norvasc.  Constipation - stable on miralax  Anemia - iron deficiency; stable on iron supplement. 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 He takes uristat  CKD - Cr stable at 1.35  He takes vitamins, minerals and nutritional supplement  He tries to get as much exercise as he can on a daily basis. He ambulates with standard w/c.     Past Medical History  Diagnosis Date  . Hypertension   . Urinary retention     Past Surgical History  Procedure Laterality Date  . Fracture surgery    . Hip fracture surgery Right 2014  . Hip arthroplasty Left 05/01/2013    Procedure: ARTHROPLASTY BIPOLAR HIP;  Surgeon: Mauri Pole, MD;  Location: [facility];  Service: Orthopedics;  Laterality: Left;    Patient Care Team: Hennie Duos, MD as PCP - General (Internal Medicine)  Social History   Social History  . Marital Status: Widowed    Spouse Name: N/A  . Number of Children: N/A  . Years of Education: N/A   Occupational History  . Not on file.   Social History Main Topics  . Smoking status: Former Smoker    Types: Cigarettes, Pipe, Cigars    Quit date: 11/02/1952  .  Smokeless tobacco: Former Systems developer    Types: Snuff, Chew  . Alcohol Use: Yes     Comment: 11/02/2015 "used alcohol way back in my early years"  . Drug Use: No  . Sexual Activity: No   Other Topics Concern  . Not on file   Social History Narrative     reports that he quit smoking about 63 years ago. His smoking use included Cigarettes, Pipe, and Cigars. He has quit using smokeless tobacco. His smokeless tobacco use included Snuff and Chew. He reports that he drinks alcohol. He reports that he does not use illicit drugs.  Immunization History  Administered Date(s) Administered  . PPD Test 07/17/2013    No Known Allergies  Medications: Patient's Medications  New Prescriptions   No medications on file  Previous Medications   ACETAMINOPHEN (TYLENOL) 325 MG TABLET    Take 650 mg by mouth every 6 (six) hours as needed for mild pain or moderate pain.   ALPRAZOLAM (XANAX) 0.25 MG TABLET    Take one tablet by mouth twice daily for anixety   AMINO ACIDS-PROTEIN HYDROLYS (FEEDING SUPPLEMENT, PRO-STAT SUGAR FREE 64,) LIQD    Take 30 mLs by mouth 2 (two) times daily.   AMLODIPINE (NORVASC) 5 MG TABLET    Take 5 mg by mouth daily.   CHOLECALCIFEROL (VITAMIN D3) 50000 UNITS TABS    Take 1 tablet by mouth every 30 (thirty)  days.   CRANBERRY-VITAMIN C-INULIN (UTI-STAT) LIQD    Take 30 mLs by mouth daily.   CYANOCOBALAMIN 1000 MCG TABLET    Take 1,000 mcg by mouth daily.    FERROUS SULFATE 325 (65 FE) MG TABLET    Take 1 tablet (325 mg total) by mouth daily with breakfast. For anemia   LORATADINE (CLARITIN) 10 MG TABLET    Take 10 mg by mouth daily.   PHENAZOPYRIDINE (URISTAT) 95 MG TABLET    Take 95 mg by mouth 3 (three) times daily.   POLYETHYLENE GLYCOL (MIRALAX / GLYCOLAX) PACKET    Take 17 g by mouth daily. For constipation   VITAMIN C (ASCORBIC ACID) 500 MG TABLET    Take 500 mg by mouth 2 (two) times daily.  Modified Medications   No medications on file  Discontinued Medications   No  medications on file    Review of Systems  Genitourinary: Positive for difficulty urinating.  Musculoskeletal: Positive for arthralgias and gait problem.  Allergic/Immunologic: Positive for environmental allergies.  All other systems reviewed and are negative.   Filed Vitals:   11/30/15 1653  BP: 115/69  Pulse: 70  Temp: 98.2 F (36.8 C)  Weight: 144 lb 3.2 oz (65.409 kg)   Body mass index is 18.51 kg/(m^2).  Physical Exam  Constitutional: He is oriented to person, place, and time. He appears well-developed.  Frail appearing sitting in w/c in NAD  HENT:  Mouth/Throat: No oropharyngeal exudate.  MMM   Eyes: Pupils are equal, round, and reactive to light. No scleral icterus.  Neck: Neck supple. Carotid bruit is not present. No thyromegaly present.  Cardiovascular: Normal rate, regular rhythm and intact distal pulses.  Exam reveals no gallop and no friction rub.   Murmur (1/6 sem) heard. Trace distal LE swelling. No calf TTP  Pulmonary/Chest: Effort normal and breath sounds normal. He has no wheezes. He has no rales. He exhibits no tenderness.  Abdominal: Soft. Bowel sounds are normal. He exhibits no distension, no abdominal bruit, no pulsatile midline mass and no mass. There is no tenderness. There is no rebound and no guarding.  Genitourinary:  Foley cath intact and DTG dark yellow urine. No gross blood or blood clots  Musculoskeletal: He exhibits edema.  Thoracic kyphosis  Lymphadenopathy:    He has no cervical adenopathy.  Neurological: He is alert and oriented to person, place, and time.  Skin: Skin is warm and dry. No rash noted.  Psychiatric: He has a normal mood and affect. His behavior is normal. Thought content normal.     Labs reviewed: Admission on 11/03/2015, Discharged on 11/05/2015  Component Date Value Ref Range Status  . Sodium 11/03/2015 136  135 - 145 mmol/L Final  . Potassium 11/03/2015 5.0  3.5 - 5.1 mmol/L Final  . Chloride 11/03/2015 103  101 - 111  mmol/L Final  . BUN 11/03/2015 44* 6 - 20 mg/dL Final  . Creatinine, Ser 11/03/2015 1.70* 0.61 - 1.24 mg/dL Final  . Glucose, Bld 11/03/2015 72  65 - 99 mg/dL Final  . Calcium, Ion 11/03/2015 1.14  1.13 - 1.30 mmol/L Final  . TCO2 11/03/2015 21  0 - 100 mmol/L Final  . Hemoglobin 11/03/2015 10.2* 13.0 - 17.0 g/dL Final  . HCT 11/03/2015 30.0* 39.0 - 52.0 % Final  . Color, Urine 11/03/2015 RED* YELLOW Final   BIOCHEMICALS MAY BE AFFECTED BY COLOR  . APPearance 11/03/2015 TURBID* CLEAR Final  . Specific Gravity, Urine 11/03/2015 1.013  1.005 - 1.030  Final  . pH 11/03/2015 8.0  5.0 - 8.0 Final  . Glucose, UA 11/03/2015 NEGATIVE  NEGATIVE mg/dL Final  . Hgb urine dipstick 11/03/2015 LARGE* NEGATIVE Final  . Bilirubin Urine 11/03/2015 MODERATE* NEGATIVE Final  . Ketones, ur 11/03/2015 15* NEGATIVE mg/dL Final  . Protein, ur 11/03/2015 >300* NEGATIVE mg/dL Final  . Nitrite 11/03/2015 POSITIVE* NEGATIVE Final  . Leukocytes, UA 11/03/2015 LARGE* NEGATIVE Final  . WBC 11/03/2015 22.8* 4.0 - 10.5 K/uL Final  . RBC 11/03/2015 2.86* 4.22 - 5.81 MIL/uL Final  . Hemoglobin 11/03/2015 9.0* 13.0 - 17.0 g/dL Final  . HCT 11/03/2015 27.4* 39.0 - 52.0 % Final  . MCV 11/03/2015 95.8  78.0 - 100.0 fL Final  . MCH 11/03/2015 31.5  26.0 - 34.0 pg Final  . MCHC 11/03/2015 32.8  30.0 - 36.0 g/dL Final  . RDW 11/03/2015 14.7  11.5 - 15.5 % Final  . Platelets 11/03/2015 166  150 - 400 K/uL Final  . Neutrophils Relative % 11/03/2015 91   Final  . Lymphocytes Relative 11/03/2015 3   Final  . Monocytes Relative 11/03/2015 6   Final  . Eosinophils Relative 11/03/2015 0   Final  . Basophils Relative 11/03/2015 0   Final  . Neutro Abs 11/03/2015 20.7* 1.7 - 7.7 K/uL Final  . Lymphs Abs 11/03/2015 0.7  0.7 - 4.0 K/uL Final  . Monocytes Absolute 11/03/2015 1.4* 0.1 - 1.0 K/uL Final  . Eosinophils Absolute 11/03/2015 0.0  0.0 - 0.7 K/uL Final  . Basophils Absolute 11/03/2015 0.0  0.0 - 0.1 K/uL Final  . WBC  Morphology 11/03/2015 INCREASED BANDS (>20% BANDS)   Final  . Specimen Description 11/03/2015 URINE, CATHETERIZED   Final  . Special Requests 11/03/2015 NONE   Final  . Culture 11/03/2015 NO GROWTH 1 DAY   Final  . Report Status 11/03/2015 11/04/2015 FINAL   Final  . Squamous Epithelial / LPF 11/03/2015 6-30* NONE SEEN Final  . WBC, UA 11/03/2015 TOO NUMEROUS TO COUNT  0 - 5 WBC/hpf Final  . RBC / HPF 11/03/2015 TOO NUMEROUS TO COUNT  0 - 5 RBC/hpf Final  . Bacteria, UA 11/03/2015 FEW* NONE SEEN Final  . Crystals 11/03/2015 TRIPLE PHOSPHATE CRYSTALS* NEGATIVE Final  . Sodium 11/04/2015 137  135 - 145 mmol/L Final  . Potassium 11/04/2015 4.5  3.5 - 5.1 mmol/L Final  . Chloride 11/04/2015 107  101 - 111 mmol/L Final  . CO2 11/04/2015 21* 22 - 32 mmol/L Final  . Glucose, Bld 11/04/2015 77  65 - 99 mg/dL Final  . BUN 11/04/2015 40* 6 - 20 mg/dL Final  . Creatinine, Ser 11/04/2015 1.45* 0.61 - 1.24 mg/dL Final  . Calcium 11/04/2015 8.7* 8.9 - 10.3 mg/dL Final  . GFR calc non Af Amer 11/04/2015 39* >60 mL/min Final  . GFR calc Af Amer 11/04/2015 45* >60 mL/min Final   Comment: (NOTE) The eGFR has been calculated using the CKD EPI equation. This calculation has not been validated in all clinical situations. eGFR's persistently <60 mL/min signify possible Chronic Kidney Disease.   . Anion gap 11/04/2015 9  5 - 15 Final  . WBC 11/04/2015 15.5* 4.0 - 10.5 K/uL Final  . RBC 11/04/2015 2.55* 4.22 - 5.81 MIL/uL Final  . Hemoglobin 11/04/2015 7.6* 13.0 - 17.0 g/dL Final   Comment: DELTA CHECK NOTED REPEATED TO VERIFY   . HCT 11/04/2015 24.2* 39.0 - 52.0 % Final  . MCV 11/04/2015 94.9  78.0 - 100.0 fL Final  .  MCH 11/04/2015 29.8  26.0 - 34.0 pg Final  . MCHC 11/04/2015 31.4  30.0 - 36.0 g/dL Final  . RDW 11/04/2015 14.7  11.5 - 15.5 % Final  . Platelets 11/04/2015 180  150 - 400 K/uL Final  . ABO/RH(D) 11/04/2015 O POS   Final  . Antibody Screen 11/04/2015 NEG   Final  . Sample Expiration  11/04/2015 11/07/2015   Final  . Unit Number 11/04/2015 [id number]   Final  . Blood Component Type 11/04/2015 RBC LR PHER2   Final  . Unit division 11/04/2015 00   Final  . Status of Unit 11/04/2015 ISSUED,FINAL   Final  . Transfusion Status 11/04/2015 OK TO TRANSFUSE   Final  . Crossmatch Result 11/04/2015 Compatible   Final  . Order Confirmation 11/04/2015 ORDER PROCESSED BY BLOOD BANK   Final  . WBC 11/05/2015 15.0* 4.0 - 10.5 K/uL Final   WHITE COUNT CONFIRMED ON SMEAR  . RBC 11/05/2015 3.06* 4.22 - 5.81 MIL/uL Final  . Hemoglobin 11/05/2015 9.2* 13.0 - 17.0 g/dL Final  . HCT 11/05/2015 28.4* 39.0 - 52.0 % Final  . MCV 11/05/2015 92.8  78.0 - 100.0 fL Final  . MCH 11/05/2015 30.1  26.0 - 34.0 pg Final  . MCHC 11/05/2015 32.4  30.0 - 36.0 g/dL Final  . RDW 11/05/2015 15.0  11.5 - 15.5 % Final  . Platelets 11/05/2015 PLATELET CLUMPS NOTED ON SMEAR, COUNT APPEARS ADEQUATE  150 - 400 K/uL Final  . Sodium 11/05/2015 137  135 - 145 mmol/L Final  . Potassium 11/05/2015 4.5  3.5 - 5.1 mmol/L Final  . Chloride 11/05/2015 108  101 - 111 mmol/L Final  . CO2 11/05/2015 20* 22 - 32 mmol/L Final  . Glucose, Bld 11/05/2015 78  65 - 99 mg/dL Final  . BUN 11/05/2015 24* 6 - 20 mg/dL Final  . Creatinine, Ser 11/05/2015 1.22  0.61 - 1.24 mg/dL Final  . Calcium 11/05/2015 9.2  8.9 - 10.3 mg/dL Final  . Total Protein 11/05/2015 6.4* 6.5 - 8.1 g/dL Final  . Albumin 11/05/2015 2.9* 3.5 - 5.0 g/dL Final  . AST 11/05/2015 32  15 - 41 U/L Final  . ALT 11/05/2015 17  17 - 63 U/L Final  . Alkaline Phosphatase 11/05/2015 59  38 - 126 U/L Final  . Total Bilirubin 11/05/2015 1.2  0.3 - 1.2 mg/dL Final  . GFR calc non Af Amer 11/05/2015 48* >60 mL/min Final  . GFR calc Af Amer 11/05/2015 56* >60 mL/min Final   Comment: (NOTE) The eGFR has been calculated using the CKD EPI equation. This calculation has not been validated in all clinical situations. eGFR's persistently <60 mL/min signify possible Chronic  Kidney Disease.   . Anion gap 11/05/2015 9  5 - 15 Final  Nursing Home on 09/25/2015  Component Date Value Ref Range Status  . Hemoglobin 08/21/2015 10.6* 13.5 - 17.5 g/dL Final  . HCT 08/21/2015 32* 41 - 53 % Final  . Platelets 08/21/2015 267  150 - 399 K/L Final  . WBC 08/21/2015 5.7   Final  . BUN 08/21/2015 34* 4 - 21 mg/dL Final  . Creatinine 08/21/2015 1.2  0.6 - 1.3 mg/dL Final  . Potassium 08/21/2015 4.5  3.4 - 5.3 mmol/L Final  . Sodium 08/21/2015 134* 137 - 147 mmol/L Final    Ct Abdomen Pelvis Wo Contrast  11/04/2015  CLINICAL DATA:  Pt admitted due to gross hematuria; pt states that this has since resolved; pt denies any abdominal  or pelvic pain; hematuria felt to be secondary to UTI EXAM: CT ABDOMEN AND PELVIS WITHOUT CONTRAST TECHNIQUE: Multidetector CT imaging of the abdomen and pelvis was performed following the standard protocol without IV contrast. COMPARISON:  01/28/2013 FINDINGS: Lung bases: Stable emphysematous changes at the lung bases. Mild dependent subsegmental atelectasis. No convincing pneumonia or edema. Heart normal in size. Liver, spleen, gallbladder, pancreas, adrenal glands:  Unremarkable. Kidneys, ureters, bladder: Mild bilateral renal cortical thinning. Bilateral low-density renal masses reflecting cysts stable from the prior study. No hydronephrosis. Ureters normal course and caliber. Bladder is not well evaluated. It is decompressed by a Foley catheter and obscured by artifact from bilateral hip prostheses. Vascular: 5.6 cm infrarenal abdominal aortic aneurysm. 3.9 x 3.4 cm right common iliac artery aneurysm. Abdominal aortic aneurysm has increased from 5.1 cm previously. Common iliac artery aneurysm is similar. Lymph nodes:  No adenopathy. Ascites:  None. Gastrointestinal: Mild generalized increased stool noted in the right colon with mild right colon distention. No evidence of bowel obstruction. No bowel thickening or mesenteric inflammation. Normal appendix  visualized. Musculoskeletal: Visualized portions of bilateral hip prostheses appear well seated and aligned. Bones are diffusely demineralized. There are degenerative changes throughout the visualized spine as well as a scoliosis. No osteoblastic or osteolytic lesions. IMPRESSION: 1. No renal or ureteral stones or obstructive uropathy. 2. Bladder not well evaluated. It is decompressed with a Foley catheter and obscured by artifact from bilateral hip prosthesis. 3. No no acute findings within the abdomen or pelvis. 4. 5.6 infrarenal abdominal aortic aneurysm, increased 5 mm when compared to the prior CT. Right iliac artery and mild, similar to the prior CT. Vascular surgery consultation recommended due to increased risk of rupture for AAA >5.5 cm. This recommendation follows ACR consensus guidelines: White Paper of the ACR Incidental Findings Committee II on Vascular Findings. J Am Coll Radiol 2013; 10:789-794. Electronically Signed   By: Lajean Manes M.D.   On: 11/04/2015 12:32     Assessment/Plan   ICD-9-CM ICD-10-CM   1. Urinary retention 788.20 R33.9   2. Chronic indwelling Foley catheter V45.89 Z92.89   3. Prostatic hypertrophy, benign, with obstruction 600.21 N40.1    599.69 N13.8   4. CKD (chronic kidney disease) stage 2, GFR 60-89 ml/min 585.2 N18.2   5. Essential hypertension, benign 401.1 I10   6. Protein-calorie malnutrition, severe ([place]) 262 E43   7. Iron deficiency anemia 280.9 D50.9   8.      Allergic rhinitis unspecified type  Check BMP   Cont current meds as ordered  PT/OT/ST as indicated  F/u with specialists as indicated  [place] provider to follow  Will follow  Quintarius Ferns S. Perlie Gold  [facility] [address] [place], [place] [address] [phone] Cell (Monday-Friday 8 AM - 5 PM) [phone] After 5 PM and follow prompts

## 2015-12-02 LAB — BASIC METABOLIC PANEL
BUN: 32 mg/dL — AB (ref 4–21)
Creatinine: 1.2 mg/dL (ref 0.6–1.3)
Glucose: 70 mg/dL
POTASSIUM: 5.1 mmol/L (ref 3.4–5.3)
Sodium: 136 mmol/L — AB (ref 137–147)

## 2016-01-01 ENCOUNTER — Encounter: Payer: Self-pay | Admitting: Internal Medicine

## 2016-01-01 ENCOUNTER — Non-Acute Institutional Stay (SKILLED_NURSING_FACILITY): Payer: Medicare Other | Admitting: Internal Medicine

## 2016-01-01 DIAGNOSIS — I10 Essential (primary) hypertension: Secondary | ICD-10-CM | POA: Diagnosis not present

## 2016-01-01 DIAGNOSIS — Z9289 Personal history of other medical treatment: Secondary | ICD-10-CM

## 2016-01-01 DIAGNOSIS — N182 Chronic kidney disease, stage 2 (mild): Secondary | ICD-10-CM | POA: Diagnosis not present

## 2016-01-01 DIAGNOSIS — D509 Iron deficiency anemia, unspecified: Secondary | ICD-10-CM | POA: Diagnosis not present

## 2016-01-01 DIAGNOSIS — E43 Unspecified severe protein-calorie malnutrition: Secondary | ICD-10-CM

## 2016-01-01 DIAGNOSIS — N138 Other obstructive and reflux uropathy: Secondary | ICD-10-CM | POA: Diagnosis not present

## 2016-01-01 DIAGNOSIS — Z960 Presence of urogenital implants: Secondary | ICD-10-CM

## 2016-01-01 DIAGNOSIS — N401 Enlarged prostate with lower urinary tract symptoms: Secondary | ICD-10-CM

## 2016-01-01 DIAGNOSIS — Z978 Presence of other specified devices: Secondary | ICD-10-CM

## 2016-01-01 NOTE — Progress Notes (Signed)
DATE: 01/01/16  Location:  [facility]  Nursing Home Room Number: 564 B Place of Service: SNF (31)   Extended Emergency Contact Information Primary Emergency Contact: Eldridge Scot [place] Phone: [phone] Relation: Niece Secondary Emergency Contact: Mersch,Beverly  [place] Phone: [phone] Relation: Niece  Advanced Directive information Does patient have an advance directive?: No, Would patient like information on creating an advanced directive?: No - patient declined information FULL CODE Chief Complaint  Patient presents with  . Medical Management of Chronic Issues    Routine Visit/ [facility]    HPI:  82 yo male long term resident seen today for f/u. He is c/a roommate. No pain. Tolerating PT. No falls. Appetite ok. No nursing issues.   HTN - BP stable on losartan, norvasc.  Constipation - stable on miralax  Anemia - iron deficiency; stable on iron supplement. 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 He takes uristat  CKD - Cr stable at 1.35  He takes vitamins, minerals and nutritional supplement  He tries to get as much exercise as he can on a daily basis. He ambulates with standard w/c.     Past Medical History  Diagnosis Date  . Hypertension   . Urinary retention     Past Surgical History  Procedure Laterality Date  . Fracture surgery    . Hip fracture surgery Right 2014  . Hip arthroplasty Left 05/01/2013    Procedure: ARTHROPLASTY BIPOLAR HIP;  Surgeon: Mauri Pole, MD;  Location: [facility];  Service: Orthopedics;  Laterality: Left;    Patient Care Team: Hennie Duos, MD as PCP - General (Internal Medicine)  Social History   Social History  . Marital Status: Widowed    Spouse Name: N/A  . Number of Children: N/A  . Years of Education: N/A   Occupational History  . Not on file.   Social History Main Topics  .  Smoking status: Former Smoker    Types: Cigarettes, Pipe, Cigars    Quit date: 11/02/1952  . Smokeless tobacco: Former Systems developer    Types: Snuff, Chew  . Alcohol Use: Yes     Comment: 11/02/2015 "used alcohol way back in my early years"  . Drug Use: No  . Sexual Activity: No   Other Topics Concern  . Not on file   Social History Narrative     reports that he quit smoking about 63 years ago. His smoking use included Cigarettes, Pipe, and Cigars. He has quit using smokeless tobacco. His smokeless tobacco use included Snuff and Chew. He reports that he drinks alcohol. He reports that he does not use illicit drugs.  No family history on file. Neg FHx No family status information on file.    Immunization History  Administered Date(s) Administered  . Influenza-Unspecified 05/05/2015  . PPD Test 07/17/2013    No Known Allergies  Medications: Patient's Medications  New Prescriptions   No medications on file  Previous Medications   ACETAMINOPHEN (TYLENOL) 325 MG TABLET    Take 650 mg by mouth every 6 (six) hours as needed for mild pain or moderate pain.   ALPRAZOLAM (XANAX) 0.25 MG TABLET    Take one tablet by mouth twice daily for anixety   AMINO ACIDS-PROTEIN HYDROLYS (FEEDING SUPPLEMENT, PRO-STAT SUGAR FREE 64,) LIQD    Take 30 mLs by mouth 2 (two) times daily.   AMLODIPINE (NORVASC) 5 MG TABLET  Take 5 mg by mouth daily.   CHOLECALCIFEROL (VITAMIN D3) 50000 UNITS TABS    Take 1 tablet by mouth every 30 (thirty) days.   CRANBERRY-VITAMIN C-INULIN (UTI-STAT) LIQD    Take 30 mLs by mouth daily.   CYANOCOBALAMIN 1000 MCG TABLET    Take 1,000 mcg by mouth daily.    FERROUS SULFATE 325 (65 FE) MG TABLET    Take 1 tablet (325 mg total) by mouth daily with breakfast. For anemia   GUAIFENESIN (MUCINEX) 600 MG 12 HR TABLET    Take 600 mg by mouth 2 (two) times daily.   GUAIFENESIN (ROBITUSSIN) 100 MG/5ML LIQUID    Take 200 mg by mouth 3 (three) times daily as needed for cough.   LORATADINE  (CLARITIN) 10 MG TABLET    Take 10 mg by mouth daily.   PHENAZOPYRIDINE (URISTAT) 95 MG TABLET    Take 95 mg by mouth 3 (three) times daily.   POLYETHYLENE GLYCOL (MIRALAX / GLYCOLAX) PACKET    Take 17 g by mouth daily. For constipation   VITAMIN C (ASCORBIC ACID) 500 MG TABLET    Take 500 mg by mouth 2 (two) times daily.  Modified Medications   No medications on file  Discontinued Medications   No medications on file    Review of Systems  Genitourinary: Positive for difficulty urinating.  Musculoskeletal: Positive for arthralgias and gait problem.  All other systems reviewed and are negative.   Filed Vitals:   01/01/16 1535  BP: 115/69  Pulse: 70  Temp: 98.2 F (36.8 C)  TempSrc: Oral  Resp: 18  Height: 6' 2"  (1.88 m)  Weight: 144 lb (65.318 kg)   Body mass index is 18.48 kg/(m^2).  Physical Exam  Constitutional: He is oriented to person, place, and time. He appears well-developed.  Frail appearing sitting in w/c in NAD  HENT:  Mouth/Throat: No oropharyngeal exudate.  MMM   Eyes: Pupils are equal, round, and reactive to light. No scleral icterus.  Neck: Neck supple. Carotid bruit is not present. No thyromegaly present.  Cardiovascular: Normal rate, regular rhythm and intact distal pulses.  Exam reveals no gallop and no friction rub.   Murmur (1/6 sem) heard. Trace distal LE swelling. No calf TTP  Pulmonary/Chest: Effort normal and breath sounds normal. He has no wheezes. He has no rales. He exhibits no tenderness.  Abdominal: Soft. Bowel sounds are normal. He exhibits no distension, no abdominal bruit, no pulsatile midline mass and no mass. There is no tenderness. There is no rebound and no guarding.  Genitourinary:  Foley cath intact and DTG clear yellow urine. No gross blood or blood clots  Musculoskeletal: He exhibits edema.  Thoracic kyphosis  Lymphadenopathy:    He has no cervical adenopathy.  Neurological: He is alert and oriented to person, place, and time.    Skin: Skin is warm and dry. No rash noted.  Psychiatric: He has a normal mood and affect. His behavior is normal. Thought content normal.     Labs reviewed: Nursing Home on 01/01/2016  Component Date Value Ref Range Status  . Glucose 12/02/2015 70   Final  . BUN 12/02/2015 32* 4 - 21 mg/dL Final  . Creatinine 12/02/2015 1.2  0.6 - 1.3 mg/dL Final  . Potassium 12/02/2015 5.1  3.4 - 5.3 mmol/L Final  . Sodium 12/02/2015 136* 137 - 147 mmol/L Final  . Hemoglobin 11/02/2015 10.3* 13.5 - 17.5 g/dL Final  . HCT 11/02/2015 31* 41 - 53 % Final  .  Platelets 11/02/2015 232  150 - 399 K/L Final  . WBC 11/02/2015 3.4   Final  . Glucose 11/02/2015 74   Final  . BUN 11/02/2015 37* 4 - 21 mg/dL Final  . Creatinine 11/02/2015 1.4* 0.6 - 1.3 mg/dL Final  . Potassium 11/02/2015 4.7  3.4 - 5.3 mmol/L Final  . Sodium 11/02/2015 140  137 - 147 mmol/L Final  . Alkaline Phosphatase 11/02/2015 64  25 - 125 U/L Final  . ALT 11/02/2015 12  10 - 40 U/L Final  . AST 11/02/2015 19  14 - 40 U/L Final  . Bilirubin, Total 11/02/2015 1.0   Final  Admission on 11/03/2015, Discharged on 11/05/2015  Component Date Value Ref Range Status  . Sodium 11/03/2015 136  135 - 145 mmol/L Final  . Potassium 11/03/2015 5.0  3.5 - 5.1 mmol/L Final  . Chloride 11/03/2015 103  101 - 111 mmol/L Final  . BUN 11/03/2015 44* 6 - 20 mg/dL Final  . Creatinine, Ser 11/03/2015 1.70* 0.61 - 1.24 mg/dL Final  . Glucose, Bld 11/03/2015 72  65 - 99 mg/dL Final  . Calcium, Ion 11/03/2015 1.14  1.13 - 1.30 mmol/L Final  . TCO2 11/03/2015 21  0 - 100 mmol/L Final  . Hemoglobin 11/03/2015 10.2* 13.0 - 17.0 g/dL Final  . HCT 11/03/2015 30.0* 39.0 - 52.0 % Final  . Color, Urine 11/03/2015 RED* YELLOW Final   BIOCHEMICALS MAY BE AFFECTED BY COLOR  . APPearance 11/03/2015 TURBID* CLEAR Final  . Specific Gravity, Urine 11/03/2015 1.013  1.005 - 1.030 Final  . pH 11/03/2015 8.0  5.0 - 8.0 Final  . Glucose, UA 11/03/2015 NEGATIVE  NEGATIVE  mg/dL Final  . Hgb urine dipstick 11/03/2015 LARGE* NEGATIVE Final  . Bilirubin Urine 11/03/2015 MODERATE* NEGATIVE Final  . Ketones, ur 11/03/2015 15* NEGATIVE mg/dL Final  . Protein, ur 11/03/2015 >300* NEGATIVE mg/dL Final  . Nitrite 11/03/2015 POSITIVE* NEGATIVE Final  . Leukocytes, UA 11/03/2015 LARGE* NEGATIVE Final  . WBC 11/03/2015 22.8* 4.0 - 10.5 K/uL Final  . RBC 11/03/2015 2.86* 4.22 - 5.81 MIL/uL Final  . Hemoglobin 11/03/2015 9.0* 13.0 - 17.0 g/dL Final  . HCT 11/03/2015 27.4* 39.0 - 52.0 % Final  . MCV 11/03/2015 95.8  78.0 - 100.0 fL Final  . MCH 11/03/2015 31.5  26.0 - 34.0 pg Final  . MCHC 11/03/2015 32.8  30.0 - 36.0 g/dL Final  . RDW 11/03/2015 14.7  11.5 - 15.5 % Final  . Platelets 11/03/2015 166  150 - 400 K/uL Final  . Neutrophils Relative % 11/03/2015 91   Final  . Lymphocytes Relative 11/03/2015 3   Final  . Monocytes Relative 11/03/2015 6   Final  . Eosinophils Relative 11/03/2015 0   Final  . Basophils Relative 11/03/2015 0   Final  . Neutro Abs 11/03/2015 20.7* 1.7 - 7.7 K/uL Final  . Lymphs Abs 11/03/2015 0.7  0.7 - 4.0 K/uL Final  . Monocytes Absolute 11/03/2015 1.4* 0.1 - 1.0 K/uL Final  . Eosinophils Absolute 11/03/2015 0.0  0.0 - 0.7 K/uL Final  . Basophils Absolute 11/03/2015 0.0  0.0 - 0.1 K/uL Final  . WBC Morphology 11/03/2015 INCREASED BANDS (>20% BANDS)   Final  . Specimen Description 11/03/2015 URINE, CATHETERIZED   Final  . Special Requests 11/03/2015 NONE   Final  . Culture 11/03/2015 NO GROWTH 1 DAY   Final  . Report Status 11/03/2015 11/04/2015 FINAL   Final  . Squamous Epithelial / LPF 11/03/2015 6-30* NONE SEEN  Final  . WBC, UA 11/03/2015 TOO NUMEROUS TO COUNT  0 - 5 WBC/hpf Final  . RBC / HPF 11/03/2015 TOO NUMEROUS TO COUNT  0 - 5 RBC/hpf Final  . Bacteria, UA 11/03/2015 FEW* NONE SEEN Final  . Crystals 11/03/2015 TRIPLE PHOSPHATE CRYSTALS* NEGATIVE Final  . Sodium 11/04/2015 137  135 - 145 mmol/L Final  . Potassium 11/04/2015 4.5   3.5 - 5.1 mmol/L Final  . Chloride 11/04/2015 107  101 - 111 mmol/L Final  . CO2 11/04/2015 21* 22 - 32 mmol/L Final  . Glucose, Bld 11/04/2015 77  65 - 99 mg/dL Final  . BUN 11/04/2015 40* 6 - 20 mg/dL Final  . Creatinine, Ser 11/04/2015 1.45* 0.61 - 1.24 mg/dL Final  . Calcium 11/04/2015 8.7* 8.9 - 10.3 mg/dL Final  . GFR calc non Af Amer 11/04/2015 39* >60 mL/min Final  . GFR calc Af Amer 11/04/2015 45* >60 mL/min Final   Comment: (NOTE) The eGFR has been calculated using the CKD EPI equation. This calculation has not been validated in all clinical situations. eGFR's persistently <60 mL/min signify possible Chronic Kidney Disease.   . Anion gap 11/04/2015 9  5 - 15 Final  . WBC 11/04/2015 15.5* 4.0 - 10.5 K/uL Final  . RBC 11/04/2015 2.55* 4.22 - 5.81 MIL/uL Final  . Hemoglobin 11/04/2015 7.6* 13.0 - 17.0 g/dL Final   Comment: DELTA CHECK NOTED REPEATED TO VERIFY   . HCT 11/04/2015 24.2* 39.0 - 52.0 % Final  . MCV 11/04/2015 94.9  78.0 - 100.0 fL Final  . MCH 11/04/2015 29.8  26.0 - 34.0 pg Final  . MCHC 11/04/2015 31.4  30.0 - 36.0 g/dL Final  . RDW 11/04/2015 14.7  11.5 - 15.5 % Final  . Platelets 11/04/2015 180  150 - 400 K/uL Final  . ABO/RH(D) 11/04/2015 O POS   Final  . Antibody Screen 11/04/2015 NEG   Final  . Sample Expiration 11/04/2015 11/07/2015   Final  . Unit Number 11/04/2015 [id number]   Final  . Blood Component Type 11/04/2015 RBC LR PHER2   Final  . Unit division 11/04/2015 00   Final  . Status of Unit 11/04/2015 ISSUED,FINAL   Final  . Transfusion Status 11/04/2015 OK TO TRANSFUSE   Final  . Crossmatch Result 11/04/2015 Compatible   Final  . Order Confirmation 11/04/2015 ORDER PROCESSED BY BLOOD BANK   Final  . WBC 11/05/2015 15.0* 4.0 - 10.5 K/uL Final   WHITE COUNT CONFIRMED ON SMEAR  . RBC 11/05/2015 3.06* 4.22 - 5.81 MIL/uL Final  . Hemoglobin 11/05/2015 9.2* 13.0 - 17.0 g/dL Final  . HCT 11/05/2015 28.4* 39.0 - 52.0 % Final  . MCV 11/05/2015  92.8  78.0 - 100.0 fL Final  . MCH 11/05/2015 30.1  26.0 - 34.0 pg Final  . MCHC 11/05/2015 32.4  30.0 - 36.0 g/dL Final  . RDW 11/05/2015 15.0  11.5 - 15.5 % Final  . Platelets 11/05/2015 PLATELET CLUMPS NOTED ON SMEAR, COUNT APPEARS ADEQUATE  150 - 400 K/uL Final  . Sodium 11/05/2015 137  135 - 145 mmol/L Final  . Potassium 11/05/2015 4.5  3.5 - 5.1 mmol/L Final  . Chloride 11/05/2015 108  101 - 111 mmol/L Final  . CO2 11/05/2015 20* 22 - 32 mmol/L Final  . Glucose, Bld 11/05/2015 78  65 - 99 mg/dL Final  . BUN 11/05/2015 24* 6 - 20 mg/dL Final  . Creatinine, Ser 11/05/2015 1.22  0.61 - 1.24 mg/dL Final  . Calcium  11/05/2015 9.2  8.9 - 10.3 mg/dL Final  . Total Protein 11/05/2015 6.4* 6.5 - 8.1 g/dL Final  . Albumin 11/05/2015 2.9* 3.5 - 5.0 g/dL Final  . AST 11/05/2015 32  15 - 41 U/L Final  . ALT 11/05/2015 17  17 - 63 U/L Final  . Alkaline Phosphatase 11/05/2015 59  38 - 126 U/L Final  . Total Bilirubin 11/05/2015 1.2  0.3 - 1.2 mg/dL Final  . GFR calc non Af Amer 11/05/2015 48* >60 mL/min Final  . GFR calc Af Amer 11/05/2015 56* >60 mL/min Final   Comment: (NOTE) The eGFR has been calculated using the CKD EPI equation. This calculation has not been validated in all clinical situations. eGFR's persistently <60 mL/min signify possible Chronic Kidney Disease.   . Anion gap 11/05/2015 9  5 - 15 Final    No results found.   Assessment/Plan   ICD-9-CM ICD-10-CM   1. Prostatic hypertrophy, benign, with obstruction 600.21 N40.1    599.69 N13.8   2. Chronic indwelling Foley catheter V45.89 Z92.89   3. Essential hypertension, benign 401.1 I10   4. Protein-calorie malnutrition, severe ([place]) 262 E43   5. CKD (chronic kidney disease) stage 2, GFR 60-89 ml/min 585.2 N18.2   6. Iron deficiency anemia 280.9 D50.9     [facility] NP to follow  Cont current meds as ordered  PT/OT/ST as ordered  Nutritional supplements as ordered  Will follow  Minie Roadcap S. Perlie Gold  [facility] [address] [place], [place] [address] [phone] Cell (Monday-Friday 8 AM - 5 PM) [phone] After 5 PM and follow prompts

## 2016-01-31 ENCOUNTER — Other Ambulatory Visit: Payer: Self-pay

## 2016-01-31 MED ORDER — ALPRAZOLAM 0.25 MG PO TABS: ORAL_TABLET | ORAL | Status: DC

## 2016-01-31 NOTE — Telephone Encounter (Signed)
Refill request from [facility]

## 2016-02-12 ENCOUNTER — Non-Acute Institutional Stay (SKILLED_NURSING_FACILITY): Payer: Medicare Other | Admitting: Internal Medicine

## 2016-02-12 ENCOUNTER — Encounter: Payer: Self-pay | Admitting: Internal Medicine

## 2016-02-12 DIAGNOSIS — N401 Enlarged prostate with lower urinary tract symptoms: Secondary | ICD-10-CM | POA: Diagnosis not present

## 2016-02-12 DIAGNOSIS — Z9289 Personal history of other medical treatment: Secondary | ICD-10-CM | POA: Diagnosis not present

## 2016-02-12 DIAGNOSIS — R5381 Other malaise: Secondary | ICD-10-CM

## 2016-02-12 DIAGNOSIS — I10 Essential (primary) hypertension: Secondary | ICD-10-CM

## 2016-02-12 DIAGNOSIS — E43 Unspecified severe protein-calorie malnutrition: Secondary | ICD-10-CM

## 2016-02-12 DIAGNOSIS — D509 Iron deficiency anemia, unspecified: Secondary | ICD-10-CM | POA: Diagnosis not present

## 2016-02-12 DIAGNOSIS — Z978 Presence of other specified devices: Secondary | ICD-10-CM

## 2016-02-12 DIAGNOSIS — N138 Other obstructive and reflux uropathy: Secondary | ICD-10-CM | POA: Diagnosis not present

## 2016-02-12 DIAGNOSIS — N182 Chronic kidney disease, stage 2 (mild): Secondary | ICD-10-CM | POA: Diagnosis not present

## 2016-02-12 DIAGNOSIS — Z960 Presence of urogenital implants: Secondary | ICD-10-CM

## 2016-02-12 NOTE — Progress Notes (Signed)
Patient ID: [name] Bennett, male   DOB: 01-06-1918, 82 y.o.   MRN: [id number]     DATE: 02/12/16  Location:  Nursing Home Location: [facility]  Nursing Home Room Number: 222 B Place of Service: SNF (31)   Extended Emergency Contact Information Primary Emergency Contact: Beaulah Dinning [place] Home Phone: [phone] Relation: Niece Secondary Emergency Contact: Christians,Beverly  [place] Home Phone: [phone] Relation: Niece  Advanced Directive information Does patient have an advance directive?: No, Would patient like information on creating an advanced directive?: No - patient declined information FULL CODE Chief Complaint  Patient presents with  . Medical Management of Chronic Issues    Routine Visit/ [facility]    HPI:  82 yo male long term resident seen today for f/u. No pain. Tolerating PT. No falls. Appetite ok. No nursing issues.   HTN - BP stable on losartan, norvasc.  Constipation - stable on miralax  Anemia - iron deficiency; stable on iron supplement. 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 He takes uristat  CKD - Cr stable at 1.2  He takes vitamins, minerals and nutritional supplement. Albumin 2.9.  He tries to get as much exercise as he can on a daily basis. He ambulates with standard w/c.     Past Medical History  Diagnosis Date  . Hypertension   . Urinary retention     Past Surgical History  Procedure Laterality Date  . Fracture surgery    . Hip fracture surgery Right 2014  . Hip arthroplasty Left 05/01/2013    Procedure: ARTHROPLASTY BIPOLAR HIP;  Surgeon: Shelda Pal, MD;  Location: [facility];  Service: Orthopedics;  Laterality: Left;    Patient Care Team: Margit Hanks, MD as PCP - General (Internal Medicine)  Social History   Social History  . Marital Status: Widowed    Spouse Name: N/A  . Number of Children: N/A  . Years of  Education: N/A   Occupational History  . Not on file.   Social History Main Topics  . Smoking status: Former Smoker    Types: Cigarettes, Pipe, Cigars    Quit date: 11/02/1952  . Smokeless tobacco: Former Neurosurgeon    Types: Snuff, Chew  . Alcohol Use: Yes     Comment: 11/02/2015 "used alcohol way back in my early years"  . Drug Use: No  . Sexual Activity: No   Other Topics Concern  . Not on file   Social History Narrative     reports that he quit smoking about 63 years ago. His smoking use included Cigarettes, Pipe, and Cigars. He has quit using smokeless tobacco. His smokeless tobacco use included Snuff and Chew. He reports that he drinks alcohol. He reports that he does not use illicit drugs.  History reviewed. No pertinent family history. No family status information on file.    Immunization History  Administered Date(s) Administered  . Influenza-Unspecified 05/05/2015  . PPD Test 07/17/2013    No Known Allergies  Medications: Patient's Medications  New Prescriptions   No medications on file  Previous Medications   ACETAMINOPHEN (TYLENOL) 325 MG TABLET    Take 650 mg by mouth every 6 (six) hours as needed for mild pain or moderate pain.   ALPRAZOLAM (XANAX) 0.25 MG TABLET    Take one tablet by mouth twice daily for anixety   AMINO ACIDS-PROTEIN HYDROLYS (FEEDING SUPPLEMENT, PRO-STAT SUGAR FREE 64,) LIQD    Take  30 mLs by mouth 2 (two) times daily.   AMLODIPINE (NORVASC) 5 MG TABLET    Take 5 mg by mouth daily.   CHOLECALCIFEROL (VITAMIN D3) 50000 UNITS TABS    Take 1 tablet by mouth every 30 (thirty) days.   CRANBERRY-VITAMIN C-INULIN (UTI-STAT) LIQD    Take 30 mLs by mouth daily.   CYANOCOBALAMIN 1000 MCG TABLET    Take 1,000 mcg by mouth daily.    FERROUS SULFATE 325 (65 FE) MG TABLET    Take 1 tablet (325 mg total) by mouth daily with breakfast. For anemia   LORATADINE (CLARITIN) 10 MG TABLET    Take 10 mg by mouth daily.   PHENAZOPYRIDINE (URISTAT) 95 MG TABLET     Take 95 mg by mouth 3 (three) times daily.   POLYETHYLENE GLYCOL (MIRALAX / GLYCOLAX) PACKET    Take 17 g by mouth daily. For constipation   SUNSCREENS (GENERAL PROTECTION SUNSCREEN) LOTN    Apply topically. Apply sun screen to affected areas topically as needed prior to sun exposure   VITAMIN C (ASCORBIC ACID) 500 MG TABLET    Take 500 mg by mouth 2 (two) times daily.  Modified Medications   No medications on file  Discontinued Medications   GUAIFENESIN (MUCINEX) 600 MG 12 HR TABLET    Take 600 mg by mouth 2 (two) times daily. Reported on 02/12/2016   GUAIFENESIN (ROBITUSSIN) 100 MG/5ML LIQUID    Take 200 mg by mouth 3 (three) times daily as needed for cough. Reported on 02/12/2016    Review of Systems  Genitourinary: Positive for difficulty urinating.  Musculoskeletal: Positive for gait problem. Negative for arthralgias.  All other systems reviewed and are negative.   Filed Vitals:   02/12/16 1136  BP: 115/69  Pulse: 70  Temp: 98 F (36.7 C)  TempSrc: Oral  Resp: 18  Height: 6\' 2"  (1.88 m)  Weight: 140 lb 3.2 oz (63.594 kg)   Body mass index is 17.99 kg/(m^2).  Physical Exam  Constitutional: He is oriented to person, place, and time. He appears well-developed.  Frail appearing sitting in w/c in NAD  HENT:  Mouth/Throat: No oropharyngeal exudate.  MMM   Eyes: Pupils are equal, round, and reactive to light. No scleral icterus.  Neck: Neck supple. Carotid bruit is not present. No thyromegaly present.  Cardiovascular: Normal rate, regular rhythm and intact distal pulses.  Exam reveals no gallop and no friction rub.   Murmur (1/6 sem) heard. Trace distal LE swelling. No calf TTP  Pulmonary/Chest: Effort normal and breath sounds normal. He has no wheezes. He has no rales. He exhibits no tenderness.  Abdominal: Soft. Bowel sounds are normal. He exhibits no distension, no abdominal bruit, no pulsatile midline mass and no mass. There is no tenderness. There is no rebound and no  guarding.  Genitourinary:  Foley cath intact and DTG clear yellow urine. No gross blood or blood clots  Musculoskeletal: He exhibits edema.  Thoracic kyphosis  Lymphadenopathy:    He has no cervical adenopathy.  Neurological: He is alert and oriented to person, place, and time.  Skin: Skin is warm and dry. No rash noted.  Psychiatric: He has a normal mood and affect. His behavior is normal. Thought content normal.     Labs reviewed: Nursing Home on 01/01/2016  Component Date Value Ref Range Status  . Glucose 12/02/2015 70   Final  . BUN 12/02/2015 32* 4 - 21 mg/dL Final  . Creatinine 69/62/952805/12/2015 1.2  0.6 -  1.3 mg/dL Final  . Potassium 45/40/9811 5.1  3.4 - 5.3 mmol/L Final  . Sodium 12/02/2015 136* 137 - 147 mmol/L Final  . Hemoglobin 11/02/2015 10.3* 13.5 - 17.5 g/dL Final  . HCT 91/47/8295 31* 41 - 53 % Final  . Platelets 11/02/2015 232  150 - 399 K/L Final  . WBC 11/02/2015 3.4   Final  . Glucose 11/02/2015 74   Final  . BUN 11/02/2015 37* 4 - 21 mg/dL Final  . Creatinine 62/13/0865 1.4* 0.6 - 1.3 mg/dL Final  . Potassium 78/46/9629 4.7  3.4 - 5.3 mmol/L Final  . Sodium 11/02/2015 140  137 - 147 mmol/L Final  . Alkaline Phosphatase 11/02/2015 64  25 - 125 U/L Final  . ALT 11/02/2015 12  10 - 40 U/L Final  . AST 11/02/2015 19  14 - 40 U/L Final  . Bilirubin, Total 11/02/2015 1.0   Final    No results found.   Assessment/Plan   ICD-9-CM ICD-10-CM   1. Prostatic hypertrophy, benign, with obstruction 600.21 N40.1    599.69 N13.8   2. Chronic indwelling Foley catheter V45.89 Z92.89   3. Essential hypertension, benign 401.1 I10   4. Iron deficiency anemia 280.9 D50.9   5. Protein-calorie malnutrition, severe ([facility]) 262 E43   6. CKD (chronic kidney disease) stage 2, GFR 60-89 ml/min 585.2 N18.2   7. Physical deconditioning 799.3 R53.81     [facility] NP to follow  F/u with specialists as scheduled  Pt is medically stable on current tx plan. Continue current medications as  ordered. PT/OT/ST as indicated. Will follow    Khaniya Tenaglia S. Ancil Linsey  [facility] [address] [place], [place] [address] [phone] Cell (Monday-Friday 8 AM - 5 PM) [phone] After 5 PM and follow prompts

## 2016-03-30 LAB — BASIC METABOLIC PANEL
BUN: 31 mg/dL — AB (ref 4–21)
CREATININE: 1.2 mg/dL (ref 0.6–1.3)
Glucose: 80 mg/dL
POTASSIUM: 4.8 mmol/L (ref 3.4–5.3)
SODIUM: 137 mmol/L (ref 137–147)

## 2016-03-30 LAB — HEPATIC FUNCTION PANEL
ALT: 10 U/L (ref 10–40)
AST: 23 U/L (ref 14–40)
Alkaline Phosphatase: 64 U/L (ref 25–125)
Bilirubin, Total: 0.6 mg/dL

## 2016-03-30 LAB — CBC AND DIFFERENTIAL
HCT: 28 % — AB (ref 41–53)
HEMOGLOBIN: 9.3 g/dL — AB (ref 13.5–17.5)
Platelets: 239 10*3/uL (ref 150–399)
WBC: 5.5 10*3/mL

## 2016-04-09 ENCOUNTER — Encounter: Payer: Self-pay | Admitting: Internal Medicine

## 2016-04-09 ENCOUNTER — Non-Acute Institutional Stay (SKILLED_NURSING_FACILITY): Payer: Medicare Other | Admitting: Internal Medicine

## 2016-04-09 DIAGNOSIS — I10 Essential (primary) hypertension: Secondary | ICD-10-CM | POA: Diagnosis not present

## 2016-04-09 DIAGNOSIS — I714 Abdominal aortic aneurysm, without rupture, unspecified: Secondary | ICD-10-CM

## 2016-04-09 DIAGNOSIS — D509 Iron deficiency anemia, unspecified: Secondary | ICD-10-CM | POA: Diagnosis not present

## 2016-04-09 NOTE — Patient Instructions (Signed)
CBC will be checked to assess need for continuing iron supplement. Minimal blood pressure goal equal average less than 140/90 due to the aortic aneurysm. As he is asymptomatic and in the context of his advanced age,CVTS referral will not be pursued as it has not been completed to date.

## 2016-04-09 NOTE — Assessment & Plan Note (Signed)
Last hemoglobin and hematocrit 9.2/28.4 on 11/05/15. Hemoglobin and hematocrit will be updated to see if he still needs the iron supplementation.

## 2016-04-09 NOTE — Assessment & Plan Note (Signed)
Blood pressure is adequately controlled, no change in present regimen indicated Minimal goal is less than 140/90 due to PMH of aortic aneurysm

## 2016-04-09 NOTE — Assessment & Plan Note (Signed)
Focus will be on blood pressure control of minimal average less than 140/90 Consideration of surgery in 82 year old obviously problematic

## 2016-04-09 NOTE — Progress Notes (Signed)
Facility Location: [facility]  Room Number: 222-B  Code Status: Full Code   This is a nursing facility follow up for specific acute issue of chronic medical diagnoses  Interim medical record and care since last [facility] was updated with review of diagnostic studies and change in clinical status since last visit were documented.  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 He's had post fracture hip surgery bilaterally. He's also had a herniorrhaphy. Serial CT scans 2014-2017 revealed 5 mm increase in size of infrarenal aneurysm, 5.6 cm on 11/04/15.  Renal insufficiency has been stable with creatinine of 1.22 or less. Last hemoglobin and hematocrit on record were 9.2 and 28.4 on 11/05/15. He continues 325 mg of iron daily. The last ferritin on record was 98 on 04/28/13.  Review of systems: He states that he is "blessed" with no significant symptoms ; he specifically denies any bleeding dyscrasias or abdominal pain. He does describe some minor rhinitis at times. He is on loratadine. Constitutional: No fever,significant weight change, fatigue  Eyes: No redness, discharge, pain, vision change ENT/mouth: No nasal congestion,  purulent discharge, earache,change in hearing ,sore throat  Cardiovascular: No chest pain, palpitations,paroxysmal nocturnal dyspnea, claudication, edema  Respiratory: No cough, sputum production,hemoptysis, DOE , significant snoring,apnea  Gastrointestinal: No heartburn,dysphagia,abdominal pain, nausea / vomiting,rectal bleeding, melena,change in bowels Genitourinary: No dysuria,hematuria, pyuria,  incontinence, nocturia Musculoskeletal: No joint stiffness, joint swelling, weakness,pain Dermatologic: No rash, pruritus, change in appearance of  skin Neurologic: No dizziness,headache,syncope, seizures, numbness , tingling Psychiatric: No significant anxiety , depression, insomnia, anorexia Endocrine: No change in hair/skin/ nails, excessive thirst, excessive hunger, excessive urination  Hematologic/lymphatic: No significant bruising, lymphadenopathy,abnormal bleeding Allergy/immunology: No itchy/ watery eyes, urticaria, angioedema  Physical exam:  Pertinent or positive findings: He appears much younger than stated age. There is visible wasting @ the temples. He has a IT consultantgoatee. He is missing an anterior crown. He has a slightly staccato type speech pattern  and intermittent mandibular tremor. Foley catheter is in place. Urine is somewhat copper colored. Interosseous wasting of the hands is present but strength is extremely good. He is oriented although he had difficulty with the day of the month. He was cognizant of the fact that his driver's license is good for several more years. In fact he calculated the year back dating from the expiration of his license. General appearance:Thin but adequately nourished; no acute distress , increased work of breathing is present.   Lymphatic: No lymphadenopathy about the head, neck, axilla . Eyes: No conjunctival inflammation or lid edema is present. There is no scleral icterus. Ears:  External ear exam shows no significant lesions or deformities.   Nose:  External nasal examination shows no deformity or inflammation. Nasal mucosa are pink and moist without lesions ,exudates Oral exam: lips and gums are healthy appearing.There is no oropharyngeal erythema or exudate . Neck:  No thyromegaly, masses, tenderness noted.    Heart:  Normal rate and regular rhythm. S1 and S2 normal without gallop, murmur, click, rub .  Lungs:Chest clear to auscultation without wheezes, rhonchi,rales , rubs. Abdomen:Bowel sounds are normal. Abdomen is soft and nontender with no organomegaly, hernias,masses. GU: deferred   Extremities:  No cyanosis, clubbing,edema  Neurologic exam : Deep tendon reflexes are equal Skin: Warm & dry w/o tenting. No significant lesions or rash.  See summary under each active problem in the Problem List with associated updated therapeutic plan

## 2016-05-23 LAB — CBC AND DIFFERENTIAL
HEMATOCRIT: 27 % — AB (ref 41–53)
HEMOGLOBIN: 8.8 g/dL — AB (ref 13.5–17.5)
NEUTROS ABS: 2652 /uL
Platelets: 213 10*3/uL (ref 150–399)
WBC: 5.2 10*3/mL

## 2016-05-24 ENCOUNTER — Other Ambulatory Visit: Payer: Self-pay | Admitting: *Deleted

## 2016-05-24 MED ORDER — ALPRAZOLAM 0.25 MG PO TABS: ORAL_TABLET | ORAL | 0 refills | Status: DC

## 2016-07-11 ENCOUNTER — Encounter: Payer: Self-pay | Admitting: Internal Medicine

## 2016-07-11 ENCOUNTER — Non-Acute Institutional Stay (SKILLED_NURSING_FACILITY): Payer: Medicare Other | Admitting: Internal Medicine

## 2016-07-11 DIAGNOSIS — K5909 Other constipation: Secondary | ICD-10-CM

## 2016-07-11 DIAGNOSIS — I714 Abdominal aortic aneurysm, without rupture, unspecified: Secondary | ICD-10-CM

## 2016-07-11 DIAGNOSIS — I10 Essential (primary) hypertension: Secondary | ICD-10-CM

## 2016-07-11 DIAGNOSIS — D509 Iron deficiency anemia, unspecified: Secondary | ICD-10-CM | POA: Diagnosis not present

## 2016-07-11 NOTE — Assessment & Plan Note (Signed)
He is not a surgical candidate due to advanced age and location of the aneurysm

## 2016-07-11 NOTE — Assessment & Plan Note (Signed)
BP controlled; no change in antihypertensive medications  

## 2016-07-11 NOTE — Patient Instructions (Signed)
See Current Assessment & Plan in Problem List under specific Diagnosis 

## 2016-07-11 NOTE — Assessment & Plan Note (Signed)
Iron supplementation may be contributing to his constipation. In view of results of the iron studies above, iron will be decreased to Monday, Wednesday, and Friday.

## 2016-07-11 NOTE — Assessment & Plan Note (Signed)
Last TSH on record was 2012, in view of the chronic constipation it should be rechecked

## 2016-07-11 NOTE — Progress Notes (Signed)
    Facility Location: [facility]  Room Number: 222-B  Code Status: Full Code   This is a nursing facility follow up for chronic medical diagnoses  Interim medical record and care [facility] was updated with review of diagnostic studies and change in clinical status since last visit were documented.  HPI: The patient has medical diagnoses of essential hypertension, BPH with obstruction for which he has a chronic indwelling Foley catheter, stable/chronic renal insufficiency, and iron deficiency anemia. CT scans have revealed some increase in the infrarenal aneurysm during the period 2014-2017. [employer] has been evaluating anemia. In April hematocrit was 28.4, it was 28 on 9/2 and 27 on 05/23/16. Anemia had been normochromic normocytic. On 10/30 iron, B12, folate, and ferritin were all normal. In fact ferritin was supernormal at 514. Hemoccults were negative 3 Renal function has been stable with only mild azotemia.  Review of systems: According to staff the patient has had increased anxiety when his Xanax dose was decreased in July. He is described as obsessing over his bowels. He is on MiraLAX daily. He describes suprapubic pain if his Foley catheter becomes obstructed. This resolves when the Foley is changed. Epistaxis, hemoptysis, hematuria, melena, or rectal bleeding denied. No unexplained weight loss, significant dyspepsia,dysphagia, or abdominal pain.  There is no abnormal bruising , bleeding, or difficulty stopping bleeding with injury.   Physical exam:  Pertinent or positive findings: He is thin but adequately nourished. He has dense arcus senilis. Pattern alopecia is present. He has a Research officer, trade unionmustache and goatee. He exhibits a stammering type speech pattern intermittently.  He has minor rales on auscultation chest without increased work of breathing. No abdominal aneurysm  is appreciated clinically. Pedal pulses are decreased. Limbs  are thin but strength is excellent.  General appearance: no acute distress , increased work of breathing is present.   Lymphatic: No lymphadenopathy about the head, neck, axilla . Eyes: No conjunctival inflammation or lid edema is present. There is no scleral icterus. Ears:  External ear exam shows no significant lesions or deformities.   Nose:  External nasal examination shows no deformity or inflammation. Nasal mucosa are pink and moist without lesions ,exudates Oral exam:L anterior incisor fractured; lips and gums are healthy appearing.There is no oropharyngeal erythema or exudate . Neck:  No thyromegaly, masses, tenderness noted.    Heart:  Normal rate and regular rhythm. S1 and S2 normal without gallop, murmur, click, rub .  Abdomen:Bowel sounds are normal. Abdomen is soft and nontender with no organomegaly, hernias,masses. GU: Foley in place  Extremities:  No cyanosis, clubbing,edema  Neurologic exam : Strength equal  in upper & lower extremities & good Skin: Warm & dry w/o tenting. No significant lesions or rash.  See summary under each active problem in the Problem List with associated updated therapeutic plan

## 2016-08-19 LAB — CBC AND DIFFERENTIAL
HEMATOCRIT: 29 % — AB (ref 41–53)
HEMOGLOBIN: 9.6 g/dL — AB (ref 13.5–17.5)
PLATELETS: 224 10*3/uL (ref 150–399)
WBC: 5.1 10*3/mL

## 2016-08-19 LAB — BASIC METABOLIC PANEL
BUN: 27 mg/dL — AB (ref 4–21)
CREATININE: 1.1 mg/dL (ref <=1.3)
Glucose: 83 mg/dL
Potassium: 4.7 mmol/L (ref 3.4–5.3)
Sodium: 138 mmol/L (ref 137–147)

## 2016-08-19 LAB — VITAMIN D 25 HYDROXY (VIT D DEFICIENCY, FRACTURES): Vit D, 25-Hydroxy: 39.49

## 2016-08-19 LAB — TSH: TSH: 0.88 u[IU]/mL (ref <=5.90)

## 2016-10-08 ENCOUNTER — Other Ambulatory Visit: Payer: Self-pay | Admitting: *Deleted

## 2016-10-08 LAB — CBC AND DIFFERENTIAL
HEMATOCRIT: 27 % — AB (ref 41–53)
HEMOGLOBIN: 8.5 g/dL — AB (ref 13.5–17.5)
PLATELETS: 233 10*3/uL (ref 150–399)
WBC: 6.2 10^3/mL

## 2016-10-08 LAB — VITAMIN B12: VITAMIN B 12: 1033

## 2016-10-08 MED ORDER — ALPRAZOLAM 0.25 MG PO TABS: ORAL_TABLET | ORAL | 5 refills | Status: DC

## 2016-10-08 NOTE — Telephone Encounter (Signed)
[facility] [phone] Fax: [phone]  

## 2016-10-10 ENCOUNTER — Encounter: Payer: Self-pay | Admitting: *Deleted

## 2016-10-31 ENCOUNTER — Non-Acute Institutional Stay (SKILLED_NURSING_FACILITY): Payer: Medicare Other | Admitting: Internal Medicine

## 2016-10-31 ENCOUNTER — Encounter: Payer: Self-pay | Admitting: Internal Medicine

## 2016-10-31 DIAGNOSIS — I10 Essential (primary) hypertension: Secondary | ICD-10-CM

## 2016-10-31 DIAGNOSIS — N182 Chronic kidney disease, stage 2 (mild): Secondary | ICD-10-CM

## 2016-10-31 DIAGNOSIS — D509 Iron deficiency anemia, unspecified: Secondary | ICD-10-CM | POA: Diagnosis not present

## 2016-10-31 NOTE — Progress Notes (Signed)
    This is a nursing facility follow up of chronic medical diagnoses  Interim medical record and care since last [facility] visit was updated with review of diagnostic studies and change in clinical status since last visit were documented.  ZOX:WRUEA saw the patient on 10/10/16, no clinical changes had been reported.Marland Kitchen He has had a history of recurrent urinary tract symptoms in the context of BPH, but no recent UTI. He does have a chronic Foley catheter. He also has chronic anemia. Values were stable with a hematocrit of 27.1 on in March, but because of a slight drop in hematocrit iron was increased to once daily from every other day. B12 will level was supernormal and ferritin was normal. Folate was also normal The patient is somewhat fixated on his bowels requesting enemas daily; but he did have an excellent response subjectively to Linzess begun in January of this year. He also takes MiraLAX daily Staff reports ingestion of 50-75 percent of meals, he does have protein caloric malnutrition which is chronic. Weight has been stable. He is on amlodipine for hypertension. Last creatinine was 1.1 on 1/22.  Review of systems:   Reports slightly decreased appetite due to food preferences.  Denies bleeding dyscrasias of melena, epistaxis, abnormal bruising.  Reports very dark orange urine ; he is on Uristat.   Constitutional: No fever,significant weight change, fatigue  Eyes: No redness, discharge, pain, vision change ENT/mouth: No nasal congestion,  purulent discharge, earache,change in hearing ,sore throat  Cardiovascular: No chest pain, palpitations,paroxysmal nocturnal dyspnea, claudication, edema  Respiratory: No cough, sputum production,hemoptysis, DOE , significant snoring,apnea  Gastrointestinal: No heartburn,dysphagia,abdominal pain, nausea / vomiting,rectal bleeding, melena,change in bowels Genitourinary: No dysuria,hematuria, pyuria,  incontinence, nocturia Musculoskeletal:  No joint stiffness, joint swelling, weakness,pain Dermatologic: No rash, pruritus, change in appearance of skin Neurologic: No dizziness,headache,syncope, seizures, numbness , tingling Psychiatric: No significant anxiety , depression, insomnia, anorexia Endocrine: No change in hair/skin/ nails, excessive thirst, excessive hunger, excessive urination  Hematologic/lymphatic: No lymphadenopathy Allergy/immunology: No itchy/ watery eyes, significant sneezing, urticaria, angioedema  Physical exam:  Pertinent or positive findings:Pattern alopecia. Patient has a goatee Arcus senilis bilaterally Area of hyperpigmentation of skin on left temporal region.  Silver tooth replacing front upper tooth Hard of hearing Slow , slightly irregular cardiac rhythm Minimal basilar rhonchi Trace bilateral lower extremity edema.  Decreased pedal pulses.  Foley catheter in place with dark orange urine  General appearance:Adequately nourished; no acute distress , increased work of breathing is present.   Lymphatic: No lymphadenopathy about the head, neck, axilla . Eyes: No conjunctival inflammation or lid edema is present. There is no scleral icterus. Ears:  External ear exam shows no significant lesions or deformities.   Nose:  External nasal examination shows no deformity or inflammation. Nasal mucosa are pink and moist without lesions ,exudates Oral exam: lips and gums are healthy appearing.There is no oropharyngeal erythema or exudate . Neck:  No thyromegaly, masses, tenderness noted.    Heart:  No gallop, murmur, click, rub .  Lungs: without wheezes, rales , rubs. Abdomen:Bowel sounds are normal. Abdomen is soft and nontender with no organomegaly, hernias,masses. GU: deferred  Extremities:  No cyanosis, clubbing Neurologic exam : Strength equal  in upper & lower extremities Balance,Rhomberg,finger to nose testing could not be completed due to clinical state Skin: Warm & dry w/o tenting. No significant  lesions or rash.  See summary under each active problem in the Problem List with associated updated therapeutic plan

## 2016-10-31 NOTE — Assessment & Plan Note (Signed)
HCTZ 12.5 mg qd BMET

## 2016-11-01 NOTE — Assessment & Plan Note (Addendum)
Anemia stable w/o evidence of blood loss Endoscopic survelliance contraindicated due to advanced age

## 2016-11-01 NOTE — Patient Instructions (Signed)
See assessment and plan under each diagnosis in the problem list and acutely for this visit 

## 2016-11-01 NOTE — Assessment & Plan Note (Signed)
Recheck BMET 

## 2016-11-02 LAB — CBC AND DIFFERENTIAL
HCT: 29 — AB (ref 41–53)
Hemoglobin: 9.8 — AB (ref 13.5–17.5)
NEUTROS ABS: 63
PLATELETS: 229 (ref 150–399)
WBC: 6.8

## 2017-01-17 LAB — BASIC METABOLIC PANEL
BUN: 29 — AB (ref 4–21)
CREATININE: 0.9 (ref 0.6–1.3)
GLUCOSE: 79
POTASSIUM: 4.6 (ref 3.4–5.3)
Sodium: 138 (ref 137–147)

## 2017-02-11 ENCOUNTER — Encounter: Payer: Self-pay | Admitting: Internal Medicine

## 2017-02-11 ENCOUNTER — Non-Acute Institutional Stay (SKILLED_NURSING_FACILITY): Payer: Medicare Other | Admitting: Internal Medicine

## 2017-02-11 DIAGNOSIS — D509 Iron deficiency anemia, unspecified: Secondary | ICD-10-CM | POA: Diagnosis not present

## 2017-02-11 DIAGNOSIS — K5909 Other constipation: Secondary | ICD-10-CM | POA: Diagnosis not present

## 2017-02-11 DIAGNOSIS — I714 Abdominal aortic aneurysm, without rupture, unspecified: Secondary | ICD-10-CM

## 2017-02-11 DIAGNOSIS — I10 Essential (primary) hypertension: Secondary | ICD-10-CM

## 2017-02-11 NOTE — Assessment & Plan Note (Signed)
BP controlled; no change in antihypertensive medications  

## 2017-02-11 NOTE — Progress Notes (Signed)
NURSING HOME LOCATION:  [facility] ROOM NUMBER:  222-B  CODE STATUS:  Full Code  PCP:  Pecola LawlessHopper, Miguelina Fore F, MD  [address] [place] [id number]  This is a nursing facility follow up of chronic medical diagnoses  Interim medical record and care [facility] was updated with review of diagnostic studies and change in clinical status since last visit were documented.  HPI: The patient was seen by the [employer] NP 11/04/16 and no acute changes in condition were noted. Medical issues include essential hypertension, chronic constipation, AAA,urinary retention requiring Foley, and iron deficiency anemia.  Labs are current. He has mild prerenal azotemia with a BUN of 29. Creatinine is normal .There has been a stable anemia 10/17-4/18  With most recent Hgb 11 of 9.8 and hematocrit 29.  Review of systems:  Date given as June 2017. He knew the president's name. He states his urine is red "because of medicine" (on Urostat). He has no other GU planes. He has occasional constipation which responds to medication (Linzess). He categorically denies any other active symptoms.  Constitutional: No fever,significant weight change, fatigue  Eyes: No redness, discharge, pain, vision change ENT/mouth: No nasal congestion,  purulent discharge, earache,change in hearing ,sore throat  Cardiovascular: No chest pain, palpitations,paroxysmal nocturnal dyspnea, claudication, edema  Respiratory: No cough, sputum production,hemoptysis, DOE , significant snoring,apnea  Gastrointestinal: No heartburn,dysphagia,abdominal pain, nausea / vomiting,rectal bleeding, melena Genitourinary: No dysuria,hematuria, pyuria Musculoskeletal: No joint stiffness, joint swelling, weakness,pain Dermatologic: No rash, pruritus, change in appearance of skin Neurologic: No dizziness,headache,syncope, seizures, numbness , tingling Psychiatric: No significant anxiety , depression, insomnia, anorexia Endocrine: No  change in hair/skin/ nails, excessive thirst, excessive hunger, excessive urination  Hematologic/lymphatic: No significant bruising, lymphadenopathy,abnormal bleeding Allergy/immunology: No itchy/ watery eyes, significant sneezing, urticaria, angioedema  Physical exam:  Pertinent or positive findings: the patient is in a wheelchair which he self propels. He appears much younger than his stated age. Pattern alopecia is present. He has a IT consultantgoatee. Arcus senilis is noted. Dental plaque is present. The most striking finding is wasting of the temples, limbs, and hands.  Trace edema is noted. He has decreased pedal pulses. Clubbing of the nailbeds is noted. Dupuytren's contracture suggested of the right palm.  There is relative hyperpigmentation of the forehead. There is a small subcutaneous tissue deficit over the right forehead.   General appearance:Adequately nourished; no acute distress , increased work of breathing is present.   Lymphatic: No lymphadenopathy about the head, neck, axilla . Eyes: No conjunctival inflammation or lid edema is present. There is no scleral icterus. Ears:  External ear exam shows no significant lesions or deformities.   Nose:  External nasal examination shows no deformity or inflammation. Nasal mucosa are pink and moist without lesions ,exudates Oral exam: lips and gums are healthy appearing.There is no oropharyngeal erythema or exudate . Neck:  No thyromegaly, masses, tenderness noted.    Heart:  Normal rate and regular rhythm. S1 and S2 normal without gallop, murmur, click, rub .  Lungs:Chest clear to auscultation without wheezes, rhonchi,rales , rubs. Abdomen:Bowel sounds are normal. Abdomen is soft and nontender with no organomegaly, hernias,masses. GU: deferred ; Foley present Neurologic exam : Strength equal  in upper & lower extremities but decreased Balance,Rhomberg,finger to nose testing could not be completed due to clinical state Skin: Warm & dry w/o  tenting. No significant lesions or rash.  See summary under each active problem in the Problem List with associated updated  therapeutic plan

## 2017-02-11 NOTE — Assessment & Plan Note (Signed)
Maintain blood pressure less than 140/90 at minimum

## 2017-02-11 NOTE — Assessment & Plan Note (Signed)
Excellent response to Linzess; no change indicated

## 2017-02-11 NOTE — Assessment & Plan Note (Signed)
Anemia stable, recheck CBC in 10/18

## 2017-02-11 NOTE — Patient Instructions (Signed)
See assessment and plan under each diagnosis in the problem list and acutely for this visit 

## 2017-02-28 LAB — CBC AND DIFFERENTIAL
HCT: 30 — AB (ref 41–53)
HEMOGLOBIN: 9.9 — AB (ref 13.5–17.5)
Neutrophils Absolute: 4
Platelets: 216 (ref 150–399)
WBC: 6.5

## 2017-03-04 ENCOUNTER — Non-Acute Institutional Stay (SKILLED_NURSING_FACILITY): Payer: Medicare Other

## 2017-03-04 DIAGNOSIS — Z0000 Encounter for general adult medical examination without abnormal findings: Secondary | ICD-10-CM | POA: Diagnosis not present

## 2017-03-04 NOTE — Progress Notes (Signed)
Subjective:   [name] Bennett is a 83 y.o. male who presents for an Initial Medicare Annual Wellness Visit at [facility]   Objective:    Today's Vitals   03/04/17 1434  BP: 118/60  Pulse: 68  Temp: 98.3 F (36.8 C)  TempSrc: Oral  SpO2: 94%  Weight: 139 lb (63 kg)  Height: 6\' 2"  (1.88 m)   Body mass index is 17.85 kg/m.  Current Medications (verified) Outpatient Encounter Prescriptions as of 03/04/2017  Medication Sig  . acetaminophen (TYLENOL) 325 MG tablet Take 650 mg by mouth every 6 (six) hours as needed.  . ALPRAZolam (XANAX) 0.25 MG tablet Take 0.25 mg by mouth daily. Take at 1200  . Amino Acids-Protein Hydrolys (FEEDING SUPPLEMENT, PRO-STAT SUGAR FREE 64,) LIQD Take 30 mLs by mouth 2 (two) times daily.  Marland Kitchen amLODipine (NORVASC) 5 MG tablet Take 5 mg by mouth daily.  . busPIRone (BUSPAR) 7.5 MG tablet Take 7.5 mg by mouth 3 (three) times daily.   . Cholecalciferol (VITAMIN D3) 50000 units TABS Take 1 tablet by mouth every 30 (thirty) days.  . Cranberry-Vitamin C-Inulin (UTI-STAT) LIQD Take 30 mLs by mouth daily. For UTI prophylaxis  . ferrous sulfate 325 (65 FE) MG tablet Take 325 mg by mouth 2 (two) times daily with a meal. Monitor for constipation and report  . hydrochlorothiazide (MICROZIDE) 12.5 MG capsule Take 12.5 mg by mouth daily.  Marland Kitchen linaclotide (LINZESS) 145 MCG CAPS capsule Take 145 mcg by mouth daily before breakfast.  . loratadine (CLARITIN) 10 MG tablet Take 10 mg by mouth daily.  Marland Kitchen omeprazole (PRILOSEC) 20 MG capsule Take 20 mg by mouth 2 (two) times daily before a meal.   . phenazopyridine (URISTAT) 95 MG tablet Take 95 mg by mouth 3 (three) times daily.  . polyethylene glycol (MIRALAX / GLYCOLAX) packet Take 17 g by mouth daily. For constipation  . Sodium Phosphates (FLEET ENEMA RE) Rectally as needed if no BM in 3 days  . Sunscreens (GENERAL PROTECTION SUNSCREEN) LOTN Apply topically. Apply sun screen to affected areas topically as needed  prior to sun exposure  . vitamin B-12 (CYANOCOBALAMIN) 1000 MCG tablet Take 1,000 mcg by mouth daily.  . vitamin C (ASCORBIC ACID) 500 MG tablet Take 500 mg by mouth 2 (two) times daily.   No facility-administered encounter medications on file as of 03/04/2017.     Allergies (verified) Patient has no known allergies.   History: Past Medical History:  Diagnosis Date  . Hypertension   . Urinary retention    Past Surgical History:  Procedure Laterality Date  . FRACTURE SURGERY    . HIP ARTHROPLASTY Left 05/01/2013   Procedure: ARTHROPLASTY BIPOLAR HIP;  Surgeon: Shelda Pal, MD;  Location: [facility];  Service: Orthopedics;  Laterality: Left;  . HIP FRACTURE SURGERY Right 2014   History reviewed. No pertinent family history. Social History   Occupational History  . Not on file.   Social History Main Topics  . Smoking status: Former Smoker    Types: Cigarettes, Pipe, Cigars    Quit date: 11/02/1952  . Smokeless tobacco: Former Neurosurgeon    Types: Snuff, Chew  . Alcohol use Yes     Comment: 11/02/2015 "used alcohol way back in my early years"  . Drug use: No  . Sexual activity: No   Tobacco Counseling Counseling given: Not Answered   Activities of Daily Living In your present state of health, do you have any difficulty performing the following  activities: 03/04/2017  Hearing? N  Vision? N  Difficulty concentrating or making decisions? Y  Walking or climbing stairs? Y  Dressing or bathing? Y  Doing errands, shopping? Y  Preparing Food and eating ? Y  Using the Toilet? Y  In the past six months, have you accidently leaked urine? Y  Do you have problems with loss of bowel control? Y  Managing your Medications? Y  Managing your Finances? Y  Housekeeping or managing your Housekeeping? Y  Some recent data might be hidden    Immunizations and Health Maintenance Immunization History  Administered Date(s) Administered  . Influenza-Unspecified 05/05/2015  . PPD Test 07/17/2013    Health Maintenance Due  Topic Date Due  . PNA vac Low Risk Adult (1 of 2 - PCV13) 10/11/1984    Patient Care Team: Pecola LawlessHopper, William F, MD as PCP - General (Internal Medicine)  Indicate any recent Medical Services you may have received from other than [facility] providers in the past year (date may be approximate).    Assessment:   This is a routine wellness examination for [name] Bennett.   Hearing/Vision screen No exam data present  Dietary issues and exercise activities discussed: Current Exercise Habits: The patient does not participate in regular exercise at present, Exercise limited by: orthopedic condition(s)  Goals    None     Depression Screen PHQ 2/9 Scores 03/04/2017  PHQ - 2 Score 0    Fall Risk Fall Risk  03/04/2017  Falls in the past year? No    Cognitive Function:     6CIT Screen 03/04/2017  What Year? 0 points  What month? 0 points  What time? 0 points  Count back from 20 0 points  Months in reverse 4 points  Repeat phrase 4 points  Total Score 8    Screening Tests Health Maintenance  Topic Date Due  . PNA vac Low Risk Adult (1 of 2 - PCV13) 10/11/1984  . INFLUENZA VACCINE  04/28/2017 (Originally 02/26/2017)  . TETANUS/TDAP  07/30/2023 (Originally 10/12/1936)        Plan:    I have personally reviewed and addressed the Medicare Annual Wellness questionnaire and have noted the following in the patient's chart:  A. Medical and social history B. Use of alcohol, tobacco or illicit drugs  C. Current medications and supplements D. Functional ability and status E.  Nutritional status F.  Physical activity G. Advance directives H. List of other physicians I.  Hospitalizations, surgeries, and ER visits in previous 12 months J.  Vitals K. Screenings to include hearing, vision, cognitive, depression L. Referrals and appointments - none  In addition, I have reviewed and discussed with patient certain preventive protocols, quality metrics, and best practice  recommendations. A written personalized care plan for preventive services as well as general preventive health recommendations were provided to patient.  See attached scanned questionnaire for additional information.   Signed,   Annetta MawSara Gonthier, RN Nurse Health Advisor   Quick Notes   Health Maintenance:  PNA 13 and TDAP due. Hep C panel done in 2002     Abnormal Screen: 6 CIT-8     Patient Concerns: None     Nurse Concerns: None I have personally reviewed the health advisor's clinical note, was available for consultation, and agree with the assessment and plan as written. Pecola LawlessWilliam F Hopper M.D., FACP, [facility]

## 2017-03-04 NOTE — Patient Instructions (Signed)
Mr. [name] Bennett , Thank you for taking time to come for your Medicare Wellness Visit. I appreciate your ongoing commitment to your health goals. Please review the following plan we discussed and let me know if I can assist you in the future.   Screening recommendations/referrals: Colonoscopy excluded, pt over age 81 Recommended yearly ophthalmology/optometry visit for glaucoma screening and checkup Recommended yearly dental visit for hygiene and checkup  Vaccinations: Influenza vaccine due 2018 fall season Pneumococcal vaccine 13 due Tdap vaccine due Shingles vaccine not in records  Advanced directives: Need a copy of living will and health care power of attorney for chart  Conditions/risks identified: None  Next appointment: Dr. Alwyn RenHopper makes rounds  Preventive Care 65 Years and Older, Male Preventive care refers to lifestyle choices and visits with your health care provider that can promote health and wellness. What does preventive care include?  A yearly physical exam. This is also called an annual well check.  Dental exams once or twice a year.  Routine eye exams. Ask your health care provider how often you should have your eyes checked.  Personal lifestyle choices, including:  Daily care of your teeth and gums.  Regular physical activity.  Eating a healthy diet.  Avoiding tobacco and drug use.  Limiting alcohol use.  Practicing safe sex.  Taking low doses of aspirin every day.  Taking vitamin and mineral supplements as recommended by your health care provider. What happens during an annual well check? The services and screenings done by your health care provider during your annual well check will depend on your age, overall health, lifestyle risk factors, and family history of disease. Counseling  Your health care provider may ask you questions about your:  Alcohol use.  Tobacco use.  Drug use.  Emotional well-being.  Home and relationship  well-being.  Sexual activity.  Eating habits.  History of falls.  Memory and ability to understand (cognition).  Work and work Astronomerenvironment. Screening  You may have the following tests or measurements:  Height, weight, and BMI.  Blood pressure.  Lipid and cholesterol levels. These may be checked every 5 years, or more frequently if you are over 81 years old.  Skin check.  Lung cancer screening. You may have this screening every year starting at age 81 if you have a 30-pack-year history of smoking and currently smoke or have quit within the past 15 years.  Fecal occult blood test (FOBT) of the stool. You may have this test every year starting at age 81.  Flexible sigmoidoscopy or colonoscopy. You may have a sigmoidoscopy every 5 years or a colonoscopy every 10 years starting at age 81.  Prostate cancer screening. Recommendations will vary depending on your family history and other risks.  Hepatitis C blood test.  Hepatitis B blood test.  Sexually transmitted disease (STD) testing.  Diabetes screening. This is done by checking your blood sugar (glucose) after you have not eaten for a while (fasting). You may have this done every 1-3 years.  Abdominal aortic aneurysm (AAA) screening. You may need this if you are a current or former smoker.  Osteoporosis. You may be screened starting at age 81 if you are at high risk. Talk with your health care provider about your test results, treatment options, and if necessary, the need for more tests. Vaccines  Your health care provider may recommend certain vaccines, such as:  Influenza vaccine. This is recommended every year.  Tetanus, diphtheria, and acellular pertussis (Tdap, Td) vaccine. You may need  a Td booster every 10 years.  Zoster vaccine. You may need this after age 34.  Pneumococcal 13-valent conjugate (PCV13) vaccine. One dose is recommended after age 27.  Pneumococcal polysaccharide (PPSV23) vaccine. One dose is  recommended after age 56. Talk to your health care provider about which screenings and vaccines you need and how often you need them. This information is not intended to replace advice given to you by your health care provider. Make sure you discuss any questions you have with your health care provider. Document Released: 08/11/2015 Document Revised: 04/03/2016 Document Reviewed: 05/16/2015 Elsevier Interactive Patient Education  2017 [place] Prevention in the Home Falls can cause injuries. They can happen to people of all ages. There are many things you can do to make your home safe and to help prevent falls. What can I do on the outside of my home?  Regularly fix the edges of walkways and driveways and fix any cracks.  Remove anything that might make you trip as you walk through a door, such as a raised step or threshold.  Trim any bushes or trees on the path to your home.  Use bright outdoor lighting.  Clear any walking paths of anything that might make someone trip, such as rocks or tools.  Regularly check to see if handrails are loose or broken. Make sure that both sides of any steps have handrails.  Any raised decks and porches should have guardrails on the edges.  Have any leaves, snow, or ice cleared regularly.  Use sand or salt on walking paths during winter.  Clean up any spills in your garage right away. This includes oil or grease spills. What can I do in the bathroom?  Use night lights.  Install grab bars by the toilet and in the tub and shower. Do not use towel bars as grab bars.  Use non-skid mats or decals in the tub or shower.  If you need to sit down in the shower, use a plastic, non-slip stool.  Keep the floor dry. Clean up any water that spills on the floor as soon as it happens.  Remove soap buildup in the tub or shower regularly.  Attach bath mats securely with double-sided non-slip rug tape.  Do not have throw rugs and other things on  the floor that can make you trip. What can I do in the bedroom?  Use night lights.  Make sure that you have a light by your bed that is easy to reach.  Do not use any sheets or blankets that are too big for your bed. They should not hang down onto the floor.  Have a firm chair that has side arms. You can use this for support while you get dressed.  Do not have throw rugs and other things on the floor that can make you trip. What can I do in the kitchen?  Clean up any spills right away.  Avoid walking on wet floors.  Keep items that you use a lot in easy-to-reach places.  If you need to reach something above you, use a strong step stool that has a grab bar.  Keep electrical cords out of the way.  Do not use floor polish or wax that makes floors slippery. If you must use wax, use non-skid floor wax.  Do not have throw rugs and other things on the floor that can make you trip. What can I do with my stairs?  Do not leave any items on the  stairs.  Make sure that there are handrails on both sides of the stairs and use them. Fix handrails that are broken or loose. Make sure that handrails are as long as the stairways.  Check any carpeting to make sure that it is firmly attached to the stairs. Fix any carpet that is loose or worn.  Avoid having throw rugs at the top or bottom of the stairs. If you do have throw rugs, attach them to the floor with carpet tape.  Make sure that you have a light switch at the top of the stairs and the bottom of the stairs. If you do not have them, ask someone to add them for you. What else can I do to help prevent falls?  Wear shoes that:  Do not have high heels.  Have rubber bottoms.  Are comfortable and fit you well.  Are closed at the toe. Do not wear sandals.  If you use a stepladder:  Make sure that it is fully opened. Do not climb a closed stepladder.  Make sure that both sides of the stepladder are locked into place.  Ask someone to  hold it for you, if possible.  Clearly mark and make sure that you can see:  Any grab bars or handrails.  First and last steps.  Where the edge of each step is.  Use tools that help you move around (mobility aids) if they are needed. These include:  Canes.  Walkers.  Scooters.  Crutches.  Turn on the lights when you go into a dark area. Replace any light bulbs as soon as they burn out.  Set up your furniture so you have a clear path. Avoid moving your furniture around.  If any of your floors are uneven, fix them.  If there are any pets around you, be aware of where they are.  Review your medicines with your doctor. Some medicines can make you feel dizzy. This can increase your chance of falling. Ask your doctor what other things that you can do to help prevent falls. This information is not intended to replace advice given to you by your health care provider. Make sure you discuss any questions you have with your health care provider. Document Released: 05/11/2009 Document Revised: 12/21/2015 Document Reviewed: 08/19/2014 Elsevier Interactive Patient Education  2017 [employer].

## 2017-04-08 LAB — BASIC METABOLIC PANEL
BUN: 35 — AB (ref 4–21)
CREATININE: 1.3 (ref 0.6–1.3)
GLUCOSE: 109
POTASSIUM: 4 (ref 3.4–5.3)
SODIUM: 139 (ref 137–147)

## 2017-04-08 LAB — CBC AND DIFFERENTIAL
HCT: 28 — AB (ref 41–53)
Hemoglobin: 9 — AB (ref 13.5–17.5)
Neutrophils Absolute: 7
Platelets: 281 (ref 150–399)
WBC: 9.1

## 2017-04-24 ENCOUNTER — Encounter: Payer: Self-pay | Admitting: Internal Medicine

## 2017-04-24 DIAGNOSIS — B9689 Other specified bacterial agents as the cause of diseases classified elsewhere: Secondary | ICD-10-CM | POA: Insufficient documentation

## 2017-04-24 DIAGNOSIS — N390 Urinary tract infection, site not specified: Secondary | ICD-10-CM | POA: Insufficient documentation

## 2017-04-24 DIAGNOSIS — B961 Klebsiella pneumoniae [K. pneumoniae] as the cause of diseases classified elsewhere: Secondary | ICD-10-CM

## 2017-05-20 ENCOUNTER — Encounter: Payer: Self-pay | Admitting: Internal Medicine

## 2017-05-22 ENCOUNTER — Encounter: Payer: Self-pay | Admitting: Internal Medicine

## 2017-05-22 NOTE — Progress Notes (Signed)
    NURSING HOME LOCATION:  [facility] ROOM NUMBER:  [id number]  CODE STATUS:  Full Code  PCP:  Pecola LawlessHopper, William F, MD  [address] [place] [id number]  This is a nursing facility follow up for specific acute issue of    of chronic medical diagnoses  Nursing Facility readmission within 30 days  Interim medical record and care [facility] was updated with review of diagnostic studies and change in clinical status since last visit were documented.  HPI:  Review of systems: Dementia invalidated responses. Date given as   Constitutional: No fever,significant weight change, fatigue  Eyes: No redness, discharge, pain, vision change ENT/mouth: No nasal congestion,  purulent discharge, earache,change in hearing ,sore throat  Cardiovascular: No chest pain, palpitations,paroxysmal nocturnal dyspnea, claudication, edema  Respiratory: No cough, sputum production,hemoptysis, DOE , significant snoring,apnea  Gastrointestinal: No heartburn,dysphagia,abdominal pain, nausea / vomiting,rectal bleeding, melena,change in bowels Genitourinary: No dysuria,hematuria, pyuria,  incontinence, nocturia Musculoskeletal: No joint stiffness, joint swelling, weakness,pain Dermatologic: No rash, pruritus, change in appearance of skin Neurologic: No dizziness,headache,syncope, seizures, numbness , tingling Psychiatric: No significant anxiety , depression, insomnia, anorexia Endocrine: No change in hair/skin/ nails, excessive thirst, excessive hunger, excessive urination  Hematologic/lymphatic: No significant bruising, lymphadenopathy,abnormal bleeding Allergy/immunology: No itchy/ watery eyes, significant sneezing, urticaria, angioedema  Physical exam:  Pertinent or positive findings: General appearance:Adequately nourished; no acute distress , increased work of breathing is present.   Lymphatic: No lymphadenopathy about the head, neck, axilla . Eyes: No conjunctival inflammation or  lid edema is present. There is no scleral icterus. Ears:  External ear exam shows no significant lesions or deformities.   Nose:  External nasal examination shows no deformity or inflammation. Nasal mucosa are pink and moist without lesions ,exudates Oral exam: lips and gums are healthy appearing.There is no oropharyngeal erythema or exudate . Neck:  No thyromegaly, masses, tenderness noted.    Heart:  Normal rate and regular rhythm. S1 and S2 normal without gallop, murmur, click, rub .  Lungs:Chest clear to auscultation without wheezes, rhonchi,rales , rubs. Abdomen:Bowel sounds are normal. Abdomen is soft and nontender with no organomegaly, hernias,masses. GU: deferred  Extremities:  No cyanosis, clubbing,edema  Neurologic exam : Cn 2-7 intact Strength equal  in upper & lower extremities Balance,Rhomberg,finger to nose testing could not be completed due to clinical state Deep tendon reflexes are equal Skin: Warm & dry w/o tenting. No significant lesions or rash.  See summary under each active problem in the Problem List with associated updated therapeutic plan      This encounter was created in error - please disregard.

## 2017-06-26 ENCOUNTER — Encounter: Payer: Self-pay | Admitting: Internal Medicine

## 2017-06-26 ENCOUNTER — Non-Acute Institutional Stay (SKILLED_NURSING_FACILITY): Payer: Medicare Other | Admitting: Internal Medicine

## 2017-06-26 DIAGNOSIS — D509 Iron deficiency anemia, unspecified: Secondary | ICD-10-CM | POA: Diagnosis not present

## 2017-06-26 DIAGNOSIS — R7989 Other specified abnormal findings of blood chemistry: Secondary | ICD-10-CM | POA: Insufficient documentation

## 2017-06-26 DIAGNOSIS — K5909 Other constipation: Secondary | ICD-10-CM | POA: Diagnosis not present

## 2017-06-26 DIAGNOSIS — N182 Chronic kidney disease, stage 2 (mild): Secondary | ICD-10-CM | POA: Diagnosis not present

## 2017-06-26 NOTE — Assessment & Plan Note (Addendum)
Repeat BMET because of slight rise in creatinine on serial labs

## 2017-06-26 NOTE — Progress Notes (Signed)
NURSING HOME LOCATION:  [facility] ROOM NUMBER:  [id number]  CODE STATUS:  DNR  PCP:  Pecola LawlessHopper, Kammy Klett F, MD  [address] [place] [id number]  This is a nursing facility follow up of chronic medical diagnoses  Interim medical record and care [facility] was updated with review of diagnostic studies and change in clinical status since last visit were documented.  HPI: The patient is a permanent resident of this facility. He has medical diagnoses of abdominal aortic aneurysm without rupture, essential hypertension, chronic constipation, DJD of the knees, chronic kidney disease stage II, urinary retention due to prostatic hypertrophy with indwelling Foley, recurrent UTIs, and iron deficiency anemia, protein-caloric malnutrition -severe. He is on a bowel regimen as well as Linzess for the chronic constipation. For the chronic urinary retention he is on Uristat & also on a cranberry/vitamin C supplement prophylactically for UTIs. For his chronic anemia is on iron sulfate and vitamin C twice a day to enhance absorption. Last labs were 9/11. At that time there had been a rise in his creatinine from 0.9 up to 1.3. Also at that time his hemoglobin and hematocrit dropped from 9.9/30 down to 9/28. In reference to constipation, his last TSH was 0.88 on 08/19/16.  Review of systems: He is alert & essentially oriented. He gave the date as Thursday, November 2018. He states does not keep up with the day of the month. He also struggled to name the president. He denies any bleeding dyscrasias at this time. He describes occasional constipation but states the present regimen is working well. He does deny dysphagia even trying to swallow a handful of pills.  Constitutional: No fever,significant weight change, fatigue  Eyes: No redness, discharge, pain, vision change ENT/mouth: No nasal congestion,  purulent discharge, earache,change in hearing ,sore throat  Cardiovascular: No  chest pain, palpitations,paroxysmal nocturnal dyspnea, claudication, edema  Respiratory: No cough, sputum production,hemoptysis, DOE , significant snoring,apnea  Gastrointestinal: No heartburn,dysphagia,abdominal pain, nausea / vomiting,rectal bleeding, melena Genitourinary: No dysuria,hematuria, pyuria,  incontinence, nocturia Musculoskeletal: No joint stiffness, joint swelling, weakness,pain Dermatologic: No rash, pruritus, change in appearance of skin Neurologic: No dizziness,headache,syncope, seizures, numbness , tingling Psychiatric: No significant anxiety , depression, insomnia, anorexia Endocrine: No change in hair/skin/ nails, excessive thirst, excessive hunger, excessive urination  Hematologic/lymphatic: No significant bruising, lymphadenopathy,abnormal bleeding Allergy/immunology: No itchy/ watery eyes, significant sneezing, urticaria, angioedema  Physical exam:  Pertinent or positive findings: He appears younger than his stated age. He has pattern alopecia. Also has a goatee. There is temporal wasting visible. He is missing the left upper anterior incisor. There is respiratory variation to the heart rhythm. There is intermittent splitting of the first heart sound. Chest surprisingly clear. Pedal pulses are decreased. He has atrophy of the limbs. There also appears to be some atrophy of the upper back muscles. There is irregular hyperpigmentation of the left preauricular area.   General appearance:Adequately nourished; no acute distress , increased work of breathing is present.   Lymphatic: No lymphadenopathy about the head, neck, axilla . Eyes: No conjunctival inflammation or lid edema is present. There is no scleral icterus. Ears:  External ear exam shows no significant lesions or deformities.   Nose:  External nasal examination shows no deformity or inflammation. Nasal mucosa are pink and moist without lesions ,exudates Oral exam: lips and gums are healthy appearing.There is no  oropharyngeal erythema or exudate . Neck:  No thyromegaly, masses, tenderness noted.    Heart:  No  gallop, murmur, click, rub .  Lungs: without wheezes, rhonchi,rales , rubs. Abdomen:Bowel sounds are normal. Abdomen is soft and nontender with no organomegaly, hernias,masses. GU: deferred  Extremities:  No cyanosis, clubbing,edema  Neurologic exam : Strength equal  in upper & lower extremities Balance,Rhomberg,finger to nose testing could not be completed due to clinical state Skin: Warm & dry w/o tenting. No significant lesions or rash.  See summary under each active problem in the Problem List with associated updated therapeutic plan

## 2017-06-26 NOTE — Assessment & Plan Note (Signed)
04/08/17 hemoglobin dropped from 9.9 down to 9. Despite iron twice a day with supplemental vitamin C for absorption Recheck CBC

## 2017-06-26 NOTE — Assessment & Plan Note (Signed)
-   recheck TSH

## 2017-06-26 NOTE — Assessment & Plan Note (Addendum)
Check TSH Continue present bowel regimen

## 2017-06-26 NOTE — Patient Instructions (Signed)
See assessment and plan under each diagnosis in the problem list and acutely for this visit 

## 2017-06-27 ENCOUNTER — Encounter: Payer: Self-pay | Admitting: Internal Medicine

## 2017-08-15 LAB — BASIC METABOLIC PANEL
BUN: 34 — AB (ref 4–21)
Creatinine: 1 (ref 0.6–1.3)
Glucose: 84
Potassium: 4.3 (ref 3.4–5.3)
Sodium: 140 (ref 137–147)

## 2017-08-15 LAB — CBC AND DIFFERENTIAL
HEMATOCRIT: 27 — AB (ref 41–53)
HEMOGLOBIN: 9.2 — AB (ref 13.5–17.5)
Neutrophils Absolute: 3
Platelets: 212 (ref 150–399)
WBC: 5.7

## 2017-08-15 LAB — IRON,TIBC AND FERRITIN PANEL
%SAT: 42.46
Iron: 72
TIBC: 171
UIBC: 98

## 2017-08-15 LAB — TSH: TSH: 0.89 (ref 0.41–5.90)

## 2017-08-15 LAB — HEPATIC FUNCTION PANEL
ALK PHOS: 67 (ref 25–125)
ALT: 7 — AB (ref 10–40)
AST: 15 (ref 14–40)
Bilirubin, Total: 0.5

## 2017-08-15 LAB — VITAMIN B12: Vitamin B-12: 989

## 2017-08-15 LAB — VITAMIN D 25 HYDROXY (VIT D DEFICIENCY, FRACTURES): VIT D 25 HYDROXY: 51.66

## 2017-10-16 ENCOUNTER — Non-Acute Institutional Stay (SKILLED_NURSING_FACILITY): Payer: Medicare Other | Admitting: Internal Medicine

## 2017-10-16 ENCOUNTER — Encounter: Payer: Self-pay | Admitting: Internal Medicine

## 2017-10-16 DIAGNOSIS — I714 Abdominal aortic aneurysm, without rupture, unspecified: Secondary | ICD-10-CM

## 2017-10-16 DIAGNOSIS — D509 Iron deficiency anemia, unspecified: Secondary | ICD-10-CM | POA: Diagnosis not present

## 2017-10-16 DIAGNOSIS — I10 Essential (primary) hypertension: Secondary | ICD-10-CM

## 2017-10-16 NOTE — Assessment & Plan Note (Signed)
Focus should be on maintaining normal blood pressure

## 2017-10-16 NOTE — Assessment & Plan Note (Addendum)
Present supplement is adequate based on current iron level in January

## 2017-10-16 NOTE — Assessment & Plan Note (Signed)
BP controlled; no change in antihypertensive medications If blood pressure is consistently over 140/90, beta blocker will be added because of history of AAA

## 2017-10-16 NOTE — Progress Notes (Signed)
NURSING HOME LOCATION:  [facility] ROOM NUMBER:  [id number]  CODE STATUS:  DNR  PCP:  Pecola Lawless, MD  [address] [place] [address]  This is a nursing facility follow up of chronic medical diagnoses  Interim medical record and care since last [facility] visit was updated with review of diagnostic studies and change in clinical status since last visit were documented.  HPI: He is a permanent resident of this facility with diagnoses of abdominal aortic aneurysm without rupture, essential hypertension, chronic constipation, osteoarthritis, CKD stage II, BPH with urinary retention necessitating indwelling Foley, recurrent UTIs, & protein-caloric malnutrition severe. He has on a bowel regime for his chronic constipation. He is on supplements to prevent urinary tract infections. He is also on iron supplementation for chronic anemia. His labs are sufficiently current. On 08/15/17 he exhibited prerenal ischemia with a BUN of 34 and creatinine of 1. ALT was reduced to 7. His anemia was essentially stable with hemoglobin 9.2 hematocrit 27. The last iron level on record was 72 in January; it had been 20 on 04/28/13. TSH was low normal, but stable over past year. .  Review of systems: He is alert and oriented. He states his only complaint is delayed response when he calls for pain related to the indwelling Foley catheter.Tylenol is adequate for pain relief. He notes that the urine is reddish but relates this to the supplements he is prescribed. He denies hematuria.  Constitutional: No fever, significant weight change, fatigue  Eyes: No redness, discharge, pain, vision change ENT/mouth: No nasal congestion,  purulent discharge, earache, change in hearing, sore throat  Cardiovascular: No chest pain, palpitations, paroxysmal nocturnal dyspnea, claudication, edema  Respiratory: No cough, sputum production, hemoptysis, DOE , significant snoring, apnea   Gastrointestinal: No heartburn,  dysphagia, abdominal pain, nausea /vomiting, rectal bleeding, melena, change in bowels Genitourinary: No dysuria, hematuria, pyuria Musculoskeletal: No joint stiffness, joint swelling, weakness, pain Dermatologic: No rash, pruritus, change in appearance of skin Neurologic: No dizziness, headache, syncope, seizures, numbness, tingling Psychiatric: No significant anxiety, depression, insomnia, anorexia Endocrine: No change in hair/skin/ nails, excessive thirst, excessive hunger, excessive urination  Hematologic/lymphatic: No significant bruising, lymphadenopathy, abnormal bleeding Allergy/immunology: No itchy/watery eyes, significant sneezing, urticaria, angioedema  Physical exam:  Pertinent or positive findings: He has some facial and temporal wasting. Pattern alopecia is present. He has a goatee without a mustache. Arcus senilis is present. He is slightly hard of hearing. The lacrimal glands are prominent; he has an upper anterior silver tooth. Breath sounds are decreased. Foley catheter is in place. Urine is amber. He has interosseous wasting of the hands. His limbs are thin but he is strong to opposition. Pedal pulses are decreased  General appearance: no acute distress, increased work of breathing is present.   Lymphatic: No lymphadenopathy about the head, neck, axilla. Eyes: No conjunctival inflammation or lid edema is present. There is no scleral icterus. Ears:  External ear exam shows no significant lesions or deformities.   Nose:  External nasal examination shows no deformity or inflammation. Nasal mucosa are pink and moist without lesions, exudates Oral exam:  Lips and gums are healthy appearing. There is no oropharyngeal erythema or exudate. Neck:  No thyromegaly, masses, tenderness noted.    Heart:  Normal rate and regular rhythm. S1 and S2 normal without gallop, murmur, click, rub .  Lungs: without wheezes, rhonchi,rales , rubs. Abdomen:Bowel sounds are normal. Abdomen is soft and  nontender with no organomegaly, hernias,masses. GU:  deferred  Extremities:  No cyanosis, clubbing,edema  Neurologic exam : Cn 2-7 intact Strength equal  in upper & lower extremities Balance,Rhomberg,finger to nose testing could not be completed due to clinical state Deep tendon reflexes are equal Skin: Warm & dry w/o tenting. No significant lesions or rash.  See summary under each active problem in the Problem List with associated updated therapeutic plan

## 2017-10-17 NOTE — Patient Instructions (Signed)
See assessment and plan under each diagnosis in the problem list and acutely for this visit 

## 2017-11-14 LAB — BASIC METABOLIC PANEL
BUN: 39 — AB (ref 4–21)
Creatinine: 1.1 (ref 0.6–1.3)
GLUCOSE: 88
Potassium: 3.9 (ref 3.4–5.3)
Sodium: 136 — AB (ref 137–147)

## 2017-11-14 LAB — CBC AND DIFFERENTIAL
HEMATOCRIT: 29 — AB (ref 41–53)
Hemoglobin: 9.5 — AB (ref 13.5–17.5)
NEUTROS ABS: 6
PLATELETS: 366 (ref 150–399)
WBC: 8.2

## 2017-11-14 LAB — VITAMIN B12: Vitamin B-12: 2000

## 2017-11-14 LAB — TSH: TSH: 1.1 (ref 0.41–5.90)

## 2017-11-18 ENCOUNTER — Emergency Department (HOSPITAL_COMMUNITY)
Admission: EM | Admit: 2017-11-18 | Discharge: 2017-11-18 | Disposition: A | Discharge status: Home / Self Care | Payer: Medicare Other | Attending: Emergency Medicine | Admitting: Emergency Medicine

## 2017-11-18 DIAGNOSIS — Z711 Person with feared health complaint in whom no diagnosis is made: Secondary | ICD-10-CM | POA: Diagnosis not present

## 2017-11-18 DIAGNOSIS — K5900 Constipation, unspecified: Secondary | ICD-10-CM | POA: Insufficient documentation

## 2017-11-18 DIAGNOSIS — I129 Hypertensive chronic kidney disease with stage 1 through stage 4 chronic kidney disease, or unspecified chronic kidney disease: Secondary | ICD-10-CM | POA: Insufficient documentation

## 2017-11-18 DIAGNOSIS — Z79899 Other long term (current) drug therapy: Secondary | ICD-10-CM | POA: Diagnosis not present

## 2017-11-18 DIAGNOSIS — Z87891 Personal history of nicotine dependence: Secondary | ICD-10-CM | POA: Insufficient documentation

## 2017-11-18 DIAGNOSIS — N182 Chronic kidney disease, stage 2 (mild): Secondary | ICD-10-CM | POA: Diagnosis not present

## 2017-11-18 NOTE — ED Provider Notes (Signed)
[facility]-EMERGENCY DEPT Provider Note  CSN: [id number] Arrival date & time: 11/18/17 0940  Chief Complaint(s) Constipation  HPI [name] Bennett is a 84 y.o. male   The history is provided by the patient.  Constipation   This is a chronic problem. The current episode started yesterday. The stool is described as formed. Associated symptoms include flatus. Pertinent negatives include no abdominal pain. He does not exercise regularly. There has been adequate water intake. He has tried osmotic agents for the symptoms.    Pt's facility NP was present and states that he is always focused on his BMs. States he last had a BM yesterday. Currently on Rocephin for possible UTI. UA pending at facility.  Pt denies other complaints.  Past Medical History Past Medical History:  Diagnosis Date  . Hypertension   . Urinary retention    Patient Active Problem List   Diagnosis Date Noted  . Low TSH level 06/26/2017  . UTI due to Klebsiella species 04/24/2017  . Rhinitis, allergic 11/30/2015  . Iron deficiency anemia 11/30/2015  . Urinary retention 11/30/2015  . AAA (abdominal aortic aneurysm) without rupture (HCC) 11/05/2015  . Acute renal failure (ARF) (HCC) 11/03/2015  . CKD (chronic kidney disease) stage 2, GFR 60-89 ml/min 08/28/2014  . Chronic indwelling Foley catheter 07/12/2013  . Protein-calorie malnutrition, severe (HCC) 04/30/2013  . Weakness generalized 04/28/2013  . Hematuria 09/15/2011  . UTI (lower urinary tract infection) 09/15/2011  . Chronic constipation 09/15/2011  . Prostatic hypertrophy, benign, with obstruction 09/15/2011  . WEIGHT LOSS 07/27/2009  . HIP FRACTURE, RIGHT 01/22/2008  . Essential hypertension, benign 10/03/2007  . DEGENERATIVE JOINT DISEASE, KNEES, BILATERAL 10/03/2007   Home Medication(s) Prior to Admission medications   Medication Sig Start Date End Date Taking? Authorizing Provider  acetaminophen (TYLENOL) 325 MG tablet Take  650 mg by mouth every 6 (six) hours as needed.    [provider]  ALPRAZolam Prudy Feeler) 0.25 MG tablet Take 0.25 mg by mouth daily. Take at 1200    [provider]  Amino Acids-Protein Hydrolys (FEEDING SUPPLEMENT, PRO-STAT SUGAR FREE 64,) LIQD Take 30 mLs by mouth 2 (two) times daily.    [provider]  amLODipine (NORVASC) 5 MG tablet Take 5 mg by mouth daily.     [provider]  busPIRone (BUSPAR) 7.5 MG tablet Take 7.5 mg by mouth 3 (three) times daily.     [provider]  Cholecalciferol (VITAMIN D3) 50000 units TABS Take 1 tablet by mouth every 30 (thirty) days.    [provider]  Cranberry-Vitamin C-Inulin (UTI-STAT) LIQD Take 30 mLs by mouth daily. For UTI prophylaxis    [provider]  ferrous sulfate 325 (65 FE) MG tablet Take 325 mg by mouth 2 (two) times daily with a meal. Monitor for constipation and report    [provider]  hydrochlorothiazide (MICROZIDE) 12.5 MG capsule Take 12.5 mg by mouth daily.    [provider]  linaclotide (LINZESS) 145 MCG CAPS capsule Take 145 mcg by mouth daily before breakfast.    [provider]  loratadine (CLARITIN) 10 MG tablet Take 10 mg by mouth daily.    [provider]  omeprazole (PRILOSEC) 20 MG capsule Take 20 mg by mouth 2 (two) times daily before a meal.     [provider]  phenazopyridine (URISTAT) 95 MG tablet Take 95 mg by mouth 3 (three) times daily.    [provider]  polyethylene glycol (MIRALAX / GLYCOLAX)  packet Take 17 g by mouth daily. For constipation 05/03/13   Norins, Rosalyn Gess, MD  Sodium Phosphates (FLEET ENEMA RE) Rectally as needed if no BM in 3 days    [provider]  Sunscreens (GENERAL PROTECTION SUNSCREEN) LOTN Apply topically. Apply sun screen to affected areas topically as needed prior to sun exposure    [provider]  vitamin B-12 (CYANOCOBALAMIN) 1000 MCG tablet Take 1,000 mcg by  mouth daily.    [provider]  vitamin C (ASCORBIC ACID) 500 MG tablet Take 500 mg by mouth 2 (two) times daily.    [provider]                                                                                                                                    Past Surgical History Past Surgical History:  Procedure Laterality Date  . FRACTURE SURGERY    . HIP ARTHROPLASTY Left 05/01/2013   Procedure: ARTHROPLASTY BIPOLAR HIP;  Surgeon: Shelda Pal, MD;  Location: [facility];  Service: Orthopedics;  Laterality: Left;  . HIP FRACTURE SURGERY Right 2014   Family History No family history on file.  Social History Social History   Tobacco Use  . Smoking status: Former Smoker    Types: Cigarettes, Pipe, Cigars    Last attempt to quit: 11/02/1952    Years since quitting: 65.0  . Smokeless tobacco: Former Neurosurgeon    Types: Snuff, Chew  Substance Use Topics  . Alcohol use: Yes    Comment: 11/02/2015 "used alcohol way back in my early years"  . Drug use: No   Allergies Patient has no known allergies.  Review of Systems Review of Systems  Gastrointestinal: Positive for constipation and flatus. Negative for abdominal pain.   All other systems are reviewed and are negative for acute change except as noted in the HPI  Physical Exam Vital Signs  I have reviewed the triage vital signs BP 121/71 (BP Location: Left Arm)   Pulse 70   Temp 98.5 F (36.9 C) (Oral)   Resp 20   SpO2 95%   Physical Exam  Constitutional: He is oriented to person, place, and time. He appears well-developed and well-nourished. No distress.  HENT:  Head: Normocephalic and atraumatic.  Right Ear: External ear normal.  Left Ear: External ear normal.  Nose: Nose normal.  Mouth/Throat: Mucous membranes are normal. No trismus in the jaw.  Eyes: Conjunctivae and EOM are normal. No scleral icterus.  Neck: Normal range of motion and phonation normal.  Cardiovascular: Normal rate and regular rhythm.   Pulmonary/Chest: Effort normal. No stridor. No respiratory distress.  Abdominal: He exhibits no distension. There is no tenderness. There is no rigidity, no rebound and no guarding.  Genitourinary: Rectum normal.  Genitourinary Comments: No stool in the rectal vault. Indwelling Foley in place.   Musculoskeletal: Normal range of motion. He exhibits no edema.  Neurological: He is  alert and oriented to person, place, and time.  Skin: He is not diaphoretic.  Psychiatric: He has a normal mood and affect. His behavior is normal.  Vitals reviewed.   ED Results and Treatments Labs (all labs ordered are listed, but only abnormal results are displayed) Labs Reviewed - No data to display                                                                                                                       EKG  EKG Interpretation  Date/Time:    Ventricular Rate:    PR Interval:    QRS Duration:   QT Interval:    QTC Calculation:   R Axis:     Text Interpretation:        Radiology No results found. Pertinent labs & imaging results that were available during my care of the patient were reviewed by me and considered in my medical decision making (see chart for details).  Medications Ordered in ED Medications - No data to display                                                                                                                                  Procedures Procedures  (including critical care time)  Medical Decision Making / ED Course I have reviewed the nursing notes for this encounter and the patient's prior records (if available in EHR or on provided paperwork).    No evidence of impaction. At baseline. No oral intolerance.   The patient appears reasonably screened and/or stabilized for discharge and I doubt any other medical condition or other [facility] requiring further screening, evaluation, or treatment in the ED at this time prior to discharge.  The patient is safe for  discharge with strict return precautions.   Final Clinical Impression(s) / ED Diagnoses Final diagnoses:  Worried well    Disposition: Discharge  Condition: Good  I have discussed the results, Dx and Tx plan with the patient who expressed understanding and agree(s) with the plan. Discharge instructions discussed at great length. The patient was given strict return precautions who verbalized understanding of the instructions. No further questions at time of discharge.    ED Discharge Orders    None       Follow Up: Pecola LawlessHopper, William F, MD [address] [place] [id number] [phone]  Schedule an appointment as soon as possible for a visit  As  needed     This chart was dictated using voice recognition software.  Despite best efforts to proofread,  errors can occur which can change the documentation meaning.   Nira Conn, MD 11/18/17 [phone]

## 2017-11-18 NOTE — ED Notes (Signed)
Bed: WA20 Expected date:  Expected time:  Means of arrival:  Comments: EMS-constipated

## 2017-11-18 NOTE — ED Triage Notes (Signed)
Per EMS, pt is coming [facility] with complaints of constipation. EMS reports that pt was recently diagnosed with pneumonia and has since complained of constipation. EMS states that pt reports last bowel movement being 1 day ago. Pt had been asking staff for a suppository. Per EMS pt is AO x4 and uses a wheelchair. Pt has hx of hypertension and a chronic foley in place.

## 2017-12-17 ENCOUNTER — Non-Acute Institutional Stay (SKILLED_NURSING_FACILITY): Payer: Medicare Other | Admitting: Adult Health

## 2017-12-17 ENCOUNTER — Encounter: Payer: Self-pay | Admitting: Adult Health

## 2017-12-17 DIAGNOSIS — R1310 Dysphagia, unspecified: Secondary | ICD-10-CM | POA: Diagnosis not present

## 2017-12-17 DIAGNOSIS — K5909 Other constipation: Secondary | ICD-10-CM

## 2017-12-17 DIAGNOSIS — E43 Unspecified severe protein-calorie malnutrition: Secondary | ICD-10-CM

## 2017-12-17 DIAGNOSIS — F419 Anxiety disorder, unspecified: Secondary | ICD-10-CM

## 2017-12-17 DIAGNOSIS — G8929 Other chronic pain: Secondary | ICD-10-CM

## 2017-12-17 DIAGNOSIS — N182 Chronic kidney disease, stage 2 (mild): Secondary | ICD-10-CM | POA: Diagnosis not present

## 2017-12-17 DIAGNOSIS — Z960 Presence of urogenital implants: Secondary | ICD-10-CM

## 2017-12-17 DIAGNOSIS — I10 Essential (primary) hypertension: Secondary | ICD-10-CM

## 2017-12-17 DIAGNOSIS — Z978 Presence of other specified devices: Secondary | ICD-10-CM

## 2017-12-17 NOTE — Progress Notes (Addendum)
Location:  [facility] Room Number: 222-B Place of Service:  SNF (31) Provider:  Kenard Gower, NP  Patient Care Team: Pecola Lawless, MD as PCP - General (Internal Medicine)  Extended Emergency Contact Information Primary Emergency Contact: Beaulah Dinning [place] Home Phone: [phone] Relation: Niece Secondary Emergency Contact: Mariane Masters [place] Home Phone: [phone] Relation: Niece  Code Status:  DNR  Goals of care: Advanced Directive information Advanced Directives 12/17/2017  Does Patient Have a Medical Advance Directive? Yes  Type of Advance Directive Out of facility DNR (pink MOST or yellow form)  Does patient want to make changes to medical advance directive? No - Patient declined  Copy of Healthcare Power of Attorney in Chart? -  Would patient like information on creating a medical advance directive? -  Pre-existing out of facility DNR order (yellow form or pink MOST form) -     Chief Complaint  Patient presents with  . Medical Management of Chronic Issues    Routine [facility] visit    HPI:  Pt is a 83 y.o. male seen today for medical management of chronic diseases.  He is a long-term care resident of [facility]. He is also followed by [facility].  He has a PMH of AAA, chronic indwelling Foley catheter, chronic constipation, essential hypertension, and DJD bilateral knees. He was seen today in his room. He is verbally responsive but incomprehensible. He has been refusing his medications according to hospice nurse.  He has poor intake due to severe dysphagia. He has sacral wound and new wounds noted on right heel, left heel and lateral foot. He has been reported to be complaining of pain.   Past Medical History:  Diagnosis Date  . Hypertension   . Urinary retention    Past Surgical History:  Procedure Laterality Date  . FRACTURE SURGERY    . HIP ARTHROPLASTY Left  05/01/2013   Procedure: ARTHROPLASTY BIPOLAR HIP;  Surgeon: Shelda Pal, MD;  Location: [facility];  Service: Orthopedics;  Laterality: Left;  . HIP FRACTURE SURGERY Right 2014    No Known Allergies  Outpatient Encounter Medications as of 12/17/2017  Medication Sig  . acetaminophen (TYLENOL) 325 MG tablet Take 650 mg by mouth See admin instructions. Take q12h prn for fever.  Take TID for pain management.  . ALPRAZolam (XANAX) 0.25 MG tablet Take 0.25 mg by mouth daily. Take at 1200  . Amino Acids-Protein Hydrolys (FEEDING SUPPLEMENT, PRO-STAT SUGAR FREE 64,) LIQD Take 30 mLs by mouth 2 (two) times daily.  Marland Kitchen amLODipine (NORVASC) 5 MG tablet Take 5 mg by mouth daily.   . busPIRone (BUSPAR) 7.5 MG tablet Take 7.5 mg by mouth 3 (three) times daily.   . Cranberry-Vitamin C-Inulin (UTI-STAT) LIQD Take 30 mLs by mouth daily. For UTI prophylaxis  . hydrochlorothiazide (MICROZIDE) 12.5 MG capsule Take 12.5 mg by mouth daily.   Marland Kitchen loratadine (CLARITIN) 10 MG tablet Take 10 mg by mouth daily.  Marland Kitchen omeprazole (PRILOSEC) 20 MG capsule Take 20 mg by mouth 2 (two) times daily before a meal.   . phenazopyridine (URISTAT) 95 MG tablet Take 95 mg by mouth 3 (three) times daily.  . polyethylene glycol (MIRALAX / GLYCOLAX) packet Take 17 g by mouth daily. For constipation  . sennosides-docusate sodium (SENOKOT-S) 8.6-50 MG tablet Take 2 tablets by mouth 2 (two) times daily.  . Sodium Phosphates (FLEET ENEMA RE) Rectally as needed if no BM in 3 days  .  Sunscreens (GENERAL PROTECTION SUNSCREEN) LOTN Apply topically. Apply sun screen to affected areas topically as needed prior to sun exposure  . [DISCONTINUED] Cholecalciferol (VITAMIN D3) 50000 units TABS Take 1 tablet by mouth every 30 (thirty) days.  . [DISCONTINUED] ferrous sulfate 325 (65 FE) MG tablet Take 325 mg by mouth 2 (two) times daily with a meal. Monitor for constipation and report  . [DISCONTINUED] linaclotide (LINZESS) 145 MCG CAPS capsule Take 145 mcg by  mouth daily before breakfast.  . [DISCONTINUED] vitamin B-12 (CYANOCOBALAMIN) 1000 MCG tablet Take 1,000 mcg by mouth daily.  . [DISCONTINUED] vitamin C (ASCORBIC ACID) 500 MG tablet Take 500 mg by mouth 2 (two) times daily.   No facility-administered encounter medications on file as of 12/17/2017.     Review of Systems  Unable to obtain due to weakness     Immunization History  Administered Date(s) Administered  . Influenza-Unspecified 05/05/2015  . PPD Test 07/17/2013  . Pneumococcal Conjugate-13 03/04/2017   Pertinent  Health Maintenance Due  Topic Date Due  . INFLUENZA VACCINE  04/28/2018 (Originally 02/26/2018)  . PNA vac Low Risk Adult (2 of 2 - PPSV23) 03/04/2018   Fall Risk  03/04/2017  Falls in the past year? No    Vitals:   12/17/17 1000  BP: 122/77  Pulse: 82  Resp: 18  Temp: (!) 97.3 F (36.3 C)  TempSrc: Oral  SpO2: 97%  Weight: 127 lb 6.4 oz (57.8 kg)  Height:  (1.88 m)   Body mass index is 16.36 kg/m.  Physical Exam  GENERAL APPEARANCE: Cachectic SKIN:  Multiple ulcers: right heel, left heel, left lateral foot and sacral area MOUTH :  Lips are without lesions. Mouth is dry RESPIRATORY: Breathing is even & unlabored, BS CTAB, has O2 @ 3L/min via [place] CARDIAC: RRR, no murmur,no extra heart sounds, no edema GI: Abdomen soft, normal BS, no masses, no tenderness, no hepatomegaly, no splenomegaly GU:  Has foley catheter draining to urine bag with yellowish urine EXTREMITIES:  Able to move X 4 extremities, BLE with generalized weakness PSYCHIATRIC: . Affect and behavior are appropriate   Labs reviewed: Recent Labs    04/08/17 08/15/17 11/14/17  NA 139 140 136*  K 4.0 4.3 3.9  BUN 35* 34* 39*  CREATININE 1.3 1.0 1.1   Recent Labs    08/15/17  AST 15  ALT 7*  ALKPHOS 67   Recent Labs    04/08/17 08/15/17 11/14/17  WBC 9.1 5.7 8.2  NEUTROABS HGB 9.0* 9.2* 9.5*  HCT 28* 27* 29*  PLT 281 212 366   Lab Results  Component Value Date     TSH 1.10 11/14/2017    Lab Results  Component Value Date   CHOL 125 02/04/2014   HDL 39 02/04/2014   LDLCALC 75 02/04/2014   TRIG 53 02/04/2014    Assessment/Plan  1. Essential hypertension, benign - he has been refusing his medications and latest BP 88/57, will HCTZ and Amlodipine   2. CKD (chronic kidney disease) stage 2, GFR 60-89 ml/min - has poor oral intake and currently on hospice care Lab Results  Component Value Date   CREATININE 1.1 11/14/2017     3. Chronic indwelling Foley catheter - yellowish urine draining to urine bag, monitor for hematuria, foley catheter care daily   4. Dysphagia, unspecified type - assist with feeding, aspiration precautions, oral care BID   5. Severe protein-calorie malnutrition (HCC) - Body mass index is 16.36 kg/m., start Eldertonic  15 ml BID to increase appetite, continue Prostat BID, start Magic cup TID on trays   6. Chronic constipation - continue Senexon-S 2 tabs BID and Miralax 17 gm daily  7. Anxiety -  Mood is stable, Continue Xanax 0.25 mg Q 12PM  8. Chronic pain - will start on Roxanol 20 mg/ml give 5 mg = 0.25 ml SL Q 8 hours PRN     Family/ staff Communication: Discussed plan of care with resident.  Labs/tests ordered:  None  Goals of care:   Long-term care/Hospice   Kenard Gower, NP [facility] [phone] (Monday-Friday 8:00 a.m. - 5:00 p.m.) [phone] (after hours)

## 2017-12-27 DEATH — deceased
